# Patient Record
Sex: Male | Born: 1950 | Race: Asian | Hispanic: No | Marital: Married | Smoking: Never smoker
Health system: Southern US, Community
[De-identification: ages and names within clinical notes are randomized; demographics above are authoritative.]

---

## 2018-10-10 ENCOUNTER — Emergency Department (HOSPITAL_COMMUNITY): Payer: BC Managed Care – PPO

## 2018-10-10 ENCOUNTER — Encounter (HOSPITAL_COMMUNITY): Payer: Self-pay | Admitting: Emergency Medicine

## 2018-10-10 ENCOUNTER — Inpatient Hospital Stay (HOSPITAL_COMMUNITY)
Admission: EM | Admit: 2018-10-10 | Discharge: 2018-11-19 | DRG: 177 | Disposition: E | Payer: BC Managed Care – PPO | Attending: Internal Medicine | Admitting: Internal Medicine

## 2018-10-10 DIAGNOSIS — K5900 Constipation, unspecified: Secondary | ICD-10-CM | POA: Diagnosis not present

## 2018-10-10 DIAGNOSIS — Z23 Encounter for immunization: Secondary | ICD-10-CM

## 2018-10-10 DIAGNOSIS — F05 Delirium due to known physiological condition: Secondary | ICD-10-CM | POA: Diagnosis present

## 2018-10-10 DIAGNOSIS — U071 COVID-19: Secondary | ICD-10-CM | POA: Diagnosis not present

## 2018-10-10 DIAGNOSIS — R000 Tachycardia, unspecified: Secondary | ICD-10-CM | POA: Diagnosis not present

## 2018-10-10 DIAGNOSIS — E1165 Type 2 diabetes mellitus with hyperglycemia: Secondary | ICD-10-CM | POA: Diagnosis present

## 2018-10-10 DIAGNOSIS — J1289 Other viral pneumonia: Secondary | ICD-10-CM | POA: Diagnosis present

## 2018-10-10 DIAGNOSIS — J069 Acute upper respiratory infection, unspecified: Secondary | ICD-10-CM

## 2018-10-10 DIAGNOSIS — Z66 Do not resuscitate: Secondary | ICD-10-CM | POA: Diagnosis not present

## 2018-10-10 DIAGNOSIS — K6289 Other specified diseases of anus and rectum: Secondary | ICD-10-CM | POA: Diagnosis present

## 2018-10-10 DIAGNOSIS — F419 Anxiety disorder, unspecified: Secondary | ICD-10-CM | POA: Diagnosis present

## 2018-10-10 DIAGNOSIS — E11649 Type 2 diabetes mellitus with hypoglycemia without coma: Secondary | ICD-10-CM | POA: Diagnosis not present

## 2018-10-10 DIAGNOSIS — E43 Unspecified severe protein-calorie malnutrition: Secondary | ICD-10-CM | POA: Insufficient documentation

## 2018-10-10 DIAGNOSIS — E861 Hypovolemia: Secondary | ICD-10-CM | POA: Diagnosis present

## 2018-10-10 DIAGNOSIS — E871 Hypo-osmolality and hyponatremia: Secondary | ICD-10-CM | POA: Diagnosis present

## 2018-10-10 DIAGNOSIS — I5033 Acute on chronic diastolic (congestive) heart failure: Secondary | ICD-10-CM | POA: Diagnosis present

## 2018-10-10 DIAGNOSIS — E876 Hypokalemia: Secondary | ICD-10-CM | POA: Diagnosis present

## 2018-10-10 DIAGNOSIS — Z515 Encounter for palliative care: Secondary | ICD-10-CM | POA: Diagnosis not present

## 2018-10-10 DIAGNOSIS — Z6820 Body mass index (BMI) 20.0-20.9, adult: Secondary | ICD-10-CM

## 2018-10-10 DIAGNOSIS — E222 Syndrome of inappropriate secretion of antidiuretic hormone: Secondary | ICD-10-CM | POA: Diagnosis present

## 2018-10-10 DIAGNOSIS — R0600 Dyspnea, unspecified: Secondary | ICD-10-CM

## 2018-10-10 DIAGNOSIS — R0602 Shortness of breath: Secondary | ICD-10-CM

## 2018-10-10 DIAGNOSIS — R1084 Generalized abdominal pain: Secondary | ICD-10-CM

## 2018-10-10 DIAGNOSIS — R739 Hyperglycemia, unspecified: Secondary | ICD-10-CM | POA: Diagnosis present

## 2018-10-10 DIAGNOSIS — J80 Acute respiratory distress syndrome: Secondary | ICD-10-CM | POA: Diagnosis present

## 2018-10-10 LAB — COMPREHENSIVE METABOLIC PANEL
ALT: 19 U/L (ref 0–44)
AST: 22 U/L (ref 15–41)
Albumin: 3.8 g/dL (ref 3.5–5.0)
Alkaline Phosphatase: 62 U/L (ref 38–126)
Anion gap: 12 (ref 5–15)
BUN: 10 mg/dL (ref 8–23)
CO2: 22 mmol/L (ref 22–32)
Calcium: 8.8 mg/dL — ABNORMAL LOW (ref 8.9–10.3)
Chloride: 94 mmol/L — ABNORMAL LOW (ref 98–111)
Creatinine, Ser: 0.79 mg/dL (ref 0.61–1.24)
GFR calc Af Amer: >60 mL/min (ref >60)
GFR calc non Af Amer: >60 mL/min (ref >60)
Glucose, Bld: 311 mg/dL — ABNORMAL HIGH (ref 70–99)
Potassium: 3.5 mmol/L (ref 3.5–5.1)
Sodium: 128 mmol/L — ABNORMAL LOW (ref 135–145)
Total Bilirubin: 0.9 mg/dL (ref 0.3–1.2)
Total Protein: 7.3 g/dL (ref 6.5–8.1)

## 2018-10-10 LAB — CBC WITH DIFFERENTIAL/PLATELET
Abs Immature Granulocytes: 0.03 10*3/uL (ref 0.00–0.07)
Basophils Absolute: 0 10*3/uL (ref 0.0–0.1)
Basophils Relative: 0 %
Eosinophils Absolute: 0.1 10*3/uL (ref 0.0–0.5)
Eosinophils Relative: 1 %
HCT: 41.2 % (ref 39.0–52.0)
Hemoglobin: 13.9 g/dL (ref 13.0–17.0)
Immature Granulocytes: 0 %
Lymphocytes Relative: 10 %
Lymphs Abs: 1.2 10*3/uL (ref 0.7–4.0)
MCH: 30 pg (ref 26.0–34.0)
MCHC: 33.7 g/dL (ref 30.0–36.0)
MCV: 88.8 fL (ref 80.0–100.0)
Monocytes Absolute: 1 10*3/uL (ref 0.1–1.0)
Monocytes Relative: 8 %
Neutro Abs: 9.6 10*3/uL — ABNORMAL HIGH (ref 1.7–7.7)
Neutrophils Relative %: 81 %
Platelets: 183 10*3/uL (ref 150–400)
RBC: 4.64 MIL/uL (ref 4.22–5.81)
RDW: 13 % (ref 11.5–15.5)
WBC: 11.9 10*3/uL — ABNORMAL HIGH (ref 4.0–10.5)
nRBC: 0 % (ref 0.0–0.2)

## 2018-10-10 LAB — SARS CORONAVIRUS 2 BY RT PCR (HOSPITAL ORDER, PERFORMED IN ~~LOC~~ HOSPITAL LAB): SARS Coronavirus 2: POSITIVE — AB

## 2018-10-10 LAB — LACTIC ACID, PLASMA: Lactic Acid, Venous: 1.2 mmol/L (ref 0.5–1.9)

## 2018-10-10 MED ORDER — SODIUM CHLORIDE 0.9 % IV BOLUS
1000.0000 mL | Freq: Once | INTRAVENOUS | Status: AC
  Administered 2018-10-10: 23:00:00 1000 mL via INTRAVENOUS

## 2018-10-10 MED ORDER — ACETAMINOPHEN 500 MG PO TABS
1000.0000 mg | ORAL_TABLET | Freq: Once | ORAL | Status: AC
  Administered 2018-10-10: 21:00:00 1000 mg via ORAL
  Filled 2018-10-10: qty 2

## 2018-10-10 MED ORDER — SODIUM CHLORIDE 0.9% FLUSH: 3.0000 mL | Freq: Once | INTRAVENOUS | Status: DC

## 2018-10-10 NOTE — ED Notes (Signed)
EDP notified pt is positive for coronavirus

## 2018-10-10 NOTE — ED Provider Notes (Signed)
"Austin Page is a 68 y.o. male with no known past medical history who presents for evaluation of fever and cough x2-3 days.  Patient states that he is also had body aches, chills.  He states cough is not productive and denies any hemoptysis.  He states he has been taking some medication for fever but cannot recall the name of it.  Patient states he feels slightly better after taking medication but states fever returns and he feels more weak, tired.  He states he is not having any difficulty breathing.  He denies any history of COPD, asthma.  Patient states that he has a family member who was recently diagnosed with COVID-19.  He reports he has been around that family member.  He states that several of his family members are sick with similar symptoms.  Patient denies any chest pain, difficulty breathing, abdominal pain, nausea/vomiting, numbness/weakness of his arms or legs."  Austin Page was evaluated in Emergency Department on 10/11/2018 for the symptoms described in the history of present illness. He was evaluated in the context of the global COVID-19 pandemic, which necessitated consideration that the patient might be at risk for infection with the SARS-CoV-2 virus that causes COVID-19. Institutional protocols and algorithms that pertain to the evaluation of patients at risk for COVID-19 are in a state of rapid change based on information released by regulatory bodies including the CDC and federal and state organizations. These policies and algorithms were followed during the patient's care in the ED.  Physical Exam  BP (!) 151/90 (BP Location: Left Arm)   Pulse (!) 110   Temp 98.5 F (36.9 C) (Oral)   Resp 20   Ht 5\' 7"  (1.702 m)   Wt 72.6 kg   SpO2 95%   BMI 25.06 kg/m   Physical Exam Vitals signs and nursing note reviewed.  Constitutional:      General: He is not in acute distress.    Appearance: He is well-developed. He is not toxic-appearing or diaphoretic.  HENT:     Head:  Normocephalic and atraumatic.  Neck:     Musculoskeletal: Normal range of motion and neck supple.  Cardiovascular:     Rate and Rhythm: Tachycardia present.  Pulmonary:     Effort: Pulmonary effort is normal. Tachypnea present.  Abdominal:     Tenderness: There is no abdominal tenderness.  Skin:    General: Skin is warm and dry.     Capillary Refill: Capillary refill takes less than 2 seconds.  Neurological:     Mental Status: He is alert and oriented to person, place, and time.     Cranial Nerves: No cranial nerve deficit.  Psychiatric:        Behavior: Behavior normal.    ED Course/Procedures     Procedures  MDM   68 year old male with no known past medical history presenting for fever and cough for 2 to 3 days.  The patient was received a signout from [facility] pending fluid resuscitation and reevaluation.  Please see her note for further work-up and medical decision making.  The patient was febrile to 103.1 on arrival with a heart rate in the 130s.  Fever resolved, but the patient continued to remain tachycardic.  He was given 1 L of fluids with no improvement in his heart rate.  Recheck of rectal temp is normal, but tachycardia persists.  Given hypercoagulable state of COVID-19 with persistent tachycardia, PE study was obtained, which was negative for pulmonary embolism.  On re-evaluation, patient is tachycardic in the 120s with tachypnea although he denies shortness of breath when using a Hindi interpreter.  The patient was discussed with Dr. Clayborne DanaMesner, attending physician.  Given the patient's age and vital signs, will consult the hospitalist team for admission. Spoke with Dr. Antionette Charpyd who will admit. The patient appears reasonably stabilized for admission considering the current resources, flow, and capabilities available in the ED at this time, and I doubt any other [facility] requiring further screening and/or treatment in the ED prior to admission.     Barkley BoardsMcDonald, Mia A, PA-C 10/11/18  [id number]    Marily MemosMesner, Jason, MD 10/11/18 [phone]

## 2018-10-10 NOTE — Discharge Instructions (Signed)
Person Under Monitoring Name: Austin Page  Location: [address] [place] [address]   Infection Prevention Recommendations for Individuals Confirmed to have, or Being Evaluated for, 2019 Novel Coronavirus (COVID-19) Infection Who Receive Care at Home  Individuals who are confirmed to have, or are being evaluated for, COVID-19 should follow the prevention steps below until a healthcare provider or local or state health department says they can return to normal activities.  Stay home except to get medical care You should restrict activities outside your home, except for getting medical care. Do not go to work, school, or public areas, and do not use public transportation or taxis.  Call ahead before visiting your doctor Before your medical appointment, call the healthcare provider and tell them that you have, or are being evaluated for, COVID-19 infection. This will help the healthcare providers office take steps to keep other people from getting infected. Ask your healthcare provider to call the local or state health department.  Monitor your symptoms Seek prompt medical attention if your illness is worsening (e.g., difficulty breathing). Before going to your medical appointment, call the healthcare provider and tell them that you have, or are being evaluated for, COVID-19 infection. Ask your healthcare provider to call the local or state health department.  Wear a facemask You should wear a facemask that covers your nose and mouth when you are in the same room with other people and when you visit a healthcare provider. People who live with or visit you should also wear a facemask while they are in the same room with you.  Separate yourself from other people in your home As much as possible, you should stay in a different room from other people in your home. Also, you should use a separate bathroom, if available.  Avoid sharing household items You should not share  dishes, drinking glasses, cups, eating utensils, towels, bedding, or other items with other people in your home. After using these items, you should wash them thoroughly with soap and water.  Cover your coughs and sneezes Cover your mouth and nose with a tissue when you cough or sneeze, or you can cough or sneeze into your sleeve. Throw used tissues in a lined trash can, and immediately wash your hands with soap and water for at least 20 seconds or use an alcohol-based hand rub.  Wash your [employer] your hands often and thoroughly with soap and water for at least 20 seconds. You can use an alcohol-based hand sanitizer if soap and water are not available and if your hands are not visibly dirty. Avoid touching your eyes, nose, and mouth with unwashed hands.   Prevention Steps for Caregivers and Household Members of Individuals Confirmed to have, or Being Evaluated for, COVID-19 Infection Being Cared for in the Home  If you live with, or provide care at home for, a person confirmed to have, or being evaluated for, COVID-19 infection please follow these guidelines to prevent infection:  Follow healthcare providers instructions Make sure that you understand and can help the patient follow any healthcare provider instructions for all care.  Provide for the patients basic needs You should help the patient with basic needs in the home and provide support for getting groceries, prescriptions, and other personal needs.  Monitor the patients symptoms If they are getting sicker, call his or her medical provider and tell them that the patient has, or is being evaluated for, COVID-19 infection. This will help the healthcare providers office  take steps to keep other people from getting infected. Ask the healthcare provider to call the local or state health department.  Limit the number of people who have contact with the patient  If possible, have only one caregiver for the patient.  Other  household members should stay in another home or place of residence. If this is not possible, they should stay  in another room, or be separated from the patient as much as possible. Use a separate bathroom, if available.  Restrict visitors who do not have an essential need to be in the home.  Keep older adults, very young children, and other sick people away from the patient Keep older adults, very young children, and those who have compromised immune systems or chronic health conditions away from the patient. This includes people with chronic heart, lung, or kidney conditions, diabetes, and cancer.  Ensure good ventilation Make sure that shared spaces in the home have good air flow, such as from an air conditioner or an opened window, weather permitting.  Wash your hands often  Wash your hands often and thoroughly with soap and water for at least 20 seconds. You can use an alcohol based hand sanitizer if soap and water are not available and if your hands are not visibly dirty.  Avoid touching your eyes, nose, and mouth with unwashed hands.  Use disposable paper towels to dry your hands. If not available, use dedicated cloth towels and replace them when they become wet.  Wear a facemask and gloves  Wear a disposable facemask at all times in the room and gloves when you touch or have contact with the patients blood, body fluids, and/or secretions or excretions, such as sweat, saliva, sputum, nasal mucus, vomit, urine, or feces.  Ensure the mask fits over your nose and mouth tightly, and do not touch it during use.  Throw out disposable facemasks and gloves after using them. Do not reuse.  Wash your hands immediately after removing your facemask and gloves.  If your personal clothing becomes contaminated, carefully remove clothing and launder. Wash your hands after handling contaminated clothing.  Place all used disposable facemasks, gloves, and other waste in a lined container before  disposing them with other household waste.  Remove gloves and wash your hands immediately after handling these items.  Do not share dishes, glasses, or other household items with the patient  Avoid sharing household items. You should not share dishes, drinking glasses, cups, eating utensils, towels, bedding, or other items with a patient who is confirmed to have, or being evaluated for, COVID-19 infection.  After the person uses these items, you should wash them thoroughly with soap and water.  Wash laundry thoroughly  Immediately remove and wash clothes or bedding that have blood, body fluids, and/or secretions or excretions, such as sweat, saliva, sputum, nasal mucus, vomit, urine, or feces, on them.  Wear gloves when handling laundry from the patient.  Read and follow directions on labels of laundry or clothing items and detergent. In general, wash and dry with the warmest temperatures recommended on the label.  Clean all areas the individual has used often  Clean all touchable surfaces, such as counters, tabletops, doorknobs, bathroom fixtures, toilets, phones, keyboards, tablets, and bedside tables, every day. Also, clean any surfaces that may have blood, body fluids, and/or secretions or excretions on them.  Wear gloves when cleaning surfaces the patient has come in contact with.  Use a diluted bleach solution (e.g., dilute bleach with 1 part  bleach and 10 parts water) or a household disinfectant with a label that says EPA-registered for coronaviruses. To make a bleach solution at home, add 1 tablespoon of bleach to 1 quart (4 cups) of water. For a larger supply, add  cup of bleach to 1 gallon (16 cups) of water.  Read labels of cleaning products and follow recommendations provided on product labels. Labels contain instructions for safe and effective use of the cleaning product including precautions you should take when applying the product, such as wearing gloves or eye protection  and making sure you have good ventilation during use of the product.  Remove gloves and wash hands immediately after cleaning.  Monitor yourself for signs and symptoms of illness Caregivers and household members are considered close contacts, should monitor their health, and will be asked to limit movement outside of the home to the extent possible. Follow the monitoring steps for close contacts listed on the symptom monitoring form.   ? If you have additional questions, contact your local health department or call the epidemiologist on call at [phone] (available 24/7). ? This guidance is subject to change. For the most up-to-date guidance from [facility], please refer to their website: [email or web address]

## 2018-10-10 NOTE — ED Triage Notes (Signed)
Pt in POV with other family members, reports COVID like S/S X2 days. Cough/fever/chills. States they have been around a family member that passed away from COVID this AM. Tachycardic and febrile in triage.

## 2018-10-10 NOTE — ED Provider Notes (Signed)
[facility] EMERGENCY DEPARTMENT Provider Note   CSN: [id number] Arrival date & time: Oct 20, 2018  2009    History   Chief Complaint Chief Complaint  Patient presents with  . Cough    HPI Austin Page is a 68 y.o. male with no known past medical history who presents for evaluation of fever and cough x2-3 days.  Patient states that he is also had body aches, chills.  He states cough is not productive and denies any hemoptysis.  He states he has been taking some medication for fever but cannot recall the name of it.  Patient states he feels slightly better after taking medication but states fever returns and he feels more weak, tired.  He states he is not having any difficulty breathing.  He denies any history of COPD, asthma.  Patient states that he has a family member who was recently diagnosed with COVID-19.  He reports he has been around that family member.  He states that several of his family members are sick with similar symptoms.  Patient denies any chest pain, difficulty breathing, abdominal pain, nausea/vomiting, numbness/weakness of his arms or legs.     The history is provided by the patient. A language interpreter was used.    History reviewed. No pertinent past medical history.  Patient Active Problem List   Diagnosis Date Noted  . Acute respiratory disease due to COVID-19 virus 10/11/2018  . Hyperglycemia 10/11/2018  . Hyponatremia 10/11/2018    History reviewed. No pertinent surgical history.      Home Medications    Prior to Admission medications   Not on File    Family History History reviewed. No pertinent family history.  Social History Social History   Tobacco Use  . Smoking status: Never Smoker  . Smokeless tobacco: Never Used  Substance Use Topics  . Alcohol use: Not Currently  . Drug use: Not Currently     Allergies   Patient has no known allergies.   Review of Systems Review of Systems  Constitutional: Positive  for fatigue and fever.  Respiratory: Positive for cough. Negative for shortness of breath.   Cardiovascular: Negative for chest pain.  Gastrointestinal: Negative for abdominal pain, nausea and vomiting.  Genitourinary: Negative for dysuria and hematuria.  Musculoskeletal: Positive for myalgias.  Neurological: Negative for headaches.  All other systems reviewed and are negative.    Physical Exam Updated Vital Signs BP (!) 156/91 (BP Location: Left Arm)   Pulse (!) 131   Temp (!) 102.3 F (39.1 C) (Oral)   Resp (!) 33   Ht  (1.702 m)   Wt 72.6 kg   SpO2 91%   BMI 25.06 kg/m   Physical Exam Vitals signs and nursing note reviewed.  Constitutional:      Appearance: Normal appearance. He is well-developed.  HENT:     Head: Normocephalic and atraumatic.  Eyes:     General: Lids are normal.     Conjunctiva/sclera: Conjunctivae normal.     Pupils: Pupils are equal, round, and reactive to light.  Neck:     Musculoskeletal: Full passive range of motion without pain.  Cardiovascular:     Rate and Rhythm: Regular rhythm. Tachycardia present.     Pulses: Normal pulses.          Radial pulses are 2+ on the right side and 2+ on the left side.       Dorsalis pedis pulses are 2+ on the right side and 2+ on  the left side.     Heart sounds: Normal heart sounds. No murmur. No friction rub. No gallop.   Pulmonary:     Effort: Pulmonary effort is normal.     Breath sounds: Normal breath sounds.     Comments: Lungs clear to auscultation bilaterally.  Symmetric chest rise.  No wheezing, rales, rhonchi. Abdominal:     Palpations: Abdomen is soft. Abdomen is not rigid.     Tenderness: There is no abdominal tenderness. There is no guarding.     Comments: Abdomen is soft, non-distended, non-tender. No rigidity, No guarding. No peritoneal signs.  Musculoskeletal: Normal range of motion.     Comments: BLE are symmetric in appearance without any overlying warmth, erythema or edema.   Skin:     General: Skin is warm and dry.     Capillary Refill: Capillary refill takes less than 2 seconds.  Neurological:     Mental Status: He is alert and oriented to person, place, and time.  Psychiatric:        Speech: Speech normal.      ED Treatments / Results  Labs (all labs ordered are listed, but only abnormal results are displayed) Labs Reviewed  SARS CORONAVIRUS 2 (HOSPITAL ORDER, PERFORMED IN [facility]) - Abnormal; Notable for the following components:      Result Value   SARS Coronavirus 2 POSITIVE (*)    All other components within normal limits  COMPREHENSIVE METABOLIC PANEL - Abnormal; Notable for the following components:   Sodium 128 (*)    Chloride 94 (*)    Glucose, Bld 311 (*)    Calcium 8.8 (*)    All other components within normal limits  CBC WITH DIFFERENTIAL/PLATELET - Abnormal; Notable for the following components:   WBC 11.9 (*)    Neutro Abs 9.6 (*)    All other components within normal limits  LACTATE DEHYDROGENASE - Abnormal; Notable for the following components:   LDH 226 (*)    All other components within normal limits  C-REACTIVE PROTEIN - Abnormal; Notable for the following components:   CRP 10.8 (*)    All other components within normal limits  FIBRINOGEN - Abnormal; Notable for the following components:   Fibrinogen 547 (*)    All other components within normal limits  HEMOGLOBIN A1C - Abnormal; Notable for the following components:   Hgb A1c MFr Bld 10.7 (*)    All other components within normal limits  CBG MONITORING, ED - Abnormal; Notable for the following components:   Glucose-Capillary 216 (*)    All other components within normal limits  CULTURE, BLOOD (ROUTINE X 2)  CULTURE, BLOOD (ROUTINE X 2)  LACTIC ACID, PLASMA  PROCALCITONIN  D-DIMER, QUANTITATIVE (NOT AT [facility])  HIV ANTIBODY (ROUTINE TESTING W REFLEX)  INTERLEUKIN-6, PLASMA  SODIUM, URINE, RANDOM  OSMOLALITY, URINE  ABO/RH    EKG EKG Interpretation   Date/Time:  Saturday Oct 11 2018 05:01:00 EDT Ventricular Rate:  118 PR Interval:    QRS Duration: 83 QT Interval:  321 QTC Calculation: 450 R Axis:   -51 Text Interpretation:  Sinus tachycardia Left anterior fascicular block Abnormal R-wave progression, late transition Left ventricular hypertrophy No old tracing to compare Confirmed by Marily Memos [phone]) on 10/11/2018 5:05:49 AM Also confirmed by Marily Memos [phone]), editor Elita Quick [phone])  on 10/11/2018 1:46:01 PM   Radiology Ct Angio Chest Pe W And/or Wo Contrast  Result Date: 10/11/2018 CLINICAL DATA:  68 y/o  M; cough, fever,  chills.  COVID-19 positive. EXAM: CT ANGIOGRAPHY CHEST WITH CONTRAST TECHNIQUE: Multidetector CT imaging of the chest was performed using the standard protocol during bolus administration of intravenous contrast. Multiplanar CT image reconstructions and MIPs were obtained to evaluate the vascular anatomy. CONTRAST:  OMNIPAQUE IOHEXOL 300 MG/ML  SOLN COMPARISON:  10/08/2018 chest radiograph FINDINGS: Cardiovascular: Mild respiratory motion artifact. Satisfactory opacification of the pulmonary arteries to the segmental level. No evidence of pulmonary embolism. Normal heart size. No pericardial effusion. Mild aortic and moderate coronary artery calcific atherosclerosis. Mediastinum/Nodes: No enlarged mediastinal, hilar, or axillary lymph nodes. Thyroid gland, trachea, and esophagus demonstrate no significant findings. Lungs/Pleura: Multiple peripheral ground-glass opacities with the largest confluent opacity in the right upper lobe. The right upper lobe demonstrates intra lobular septal thickening "crazy paving" pattern. No pleural effusion or pneumothorax. Upper Abdomen: No acute abnormality. Musculoskeletal: No chest wall abnormality. No acute or significant osseous findings. Review of the MIP images confirms the above findings. IMPRESSION: 1. Mild respiratory motion artifact. No pulmonary embolus  identified. 2. There are a spectrum of findings in the lungs which can be seen with acute atypical infection (as well as other non-infectious etiologies). In particular, viral pneumonia (including COVID-19) should be considered in the appropriate clinical setting. Critical Value/emergent results were called by telephone at the time of interpretation on 10/11/2018 at 3:08 am to PA MIA [facility] , who verbally acknowledged these results. Electronically Signed   By: Mitzi Hansen M.D.   On: 10/11/2018 03:21   Dg Chest Portable 1 View  Result Date: 09/23/2018 CLINICAL DATA:  Symptoms of possible COVID-19 EXAM: PORTABLE CHEST 1 VIEW COMPARISON:  None. FINDINGS: The heart size and mediastinal contours are within normal limits. Both lungs are clear. The visualized skeletal structures are unremarkable. IMPRESSION: No active disease. Electronically Signed   By: Deatra Robinson M.D.   On: 09/19/2018 22:55    Procedures Procedures (including critical care time)  Medications Ordered in ED Medications  sodium chloride flush (NS) 0.9 % injection 3 mL (3 mLs Intravenous Not Given 10/15/2018 2145)  enoxaparin (LOVENOX) injection 40 mg (has no administration in time range)  sodium chloride flush (NS) 0.9 % injection 3 mL (has no administration in time range)  sodium chloride flush (NS) 0.9 % injection 3 mL (has no administration in time range)  sodium chloride flush (NS) 0.9 % injection 3 mL (has no administration in time range)  0.9 %  sodium chloride infusion (has no administration in time range)  acetaminophen (TYLENOL) tablet 650 mg (650 mg Oral Given 10/11/18 1412)  HYDROcodone-acetaminophen (NORCO/VICODIN) 5-325 MG per tablet 1 tablet (1 tablet Oral Given 10/11/18 0533)  senna-docusate (Senokot-S) tablet 1 tablet (has no administration in time range)  ondansetron (ZOFRAN) tablet 4 mg ( Oral See Alternative 10/11/18 0533)    Or  ondansetron (ZOFRAN) injection 4 mg (4 mg Intravenous Given 10/11/18 0533)   insulin aspart (novoLOG) injection 0-9 Units (0 Units Subcutaneous Not Given 10/11/18 1603)  insulin aspart (novoLOG) injection 0-5 Units (has no administration in time range)  acetaminophen (TYLENOL) tablet 1,000 mg (1,000 mg Oral Given 09/21/2018 2045)  sodium chloride 0.9 % bolus 1,000 mL (0 mLs Intravenous Stopped 10/11/18 0153)  iohexol (OMNIPAQUE) 300 MG/ML solution 100 mL (100 mLs Intravenous Contrast Given 10/11/18 0257)  sodium chloride 0.9 % bolus 500 mL (0 mLs Intravenous Stopped 10/11/18 0827)     Initial Impression / Assessment and Plan / ED Course  I have reviewed the triage vital signs and the nursing  notes.  Pertinent labs & imaging results that were available during my care of the patient were reviewed by me and considered in my medical decision making (see chart for details).        68 year old male no significant past medical history who presents for evaluation of fevers and cough.  Reports recent COVID exposure.  No difficulty breathing.  No chest pain.  On initial ED arrival, he is febrile, tachycardic, hypertensive.  Lungs clear to auscultation.  No evidence of respiratory distress.  Clinically, concern for COVID versus infectious etiology.  Lactic acid is negative.  CBC shows leukocytosis of 11.9.  CMP shows sodium of 128, glucose of 311. Patient with no history of diabetes.  He is COVID positive.  Chest x-ray negative for any acute abnormality.  Updated patient on results. He denies any SOB.  Temperature has improved here in ED.  Tachycardia has improved he still slightly tachycardic. He has only received about half a liter of fluids.  Patient will receive rest of fluids and reassess.   Patient signed out to Frederik PearMia McDonald, PA-C with re-assessment pending. Please see her note for further ED course.   Portions of this note were generated with Scientist, clinical (histocompatibility and immunogenetics)Dragon dictation software. Dictation errors may occur despite best attempts at proofreading.    Final Clinical Impressions(s) / ED  Diagnoses   Final diagnoses:  COVID-19    ED Discharge Orders    None       Maxwell CaulLayden, Jaeline Whobrey A, PA-C 10/11/18 1607    Shaune PollackIsaacs, Cameron, MD 10/13/18 1024

## 2018-10-11 ENCOUNTER — Other Ambulatory Visit: Payer: Self-pay

## 2018-10-11 ENCOUNTER — Emergency Department (HOSPITAL_COMMUNITY): Payer: BC Managed Care – PPO

## 2018-10-11 ENCOUNTER — Encounter (HOSPITAL_COMMUNITY): Payer: Self-pay | Admitting: Family Medicine

## 2018-10-11 DIAGNOSIS — J069 Acute upper respiratory infection, unspecified: Secondary | ICD-10-CM | POA: Diagnosis not present

## 2018-10-11 DIAGNOSIS — I5033 Acute on chronic diastolic (congestive) heart failure: Secondary | ICD-10-CM | POA: Diagnosis not present

## 2018-10-11 DIAGNOSIS — R739 Hyperglycemia, unspecified: Secondary | ICD-10-CM | POA: Diagnosis not present

## 2018-10-11 DIAGNOSIS — E871 Hypo-osmolality and hyponatremia: Secondary | ICD-10-CM | POA: Diagnosis not present

## 2018-10-11 DIAGNOSIS — J80 Acute respiratory distress syndrome: Secondary | ICD-10-CM | POA: Diagnosis present

## 2018-10-11 DIAGNOSIS — F05 Delirium due to known physiological condition: Secondary | ICD-10-CM | POA: Diagnosis present

## 2018-10-11 DIAGNOSIS — U071 COVID-19: Secondary | ICD-10-CM | POA: Diagnosis present

## 2018-10-11 DIAGNOSIS — Z66 Do not resuscitate: Secondary | ICD-10-CM | POA: Diagnosis not present

## 2018-10-11 DIAGNOSIS — R0600 Dyspnea, unspecified: Secondary | ICD-10-CM | POA: Diagnosis not present

## 2018-10-11 DIAGNOSIS — Z23 Encounter for immunization: Secondary | ICD-10-CM | POA: Diagnosis not present

## 2018-10-11 DIAGNOSIS — E222 Syndrome of inappropriate secretion of antidiuretic hormone: Secondary | ICD-10-CM | POA: Diagnosis present

## 2018-10-11 DIAGNOSIS — E861 Hypovolemia: Secondary | ICD-10-CM | POA: Diagnosis not present

## 2018-10-11 DIAGNOSIS — J988 Other specified respiratory disorders: Secondary | ICD-10-CM | POA: Diagnosis not present

## 2018-10-11 DIAGNOSIS — F419 Anxiety disorder, unspecified: Secondary | ICD-10-CM | POA: Diagnosis not present

## 2018-10-11 DIAGNOSIS — J1289 Other viral pneumonia: Secondary | ICD-10-CM | POA: Diagnosis not present

## 2018-10-11 DIAGNOSIS — K6289 Other specified diseases of anus and rectum: Secondary | ICD-10-CM | POA: Diagnosis not present

## 2018-10-11 DIAGNOSIS — Z515 Encounter for palliative care: Secondary | ICD-10-CM | POA: Diagnosis not present

## 2018-10-11 DIAGNOSIS — K5900 Constipation, unspecified: Secondary | ICD-10-CM | POA: Diagnosis not present

## 2018-10-11 DIAGNOSIS — Z6820 Body mass index (BMI) 20.0-20.9, adult: Secondary | ICD-10-CM | POA: Diagnosis not present

## 2018-10-11 DIAGNOSIS — J690 Pneumonitis due to inhalation of food and vomit: Secondary | ICD-10-CM | POA: Diagnosis not present

## 2018-10-11 DIAGNOSIS — E1165 Type 2 diabetes mellitus with hyperglycemia: Secondary | ICD-10-CM | POA: Diagnosis not present

## 2018-10-11 DIAGNOSIS — E43 Unspecified severe protein-calorie malnutrition: Secondary | ICD-10-CM | POA: Diagnosis present

## 2018-10-11 DIAGNOSIS — E11649 Type 2 diabetes mellitus with hypoglycemia without coma: Secondary | ICD-10-CM | POA: Diagnosis not present

## 2018-10-11 DIAGNOSIS — E876 Hypokalemia: Secondary | ICD-10-CM | POA: Diagnosis not present

## 2018-10-11 DIAGNOSIS — R000 Tachycardia, unspecified: Secondary | ICD-10-CM | POA: Diagnosis not present

## 2018-10-11 LAB — COMPREHENSIVE METABOLIC PANEL
ALT: 20 U/L (ref 0–44)
AST: 26 U/L (ref 15–41)
Albumin: 3.2 g/dL — ABNORMAL LOW (ref 3.5–5.0)
Alkaline Phosphatase: 57 U/L (ref 38–126)
Anion gap: 9 (ref 5–15)
BUN: 18 mg/dL (ref 8–23)
CO2: 22 mmol/L (ref 22–32)
Calcium: 8.5 mg/dL — ABNORMAL LOW (ref 8.9–10.3)
Chloride: 98 mmol/L (ref 98–111)
Creatinine, Ser: 0.83 mg/dL (ref 0.61–1.24)
GFR calc Af Amer: >60 mL/min (ref >60)
GFR calc non Af Amer: >60 mL/min (ref >60)
Glucose, Bld: 375 mg/dL — ABNORMAL HIGH (ref 70–99)
Potassium: 4 mmol/L (ref 3.5–5.1)
Sodium: 129 mmol/L — ABNORMAL LOW (ref 135–145)
Total Bilirubin: 0.6 mg/dL (ref 0.3–1.2)
Total Protein: 6.6 g/dL (ref 6.5–8.1)

## 2018-10-11 LAB — D-DIMER, QUANTITATIVE: D-Dimer, Quant: 0.46 ug/mL-FEU (ref 0.00–0.50)

## 2018-10-11 LAB — LIPID PANEL
Cholesterol: 147 mg/dL (ref 0–200)
HDL: 34 mg/dL — ABNORMAL LOW (ref >40)
LDL Cholesterol: 96 mg/dL (ref 0–99)
Total CHOL/HDL Ratio: 4.3 RATIO
Triglycerides: 86 mg/dL (ref <150)
VLDL: 17 mg/dL (ref 0–40)

## 2018-10-11 LAB — HIV ANTIBODY (ROUTINE TESTING W REFLEX): HIV Screen 4th Generation wRfx: NONREACTIVE

## 2018-10-11 LAB — CBG MONITORING, ED: Glucose-Capillary: 216 mg/dL — ABNORMAL HIGH (ref 70–99)

## 2018-10-11 LAB — ABO/RH: ABO/RH(D): B POS

## 2018-10-11 LAB — GLUCOSE, CAPILLARY
Glucose-Capillary: 236 mg/dL — ABNORMAL HIGH (ref 70–99)
Glucose-Capillary: 285 mg/dL — ABNORMAL HIGH (ref 70–99)

## 2018-10-11 LAB — FIBRINOGEN: Fibrinogen: 547 mg/dL — ABNORMAL HIGH (ref 210–475)

## 2018-10-11 LAB — PROCALCITONIN: Procalcitonin: 0.12 ng/mL

## 2018-10-11 LAB — HEMOGLOBIN A1C
Hgb A1c MFr Bld: 10.7 % — ABNORMAL HIGH (ref 4.8–5.6)
Mean Plasma Glucose: 260.39 mg/dL

## 2018-10-11 LAB — LACTATE DEHYDROGENASE: LDH: 226 U/L — ABNORMAL HIGH (ref 98–192)

## 2018-10-11 LAB — MAGNESIUM: Magnesium: 1.8 mg/dL (ref 1.7–2.4)

## 2018-10-11 LAB — C-REACTIVE PROTEIN: CRP: 10.8 mg/dL — ABNORMAL HIGH (ref <1.0)

## 2018-10-11 MED ORDER — SENNOSIDES-DOCUSATE SODIUM 8.6-50 MG PO TABS
1.0000 | ORAL_TABLET | Freq: Every evening | ORAL | Status: DC | PRN
  Administered 2018-10-31 – 2018-11-01 (×2): 1 via ORAL
  Filled 2018-10-11 (×2): qty 1

## 2018-10-11 MED ORDER — SODIUM CHLORIDE 0.9% FLUSH: 3.0000 mL | Freq: Two times a day (BID) | INTRAVENOUS | Status: DC

## 2018-10-11 MED ORDER — SODIUM CHLORIDE 0.9% FLUSH: 3.0000 mL | INTRAVENOUS | Status: DC | PRN

## 2018-10-11 MED ORDER — SODIUM CHLORIDE 0.9 % IV SOLN
INTRAVENOUS | Status: DC
  Administered 2018-10-11 – 2018-10-12 (×2): via INTRAVENOUS

## 2018-10-11 MED ORDER — INSULIN ASPART 100 UNIT/ML ~~LOC~~ SOLN
0.0000 [IU] | Freq: Every day | SUBCUTANEOUS | Status: DC
  Administered 2018-10-11: 5 [IU] via SUBCUTANEOUS

## 2018-10-11 MED ORDER — ENOXAPARIN SODIUM 40 MG/0.4ML ~~LOC~~ SOLN
40.0000 mg | SUBCUTANEOUS | Status: DC
  Administered 2018-10-11 – 2018-10-12 (×2): 40 mg via SUBCUTANEOUS
  Filled 2018-10-11 (×2): qty 0.4

## 2018-10-11 MED ORDER — PNEUMOCOCCAL VAC POLYVALENT 25 MCG/0.5ML IJ INJ
0.5000 mL | INJECTION | INTRAMUSCULAR | Status: AC
  Administered 2018-10-12: 10:00:00 0.5 mL via INTRAMUSCULAR
  Filled 2018-10-11: qty 0.5

## 2018-10-11 MED ORDER — INSULIN ASPART 100 UNIT/ML ~~LOC~~ SOLN
0.0000 [IU] | SUBCUTANEOUS | Status: DC
  Administered 2018-10-11: 22:00:00 0 [IU] via SUBCUTANEOUS
  Administered 2018-10-12 (×2): 5 [IU] via SUBCUTANEOUS
  Administered 2018-10-12: 16:00:00 11 [IU] via SUBCUTANEOUS
  Administered 2018-10-12: 8 [IU] via SUBCUTANEOUS
  Administered 2018-10-12: 09:00:00 3 [IU] via SUBCUTANEOUS
  Administered 2018-10-12: 5 [IU] via SUBCUTANEOUS
  Administered 2018-10-13 (×3): 3 [IU] via SUBCUTANEOUS
  Administered 2018-10-13: 21:00:00 5 [IU] via SUBCUTANEOUS
  Administered 2018-10-13: 23:00:00 8 [IU] via SUBCUTANEOUS
  Administered 2018-10-14 (×2): 5 [IU] via SUBCUTANEOUS
  Administered 2018-10-14: 17:00:00 8 [IU] via SUBCUTANEOUS
  Administered 2018-10-14: 3 [IU] via SUBCUTANEOUS
  Administered 2018-10-14: 20:00:00 11 [IU] via SUBCUTANEOUS
  Administered 2018-10-14: 12:00:00 8 [IU] via SUBCUTANEOUS
  Administered 2018-10-15: 16:00:00 5 [IU] via SUBCUTANEOUS
  Administered 2018-10-15 (×2): 3 [IU] via SUBCUTANEOUS
  Administered 2018-10-15: 11 [IU] via SUBCUTANEOUS
  Administered 2018-10-15: 12:00:00 8 [IU] via SUBCUTANEOUS
  Administered 2018-10-16: 3 [IU] via SUBCUTANEOUS
  Administered 2018-10-16: 12:00:00 5 [IU] via SUBCUTANEOUS
  Administered 2018-10-16 (×2): 2 [IU] via SUBCUTANEOUS
  Administered 2018-10-16: 20:00:00 15 [IU] via SUBCUTANEOUS
  Administered 2018-10-16: 3 [IU] via SUBCUTANEOUS

## 2018-10-11 MED ORDER — INSULIN ASPART 100 UNIT/ML ~~LOC~~ SOLN
0.0000 [IU] | Freq: Three times a day (TID) | SUBCUTANEOUS | Status: DC
  Administered 2018-10-11: 17:00:00 3 [IU] via SUBCUTANEOUS

## 2018-10-11 MED ORDER — SODIUM CHLORIDE 0.9% FLUSH
3.0000 mL | Freq: Two times a day (BID) | INTRAVENOUS | Status: DC
  Administered 2018-10-11 – 2018-10-20 (×19): 3 mL via INTRAVENOUS

## 2018-10-11 MED ORDER — HYDROCODONE-ACETAMINOPHEN 5-325 MG PO TABS
1.0000 | ORAL_TABLET | ORAL | Status: DC | PRN
  Administered 2018-10-11 – 2018-10-24 (×6): 1 via ORAL
  Filled 2018-10-11 (×6): qty 1

## 2018-10-11 MED ORDER — SODIUM CHLORIDE 0.9 % IV BOLUS
500.0000 mL | Freq: Once | INTRAVENOUS | Status: AC
  Administered 2018-10-11: 500 mL via INTRAVENOUS

## 2018-10-11 MED ORDER — ONDANSETRON HCL 4 MG PO TABS: 4.0000 mg | ORAL_TABLET | Freq: Four times a day (QID) | ORAL | Status: DC | PRN

## 2018-10-11 MED ORDER — IOHEXOL 300 MG/ML  SOLN
100.0000 mL | Freq: Once | INTRAMUSCULAR | Status: AC | PRN
  Administered 2018-10-11: 100 mL via INTRAVENOUS

## 2018-10-11 MED ORDER — METOPROLOL TARTRATE 5 MG/5ML IV SOLN
2.5000 mg | Freq: Four times a day (QID) | INTRAVENOUS | Status: DC
  Administered 2018-10-11 – 2018-10-12 (×3): 2.5 mg via INTRAVENOUS
  Filled 2018-10-11 (×3): qty 5

## 2018-10-11 MED ORDER — ACETAMINOPHEN 325 MG PO TABS
650.0000 mg | ORAL_TABLET | Freq: Four times a day (QID) | ORAL | Status: DC | PRN
  Administered 2018-10-11 – 2018-10-12 (×6): 650 mg via ORAL
  Filled 2018-10-11 (×7): qty 2

## 2018-10-11 MED ORDER — ENOXAPARIN SODIUM 40 MG/0.4ML ~~LOC~~ SOLN
40.0000 mg | SUBCUTANEOUS | Status: DC
  Filled 2018-10-11: qty 0.4

## 2018-10-11 MED ORDER — SODIUM CHLORIDE 0.9 % IV SOLN
250.0000 mL | INTRAVENOUS | Status: DC | PRN
  Administered 2018-10-15: 15:00:00 250 mL via INTRAVENOUS

## 2018-10-11 MED ORDER — ONDANSETRON HCL 4 MG/2ML IJ SOLN
4.0000 mg | Freq: Four times a day (QID) | INTRAMUSCULAR | Status: DC | PRN
  Administered 2018-10-11 – 2018-10-26 (×2): 4 mg via INTRAVENOUS
  Filled 2018-10-11 (×3): qty 2

## 2018-10-11 NOTE — ED Notes (Signed)
Breakfast tray ordered for pt

## 2018-10-11 NOTE — H&P (Signed)
History and Physical    Austin Page FPO:[id number] DOB: 1950-08-31 DOA: 09/23/2018  PCP: Patient, [name]   Patient coming from: Home   Chief Complaint: Fever, cough, N/V, gen weakness   HPI: Austin Page is a 68 y.o. male who denies any significant past medical history and now presents for evaluation of fevers, chills, cough, nausea with nonbloody vomiting, and generalized weakness.  Patient has had contact with a family member with confirmed COVID-19 and reports approximately 3 days of fevers, chills, nonproductive cough, general malaise, and nausea with nonbloody vomiting.  He denies any chest pain, leg swelling or tenderness, or rash.  He has taken some antipyretics at home without much relief.  ED Course: Upon arrival to the ED, patient is found to be febrile to 39.5 C, saturating mid 90s on room air, tachypneic in the mid 30s, tachycardic in the 130s, and with stable blood pressure.  Chest x-ray is negative for acute cardiopulmonary disease but CTA chest, while negative for PE, demonstrates multiple peripheral groundglass opacities concerning for viral pneumonia in this setting.  Chemistry panel features a glucose of 311 and sodium 128.  CBC is notable for a leukocytosis to 11,900.  COVID-19 testing is positive.  Lactic acid is reassuringly normal.  Patient was given a liter of normal saline and acetaminophen in the ED.  He has not been hypoxic, but has increased work of breathing and tachypnea in the 30s while at rest, remains tachycardic despite fluid resuscitation and antipyretics, and he will be admitted for ongoing evaluation and  Review of Systems:  All other systems reviewed and apart from HPI, are negative.  History reviewed. No pertinent past medical history.  History reviewed. No pertinent surgical history.   reports that he has never smoked. He has never used smokeless tobacco. He reports previous alcohol use. He reports previous drug use.  No Known Allergies   History reviewed. No pertinent family history.   Prior to Admission medications   Not on File    Physical Exam: Vitals:   10/11/18 0215 10/11/18 0230 10/11/18 0500 10/11/18 0501  BP: (!) 153/94 (!) 158/94 (!) 163/90 (!) 163/90  Pulse: (!) 108  (!) 119 (!) 120  Resp: (!) 28 (!) 27 (!) 25 (!) 31  Temp:    (!) 102.1 F (38.9 C)  TempSrc:    Oral  SpO2: 97%  95% 94%  Weight:      Height:        Constitutional: Tachypneic, appears uncomfortable, no diaphoresis   Eyes: PERTLA, lids and conjunctivae normal ENMT: Mucous membranes are moist. Posterior pharynx clear of any exudate or lesions.   Neck: normal, supple, no masses, no thyromegaly Respiratory: Coarse rales bilaterally. Tachypnea. Dyspnea with speech. Increased WOB. No pallor or cyanosis.   Cardiovascular: Rate ~125 and regular. No extremity edema.  Abdomen: No distension, no tenderness, soft. Bowel sounds active.  Musculoskeletal: no clubbing / cyanosis. No joint deformity upper and lower extremities.   Skin: no significant rashes, lesions, ulcers. Warm, dry, well-perfused. Neurologic: CN 2-12 grossly intact. Sensation intact. Moving all extremities.  Psychiatric: Alert and oriented to person, place, and situation. Cooperative.    Labs on Admission: I have personally reviewed following labs and imaging studies  CBC: Recent Labs  Lab 09/19/2018 2039  WBC 11.9*  NEUTROABS 9.6*  HGB 13.9  HCT 41.2  MCV 88.8  PLT 183   Basic Metabolic Panel: Recent Labs  Lab 10/17/2018 2039  NA 128*  K 3.5  CL 94*  CO2 22  GLUCOSE 311*  BUN 10  CREATININE 0.79  CALCIUM 8.8*   GFR: Estimated Creatinine Clearance: 83.8 mL/min (by C-G formula based on SCr of 0.79 mg/dL). Liver Function Tests: Recent Labs  Lab 11-02-18 2039  AST 22  ALT 19  ALKPHOS 62  BILITOT 0.9  PROT 7.3  ALBUMIN 3.8   No results for input(s): LIPASE, AMYLASE in the last 168 hours. No results for input(s): AMMONIA in the last 168 hours. Coagulation  Profile: No results for input(s): INR, PROTIME in the last 168 hours. Cardiac Enzymes: No results for input(s): CKTOTAL, CKMB, CKMBINDEX, TROPONINI in the last 168 hours. BNP (last 3 results) No results for input(s): PROBNP in the last 8760 hours. HbA1C: No results for input(s): HGBA1C in the last 72 hours. CBG: No results for input(s): GLUCAP in the last 168 hours. Lipid Profile: No results for input(s): CHOL, HDL, LDLCALC, TRIG, CHOLHDL, LDLDIRECT in the last 72 hours. Thyroid Function Tests: No results for input(s): TSH, T4TOTAL, FREET4, T3FREE, THYROIDAB in the last 72 hours. Anemia Panel: No results for input(s): VITAMINB12, FOLATE, FERRITIN, TIBC, IRON, RETICCTPCT in the last 72 hours. Urine analysis: No results found for: COLORURINE, APPEARANCEUR, LABSPEC, PHURINE, GLUCOSEU, HGBUR, BILIRUBINUR, KETONESUR, PROTEINUR, UROBILINOGEN, NITRITE, LEUKOCYTESUR Sepsis Labs: (procalcitonin:4,lacticidven:4) ) Recent Results (from the past 240 hour(s))  SARS Coronavirus 2 (CEPHEID- Performed in [facility] hospital lab), Hosp Order     Status: Abnormal   Collection Time: 2018-11-02  8:44 PM  Result Value Ref Range Status   SARS Coronavirus 2 POSITIVE (A) NEGATIVE Final    Comment: RESULT CALLED TO, READ BACK BY AND VERIFIED WITH: L CHILTON RN 2018-11-02 2232 JDW (NOTE) If result is NEGATIVE SARS-CoV-2 target nucleic acids are NOT DETECTED. The SARS-CoV-2 RNA is generally detectable in upper and lower  respiratory specimens during the acute phase of infection. The lowest  concentration of SARS-CoV-2 viral copies this assay can detect is 250  copies / mL. A negative result does not preclude SARS-CoV-2 infection  and should not be used as the sole basis for treatment or other  patient management decisions.  A negative result may occur with  improper specimen collection / handling, submission of specimen other  than nasopharyngeal swab, presence of viral mutation(s) within the   areas targeted by this assay, and inadequate number of viral copies  (<250 copies / mL). A negative result must be combined with clinical  observations, patient history, and epidemiological information. If result is POSITIVE SARS-CoV-2 target nucleic acids are DETECTED. The SAR S-CoV-2 RNA is generally detectable in upper and lower  respiratory specimens during the acute phase of infection.  Positive  results are indicative of active infection with SARS-CoV-2.  Clinical  correlation with patient history and other diagnostic information is  necessary to determine patient infection status.  Positive results do  not rule out bacterial infection or co-infection with other viruses. If result is PRESUMPTIVE POSTIVE SARS-CoV-2 nucleic acids MAY BE PRESENT.   A presumptive positive result was obtained on the submitted specimen  and confirmed on repeat testing.  While 2019 novel coronavirus  (SARS-CoV-2) nucleic acids may be present in the submitted sample  additional confirmatory testing may be necessary for epidemiological  and / or clinical management purposes  to differentiate between  SARS-CoV-2 and other Sarbecovirus currently known to infect humans.  If clinically indicated additional testing with an alternate test  methodology [id number]) is advis ed. The SARS-CoV-2 RNA is generally  detectable in upper  and lower respiratory specimens during the acute  phase of infection. The expected result is Negative. Fact Sheet for Patients:  BoilerBrush.com.cy Fact Sheet for Healthcare Providers: https://pope.com/ This test is not yet approved or cleared by the [place] FDA and has been authorized for detection and/or diagnosis of SARS-CoV-2 by FDA under an Emergency Use Authorization (EUA).  This EUA will remain in effect (meaning this test can be used) for the duration of the COVID-19 declaration under Section 564(b)(1) of the Act, 21 U.S.C.  section 360bbb-3(b)(1), unless the authorization is terminated or revoked sooner. Performed at [facility], [address], [place], [place] [address]      Radiological Exams on Admission: Ct Angio Chest Pe W And/or Wo Contrast  Result Date: 10/11/2018 CLINICAL DATA:  68 y/o  M; cough, fever, chills.  COVID-19 positive. EXAM: CT ANGIOGRAPHY CHEST WITH CONTRAST TECHNIQUE: Multidetector CT imaging of the chest was performed using the standard protocol during bolus administration of intravenous contrast. Multiplanar CT image reconstructions and MIPs were obtained to evaluate the vascular anatomy. CONTRAST:  OMNIPAQUE IOHEXOL 300 MG/ML  SOLN COMPARISON:  10/02/2018 chest radiograph FINDINGS: Cardiovascular: Mild respiratory motion artifact. Satisfactory opacification of the pulmonary arteries to the segmental level. No evidence of pulmonary embolism. Normal heart size. No pericardial effusion. Mild aortic and moderate coronary artery calcific atherosclerosis. Mediastinum/Nodes: No enlarged mediastinal, hilar, or axillary lymph nodes. Thyroid gland, trachea, and esophagus demonstrate no significant findings. Lungs/Pleura: Multiple peripheral ground-glass opacities with the largest confluent opacity in the right upper lobe. The right upper lobe demonstrates intra lobular septal thickening "crazy paving" pattern. No pleural effusion or pneumothorax. Upper Abdomen: No acute abnormality. Musculoskeletal: No chest wall abnormality. No acute or significant osseous findings. Review of the MIP images confirms the above findings. IMPRESSION: 1. Mild respiratory motion artifact. No pulmonary embolus identified. 2. There are a spectrum of findings in the lungs which can be seen with acute atypical infection (as well as other non-infectious etiologies). In particular, viral pneumonia (including COVID-19) should be considered in the appropriate clinical setting. Critical Value/emergent results were called by  telephone at the time of interpretation on 10/11/2018 at 3:08 am to PA MIA [facility] , who verbally acknowledged these results. Electronically Signed   By: Mitzi Hansen M.D.   On: 10/11/2018 03:21   Dg Chest Portable 1 View  Result Date: 09/21/2018 CLINICAL DATA:  Symptoms of possible COVID-19 EXAM: PORTABLE CHEST 1 VIEW COMPARISON:  None. FINDINGS: The heart size and mediastinal contours are within normal limits. Both lungs are clear. The visualized skeletal structures are unremarkable. IMPRESSION: No active disease. Electronically Signed   By: Deatra Robinson M.D.   On: 09/28/2018 22:55    EKG: Independently reviewed. Sinus tachycardia (rate 118), LAFB, poor R-wave progression.   Assessment/Plan   1. Respiratory distress; COVID-19  - Presents with 3 days of fever, chills, cough, malaise, N/V, and is found to be febrile, tachycardic, and tachypneic - ED workup confirms COVID-19 and while he is not requiring supplemental O2 in ED, he is tachypneic in mid-30's and tachycardic to 130 while at rest despite antipyretics and IV fluid-resuscitation  - CRP, d-dimer, procalcitonin, IL-6, LDH, fibrinogen are pending  - Continue supportive care, droplet & contact precautions, and follow-up pending labs    2. Hyperglycemia  - Serum glucose is 311 on admission  - Undiagnosed DM vs secondary to acute illness  - Check A1c, use SSI with Novolog    3. Hyponatremia  - Serum sodium  is 128 in setting of hyperglycemia and hypovolemia  - He has received 1.5 liters NS in ED and hyperglycemia will be addressed as above  - Repeat chem panel in am    PPE: Mask, face shield, gloves, gown. Patient wearing mask DVT prophylaxis: Lovenox  Code Status: Full  Family Communication: Discussed with patient  Consults called: None  Admission status: Inpatient     Briscoe Deutscherimothy S Shakirah Kirkey, MD [facility] Pager [phone]  If 7PM-7AM, please contact night-coverage [email or web address] Password [email or web address]  10/11/2018,  5:19 AM

## 2018-10-11 NOTE — Plan of Care (Signed)
Pt progressing

## 2018-10-11 NOTE — ED Notes (Signed)
Called care link to get an update regarding pt transport to [facility]. Spoke with [employer]

## 2018-10-11 NOTE — Progress Notes (Signed)
RN called patients daughter @ [phone] and gave a clinical update and how patient was doing. Daughter asked about possible d/c plans. RN did inform family, patient was admitted today, no set date for d/c has been discussed at this time. She voiced thankfulness for call and update. I will continue to monitor  patient as the shift progresses

## 2018-10-11 NOTE — ED Notes (Signed)
Pt ambulated around his bed 3x, SO at 97%

## 2018-10-11 NOTE — ED Notes (Signed)
Admitting provider at bedside.

## 2018-10-11 NOTE — ED Notes (Signed)
[facility] called, will be here after shift change most likely

## 2018-10-11 NOTE — ED Notes (Addendum)
Called [facility] for update on pt transport to [facility] spoke with [place] from [facility]

## 2018-10-11 NOTE — Progress Notes (Signed)
[facility] PLAN OF CARE NOTE Patient: Austin Page AOZ:[id number]   PCP: Patient, [name] DOB: [birth year]   DOA: 25-Oct-2018   DOS: 10/11/2018    Patient was admitted by my colleague Dr. Antionette Char earlier on 10/11/2018. I have reviewed the H&P as well as assessment and plan and agree with the same. Important changes in the plan are listed below.  Plan of care: Principal Problem:   Acute respiratory disease due to COVID-19 virus Active Problems:   Hyperglycemia   Hyponatremia   COVID 19 SARS Cov2 positive Tachycardic and tachypneic but not hypoxic.  Continue to observe for now  Newly diagnosed diabetes melitus, likely type 2 A1C 10.8  Pt not aware of any diabetes history  Continue sliding scale for today  Author: Lynden Oxford, MD [facility] 10/11/2018 11:28 AM   If 7PM-7AM, please contact night-coverage at [email or web address]

## 2018-10-11 NOTE — ED Notes (Signed)
Called [facility] for an update on pt transport to [facility]. Spoke with Ruby from [facility].

## 2018-10-11 NOTE — Progress Notes (Signed)
Blood sugar 401 MD notified via Amion

## 2018-10-11 NOTE — ED Notes (Signed)
ED EKG done

## 2018-10-11 NOTE — Progress Notes (Signed)
Patient heart rate ranging bet 120's -135, he denies chest pain or cardiac discomfort. Vitals are as follows BP 140/80 , HR 80 T 101.0  Dr Joseph Art  paged  Via with above finding. New orders were initiated

## 2018-10-11 NOTE — Progress Notes (Signed)
[facility] TEAM 1 - Stepdown/ICU TEAM  Austin Page  RWE:[id number] DOB: 1951-02-18 DOA: Nov 09, 2018 PCP: Patient, [name]    Brief Narrative:  67yo w/ no signif med hx who presented w/ 3 days of fevers, chills, cough, nausea, nonbloody vomiting, and generalized weakness. Patient had contact with a family member with confirmed COVID-19.   In the ED he was found to be febrile to 39.5 C, saturating mid 90s on room air, tachypneic in the mid 30s, tachycardic in the 130s, and with stable blood pressure. CXR was negative for acute cardiopulmonary disease but CTa chest demonstrated multiple peripheral groundglass opacities concerning for viral pneumonia but no PE.  Chemistry panel noted a glucose of 311 and sodium 128. COVID-19 testing was positive.   Significant Events: 5/23 admit via [place] - transferred to [facility]  COVID-19 specific Treatment: None  Subjective: Extensive chart review completed. Pt was evaluated by the admitting doctor earlier today.   Assessment & Plan:  Acute hypoxic respiratory failure - COVID-19 PNA  Recent Labs    10/11/18 0548  DDIMER 0.46  LDH 226*  CRP 10.8*    Sinus tachycardia   Newly diagnosed DM A1c 10.7  Hyponatremia   DVT prophylaxis: lovenox  Code Status: FULL CODE Family Communication: no family present at time of exam  Disposition Plan:   Consultants:  none  Antimicrobials:  none  Objective: Blood pressure (!) 156/91, pulse (!) 131, temperature (!) 102.3 F (39.1 C), temperature source Oral, resp. rate (!) 33, height 5\' 7"  (1.702 m), weight 72.6 kg, SpO2 91 %.  Intake/Output Summary (Last 24 hours) at 10/11/2018 1635 Last data filed at 10/11/2018 1550 Gross per 24 hour  Intake 800 ml  Output -  Net 800 ml   Filed Weights   11-09-2018 2035  Weight: 72.6 kg    Examination: Examined by admitting doctor earlier today   CBC: Recent Labs  Lab 11-09-2018 2039  WBC 11.9*  NEUTROABS 9.6*  HGB 13.9  HCT 41.2  MCV 88.8  PLT  183   Basic Metabolic Panel: Recent Labs  Lab 11-09-2018 2039  NA 128*  K 3.5  CL 94*  CO2 22  GLUCOSE 311*  BUN 10  CREATININE 0.79  CALCIUM 8.8*   GFR: Estimated Creatinine Clearance: 83.8 mL/min (by C-G formula based on SCr of 0.79 mg/dL).  Liver Function Tests: Recent Labs  Lab November 09, 2018 2039  AST 22  ALT 19  ALKPHOS 62  BILITOT 0.9  PROT 7.3  ALBUMIN 3.8    HbA1C: Hgb A1c MFr Bld  Date/Time Value Ref Range Status  10/11/2018 05:29 AM 10.7 (H) 4.8 - 5.6 % Final    Comment:    (NOTE) Pre diabetes:          5.7%-6.4% Diabetes:              >6.4% Glycemic control for   <7.0% adults with diabetes     CBG: Recent Labs  Lab 10/11/18 0921  GLUCAP 216*    Recent Results (from the past 240 hour(s))  SARS Coronavirus 2 (CEPHEID- Performed in [facility] hospital lab), Hosp Order     Status: Abnormal   Collection Time: 09-Nov-2018  8:44 PM  Result Value Ref Range Status   SARS Coronavirus 2 POSITIVE (A) NEGATIVE Final    Comment: RESULT CALLED TO, READ BACK BY AND VERIFIED WITH: L CHILTON RN 11-09-2018 2232 JDW (NOTE) If result is NEGATIVE SARS-CoV-2 target nucleic acids are NOT DETECTED. The SARS-CoV-2 RNA  is generally detectable in upper and lower  respiratory specimens during the acute phase of infection. The lowest  concentration of SARS-CoV-2 viral copies this assay can detect is 250  copies / mL. A negative result does not preclude SARS-CoV-2 infection  and should not be used as the sole basis for treatment or other  patient management decisions.  A negative result may occur with  improper specimen collection / handling, submission of specimen other  than nasopharyngeal swab, presence of viral mutation(s) within the  areas targeted by this assay, and inadequate number of viral copies  (<250 copies / mL). A negative result must be combined with clinical  observations, patient history, and epidemiological information. If result is POSITIVE SARS-CoV-2 target  nucleic acids are DETECTED. The SAR S-CoV-2 RNA is generally detectable in upper and lower  respiratory specimens during the acute phase of infection.  Positive  results are indicative of active infection with SARS-CoV-2.  Clinical  correlation with patient history and other diagnostic information is  necessary to determine patient infection status.  Positive results do  not rule out bacterial infection or co-infection with other viruses. If result is PRESUMPTIVE POSTIVE SARS-CoV-2 nucleic acids MAY BE PRESENT.   A presumptive positive result was obtained on the submitted specimen  and confirmed on repeat testing.  While 2019 novel coronavirus  (SARS-CoV-2) nucleic acids may be present in the submitted sample  additional confirmatory testing may be necessary for epidemiological  and / or clinical management purposes  to differentiate between  SARS-CoV-2 and other Sarbecovirus currently known to infect humans.  If clinically indicated additional testing with an alternate test  methodology [phone]([id number]) is advis ed. The SARS-CoV-2 RNA is generally  detectable in upper and lower respiratory specimens during the acute  phase of infection. The expected result is Negative. Fact Sheet for Patients:  BoilerBrush.com.cyhttps://www.fda.gov/media/136312/download Fact Sheet for Healthcare Providers: https://pope.com/https://www.fda.gov/media/136313/download This test is not yet approved or cleared by the [place] FDA and has been authorized for detection and/or diagnosis of SARS-CoV-2 by FDA under an Emergency Use Authorization (EUA).  This EUA will remain in effect (meaning this test can be used) for the duration of the COVID-19 declaration under Section 564(b)(1) of the Act, 21 U.S.C. section 360bbb-3(b)(1), unless the authorization is terminated or revoked sooner. Performed at [facility], [address] [place], [place] [address]      Scheduled Meds: . enoxaparin (LOVENOX) injection  40 mg Subcutaneous Q24H  .  insulin aspart  0-5 Units Subcutaneous QHS  . insulin aspart  0-9 Units Subcutaneous TID WC  . sodium chloride flush  3 mL Intravenous Once  . sodium chloride flush  3 mL Intravenous Q12H  . sodium chloride flush  3 mL Intravenous Q12H   Continuous Infusions: . sodium chloride       LOS: 0 days   Lonia BloodJeffrey T. McClung, MD [facility] Office  [phone] Pager - Text Page per Loretha StaplerAmion  If 7PM-7AM, please contact night-coverage per Amion 10/11/2018, 4:35 PM

## 2018-10-11 NOTE — Progress Notes (Signed)
2 sets of CBG orders noted. spoke with Dr Joseph Art about this. Instructed to d/c bedtime sliding scale orders

## 2018-10-11 NOTE — ED Notes (Signed)
ED TO INPATIENT HANDOFF REPORT  ED Nurse Name and Phone #:  [phone]  S Name/Age/Gender Austin Page 68 y.o. male Room/Bed: 029C/029C  Code Status   Code Status: Not on file  Home/SNF/Other Home Patient oriented to: self, place, time and situation Is this baseline? Yes   Triage Complete: Triage complete  Chief Complaint COVID SX  Triage Note Pt in POV with other family members, reports COVID like S/S X2 days. Cough/fever/chills. States they have been around a family member that passed away from COVID this AM. Tachycardic and febrile in triage.    Allergies No Known Allergies  Level of Care/Admitting Diagnosis ED Disposition    ED Disposition Condition Comment   Admit  Hospital Area: [facility] [[id number]]  Level of Care: Progressive [102]  Covid Evaluation: Confirmed COVID Positive  Isolation Risk Level: Low Risk/Droplet (Less than 4L Aragon supplementation)  Diagnosis: Acute respiratory disease due to COVID-19 virus [[id number]]  Admitting Physician: Briscoe Deutscher [[id number]]  Attending Physician: Briscoe Deutscher [[id number]]  Estimated length of stay: past midnight tomorrow  Certification:: I certify this patient will need inpatient services for at least 2 midnights  PT Class (Do Not Modify): Inpatient [101]  PT Acc Code (Do Not Modify): Private [1]       B Medical/Surgery History History reviewed. No pertinent past medical history. History reviewed. No pertinent surgical history.   A IV Location/Drains/Wounds Patient Lines/Drains/Airways Status   Active Line/Drains/Airways    Name:   Placement date:   Placement time:   Site:   Days:   Peripheral IV 10/28/2018 Right Antecubital   10/28/2018    2236    Antecubital   1          Intake/Output Last 24 hours No intake or output data in the 24 hours ending 10/11/18 0501  Labs/Imaging Results for orders placed or performed during the hospital encounter of 10-28-18 (from the past 48 hour(s))   Comprehensive metabolic panel     Status: Abnormal   Collection Time: October 28, 2018  8:39 PM  Result Value Ref Range   Sodium 128 (L) 135 - 145 mmol/L   Potassium 3.5 3.5 - 5.1 mmol/L   Chloride 94 (L) 98 - 111 mmol/L   CO2 22 22 - 32 mmol/L   Glucose, Bld 311 (H) 70 - 99 mg/dL   BUN 10 8 - 23 mg/dL   Creatinine, Ser 0.10 0.61 - 1.24 mg/dL   Calcium 8.8 (L) 8.9 - 10.3 mg/dL   Total Protein 7.3 6.5 - 8.1 g/dL   Albumin 3.8 3.5 - 5.0 g/dL   AST 22 15 - 41 U/L   ALT 19 0 - 44 U/L   Alkaline Phosphatase 62 38 - 126 U/L   Total Bilirubin 0.9 0.3 - 1.2 mg/dL   GFR calc non Af Amer >60 >60 mL/min   GFR calc Af Amer >60 >60 mL/min   Anion gap 12 5 - 15    Comment: Performed at [facility], [address], [place], [place] [address]  CBC with Differential     Status: Abnormal   Collection Time: 10-28-2018  8:39 PM  Result Value Ref Range   WBC 11.9 (H) 4.0 - 10.5 K/uL   RBC 4.64 4.22 - 5.81 MIL/uL   Hemoglobin 13.9 13.0 - 17.0 g/dL   HCT 66.4 40.3 - 47.4 %   MCV 88.8 80.0 - 100.0 fL   MCH 30.0 26.0 - 34.0 pg   MCHC 33.7  30.0 - 36.0 g/dL   RDW 16.113.0 09.611.5 - 04.515.5 %   Platelets 183 150 - 400 K/uL   nRBC 0.0 0.0 - 0.2 %   Neutrophils Relative % 81 %   Neutro Abs 9.6 (H) 1.7 - 7.7 K/uL   Lymphocytes Relative 10 %   Lymphs Abs 1.2 0.7 - 4.0 K/uL   Monocytes Relative 8 %   Monocytes Absolute 1.0 0.1 - 1.0 K/uL   Eosinophils Relative 1 %   Eosinophils Absolute 0.1 0.0 - 0.5 K/uL   Basophils Relative 0 %   Basophils Absolute 0.0 0.0 - 0.1 K/uL   Immature Granulocytes 0 %   Abs Immature Granulocytes 0.03 0.00 - 0.07 K/uL    Comment: Performed at [facility], [address] [place], [place] [address]  SARS Coronavirus 2 (CEPHEID- Performed in [facility] hospital lab), Hosp Order     Status: Abnormal   Collection Time: 10/16/2018  8:44 PM  Result Value Ref Range   SARS Coronavirus 2 POSITIVE (A) NEGATIVE    Comment: RESULT CALLED TO, READ BACK BY AND VERIFIED WITH: L CHILTON RN  10/19/2018 2232 JDW (NOTE) If result is NEGATIVE SARS-CoV-2 target nucleic acids are NOT DETECTED. The SARS-CoV-2 RNA is generally detectable in upper and lower  respiratory specimens during the acute phase of infection. The lowest  concentration of SARS-CoV-2 viral copies this assay can detect is 250  copies / mL. A negative result does not preclude SARS-CoV-2 infection  and should not be used as the sole basis for treatment or other  patient management decisions.  A negative result may occur with  improper specimen collection / handling, submission of specimen other  than nasopharyngeal swab, presence of viral mutation(s) within the  areas targeted by this assay, and inadequate number of viral copies  (<250 copies / mL). A negative result must be combined with clinical  observations, patient history, and epidemiological information. If result is POSITIVE SARS-CoV-2 target nucleic acids are DETECTED. The SAR S-CoV-2 RNA is generally detectable in upper and lower  respiratory specimens during the acute phase of infection.  Positive  results are indicative of active infection with SARS-CoV-2.  Clinical  correlation with patient history and other diagnostic information is  necessary to determine patient infection status.  Positive results do  not rule out bacterial infection or co-infection with other viruses. If result is PRESUMPTIVE POSTIVE SARS-CoV-2 nucleic acids MAY BE PRESENT.   A presumptive positive result was obtained on the submitted specimen  and confirmed on repeat testing.  While 2019 novel coronavirus  (SARS-CoV-2) nucleic acids may be present in the submitted sample  additional confirmatory testing may be necessary for epidemiological  and / or clinical management purposes  to differentiate between  SARS-CoV-2 and other Sarbecovirus currently known to infect humans.  If clinically indicated additional testing with an alternate test  methodology [phone]([id number]) is advis ed. The  SARS-CoV-2 RNA is generally  detectable in upper and lower respiratory specimens during the acute  phase of infection. The expected result is Negative. Fact Sheet for Patients:  BoilerBrush.com.cyhttps://www.fda.gov/media/136312/download Fact Sheet for Healthcare Providers: https://pope.com/https://www.fda.gov/media/136313/download This test is not yet approved or cleared by the [place] FDA and has been authorized for detection and/or diagnosis of SARS-CoV-2 by FDA under an Emergency Use Authorization (EUA).  This EUA will remain in effect (meaning this test can be used) for the duration of the COVID-19 declaration under Section 564(b)(1) of the Act, 21 U.S.C. section 360bbb-3(b)(1), unless the authorization  is terminated or revoked sooner. Performed at [facility], [address], [place], [place] [address]   Lactic acid, plasma     Status: None   Collection Time: Oct 24, 2018  8:57 PM  Result Value Ref Range   Lactic Acid, Venous 1.2 0.5 - 1.9 mmol/L    Comment: Performed at [facility], [address] [place], [place] [address]   Ct Angio Chest Pe W And/or Wo Contrast  Result Date: 10/11/2018 CLINICAL DATA:  68 y/o  M; cough, fever, chills.  COVID-19 positive. EXAM: CT ANGIOGRAPHY CHEST WITH CONTRAST TECHNIQUE: Multidetector CT imaging of the chest was performed using the standard protocol during bolus administration of intravenous contrast. Multiplanar CT image reconstructions and MIPs were obtained to evaluate the vascular anatomy. CONTRAST:  OMNIPAQUE IOHEXOL 300 MG/ML  SOLN COMPARISON:  2018-10-24 chest radiograph FINDINGS: Cardiovascular: Mild respiratory motion artifact. Satisfactory opacification of the pulmonary arteries to the segmental level. No evidence of pulmonary embolism. Normal heart size. No pericardial effusion. Mild aortic and moderate coronary artery calcific atherosclerosis. Mediastinum/Nodes: No enlarged mediastinal, hilar, or axillary lymph nodes. Thyroid gland, trachea, and  esophagus demonstrate no significant findings. Lungs/Pleura: Multiple peripheral ground-glass opacities with the largest confluent opacity in the right upper lobe. The right upper lobe demonstrates intra lobular septal thickening "crazy paving" pattern. No pleural effusion or pneumothorax. Upper Abdomen: No acute abnormality. Musculoskeletal: No chest wall abnormality. No acute or significant osseous findings. Review of the MIP images confirms the above findings. IMPRESSION: 1. Mild respiratory motion artifact. No pulmonary embolus identified. 2. There are a spectrum of findings in the lungs which can be seen with acute atypical infection (as well as other non-infectious etiologies). In particular, viral pneumonia (including COVID-19) should be considered in the appropriate clinical setting. Critical Value/emergent results were called by telephone at the time of interpretation on 10/11/2018 at 3:08 am to [place] MIA [facility] , who verbally acknowledged these results. Electronically Signed   By: Mitzi Hansen M.D.   On: 10/11/2018 03:21   Dg Chest Portable 1 View  Result Date: 10-24-18 CLINICAL DATA:  Symptoms of possible COVID-19 EXAM: PORTABLE CHEST 1 VIEW COMPARISON:  None. FINDINGS: The heart size and mediastinal contours are within normal limits. Both lungs are clear. The visualized skeletal structures are unremarkable. IMPRESSION: No active disease. Electronically Signed   By: Deatra Robinson M.D.   On: Oct 24, 2018 22:55    Pending Labs Unresulted Labs (From admission, onward)   None      Vitals/Pain Today's Vitals   10/11/18 0115 10/11/18 0126 10/11/18 0130 10/11/18 0145  BP: (!) 156/96  (!) 159/100 (!) 150/93  Pulse: (!) 117  (!) 124 (!) 113  Resp:   (!) 32 (!) 31  Temp:  99.7 F (37.6 C)    TempSrc:  Rectal    SpO2: 96%  96% 96%  Weight:      Height:      PainSc:        Isolation Precautions Droplet and Contact precautions  Medications Medications  sodium chloride flush  (NS) 0.9 % injection 3 mL (3 mLs Intravenous Not Given October 24, 2018 2145)  acetaminophen (TYLENOL) tablet 1,000 mg (1,000 mg Oral Given Oct 24, 2018 2045)  sodium chloride 0.9 % bolus 1,000 mL (0 mLs Intravenous Stopped 10/11/18 0153)  iohexol (OMNIPAQUE) 300 MG/ML solution 100 mL (100 mLs Intravenous Contrast Given 10/11/18 0257)    Mobility walks Low fall risk   Focused Assessments Cardiac Assessment Handoff:    No results  found for: CKTOTAL, CKMB, CKMBINDEX, TROPONINI No results found for: DDIMER Does the Patient currently have chest pain? Yes   , Pulmonary Assessment Handoff:  Lung sounds: Bilateral Breath Sounds: Clear, Diminished L Breath Sounds: Clear, Diminished R Breath Sounds: Clear, Diminished O2 Device: Room Air        R Recommendations: See Admitting Provider Note  Report given to:   Additional Notes:  Speak some english

## 2018-10-12 ENCOUNTER — Inpatient Hospital Stay (HOSPITAL_COMMUNITY): Payer: BC Managed Care – PPO

## 2018-10-12 LAB — CBC WITH DIFFERENTIAL/PLATELET
Abs Immature Granulocytes: 0.05 10*3/uL (ref 0.00–0.07)
Basophils Absolute: 0 10*3/uL (ref 0.0–0.1)
Basophils Relative: 0 %
Eosinophils Absolute: 0 10*3/uL (ref 0.0–0.5)
Eosinophils Relative: 0 %
HCT: 38 % — ABNORMAL LOW (ref 39.0–52.0)
Hemoglobin: 12.3 g/dL — ABNORMAL LOW (ref 13.0–17.0)
Immature Granulocytes: 0 %
Lymphocytes Relative: 14 %
Lymphs Abs: 1.7 10*3/uL (ref 0.7–4.0)
MCH: 29.7 pg (ref 26.0–34.0)
MCHC: 32.4 g/dL (ref 30.0–36.0)
MCV: 91.8 fL (ref 80.0–100.0)
Monocytes Absolute: 0.5 10*3/uL (ref 0.1–1.0)
Monocytes Relative: 4 %
Neutro Abs: 10 10*3/uL — ABNORMAL HIGH (ref 1.7–7.7)
Neutrophils Relative %: 82 %
Platelets: 198 10*3/uL (ref 150–400)
RBC: 4.14 MIL/uL — ABNORMAL LOW (ref 4.22–5.81)
RDW: 13.2 % (ref 11.5–15.5)
WBC: 12.3 10*3/uL — ABNORMAL HIGH (ref 4.0–10.5)
nRBC: 0 % (ref 0.0–0.2)

## 2018-10-12 LAB — CK: Total CK: 330 U/L (ref 49–397)

## 2018-10-12 LAB — GLUCOSE, CAPILLARY
Glucose-Capillary: 214 mg/dL — ABNORMAL HIGH (ref 70–99)
Glucose-Capillary: 308 mg/dL — ABNORMAL HIGH (ref 70–99)
Glucose-Capillary: 213 mg/dL — ABNORMAL HIGH (ref 70–99)

## 2018-10-12 LAB — D-DIMER, QUANTITATIVE: D-Dimer, Quant: 0.49 ug/mL-FEU (ref 0.00–0.50)

## 2018-10-12 LAB — COMPREHENSIVE METABOLIC PANEL
ALT: 18 U/L (ref 0–44)
AST: 29 U/L (ref 15–41)
Albumin: 3 g/dL — ABNORMAL LOW (ref 3.5–5.0)
Alkaline Phosphatase: 49 U/L (ref 38–126)
Anion gap: 9 (ref 5–15)
BUN: 19 mg/dL (ref 8–23)
CO2: 22 mmol/L (ref 22–32)
Calcium: 8 mg/dL — ABNORMAL LOW (ref 8.9–10.3)
Chloride: 101 mmol/L (ref 98–111)
Creatinine, Ser: 0.75 mg/dL (ref 0.61–1.24)
GFR calc Af Amer: >60 mL/min (ref >60)
GFR calc non Af Amer: >60 mL/min (ref >60)
Glucose, Bld: 209 mg/dL — ABNORMAL HIGH (ref 70–99)
Potassium: 3.5 mmol/L (ref 3.5–5.1)
Sodium: 132 mmol/L — ABNORMAL LOW (ref 135–145)
Total Bilirubin: 0.7 mg/dL (ref 0.3–1.2)
Total Protein: 6.4 g/dL — ABNORMAL LOW (ref 6.5–8.1)

## 2018-10-12 LAB — FERRITIN: Ferritin: 160 ng/mL (ref 24–336)

## 2018-10-12 LAB — C-REACTIVE PROTEIN: CRP: 19.5 mg/dL — ABNORMAL HIGH (ref <1.0)

## 2018-10-12 MED ORDER — SODIUM CHLORIDE 0.9 % IV SOLN
200.0000 mg | Freq: Once | INTRAVENOUS | Status: AC
  Administered 2018-10-12: 14:00:00 200 mg via INTRAVENOUS
  Filled 2018-10-12: qty 40

## 2018-10-12 MED ORDER — TOCILIZUMAB 400 MG/20ML IV SOLN
580.0000 mg | Freq: Once | INTRAVENOUS | Status: AC
  Administered 2018-10-12: 580 mg via INTRAVENOUS
  Filled 2018-10-12: qty 20

## 2018-10-12 MED ORDER — INSULIN GLARGINE 100 UNIT/ML ~~LOC~~ SOLN
10.0000 [IU] | Freq: Every day | SUBCUTANEOUS | Status: DC
  Administered 2018-10-12 – 2018-10-13 (×2): 10 [IU] via SUBCUTANEOUS
  Filled 2018-10-12 (×4): qty 0.1

## 2018-10-12 MED ORDER — SODIUM CHLORIDE 0.9 % IV SOLN
100.0000 mg | INTRAVENOUS | Status: AC
  Administered 2018-10-13 – 2018-10-16 (×4): 100 mg via INTRAVENOUS
  Filled 2018-10-12 (×4): qty 20

## 2018-10-12 NOTE — Care Management (Signed)
Case manager will continue to monitor for appropriate disposition as he medically improves. May he be blessed to do so.    Vance Peper, RN BSN Case Manager [phone]

## 2018-10-12 NOTE — Progress Notes (Signed)
Patient attempting to get out of bed/or sit up to use his urinal. He appears to be weakened  to lower extremities and poses a risk for falls. I have placed him back in bed and initiated bed alarm . I have also attempted use of condom cath and explained used/purpose via  interpreter line . Patient agreed to use of condom cath but later pulled off. I will continue to monitor patients safety as shift progresses

## 2018-10-12 NOTE — Progress Notes (Signed)
CardioVascular Research Department and AHF Team  ReDS Research Project   Patient #: [id number]  ReDS Measurement  Right: 51 %  Left: 44 %

## 2018-10-12 NOTE — Progress Notes (Signed)
Temp down  to 98.4

## 2018-10-12 NOTE — Progress Notes (Signed)
[place], MD paged regarding the pt's continued fever of 100.9 , BP of 177/80, SpO2 of 88% on 2L, increased to 3L, and the pt reported difficulty breathing. Pt was moaning and shivering. I will continue to monitor.

## 2018-10-12 NOTE — Progress Notes (Signed)
Patient's temp now@ 100.1

## 2018-10-12 NOTE — Progress Notes (Signed)
Patient currently has a temp of 101.3, Rn will medicate with ylenol

## 2018-10-12 NOTE — Progress Notes (Signed)
PROGRESS NOTE    Austin Page  ZOX:[id number] DOB: 1951/03/30 DOA: 25-Oct-2018 PCP: Patient, [name]      Brief Narrative:  Mr. Austin Page is a 68 y.o. M with no significant PMHx who presented with 3 days fever, chills, cough, NBNB emesis and weakness as well as known COVID contact.    In ER, febrile, tachycardic, and tachypneic.  CTA chest showed peripheral ground glass opacities, but no hypoxia.  COVID+.  Started on fluids and transferred to [facility].       Assessment & Plan:  Coronavirus pneumonitis with acute hypoxic respiratory failure In setting of ongoing 2020 COVID-19 pandemic.  CXR clear but CT chest shows atypical pneumonia, now requiring supplemental O2.  Still febrile.  CRP > 10, dimer normal.  LFT normal.  O2 requirements increasing and radiographic evidence of bilateral pneumonia, in a patient with diabetes, elevated CRP and age >36.  The patient has no history of liver disease or hepatitis.  No history of TB, no history of colon surgery or diverticulitis, and no active infection.  Discussed risks and benefits of Actemra and remdesivir, the patient understands and agrees to proceed with remdesivir under FDA EUA.  -Consult to pharmacy for remdesivir -VTE PPx with Lovenox, standard dose -Continue Zinc and Vitamin C -Daily d-dimer, ferritin and CRP -Stop fluids -Proning when able -IS and flutter    New onset diabetes Glucoses very high -Continue SSI corrections -Start Lantus  Hyponatremia Asymptomatic -Fluid restriction       MDM and disposition: The below labs and imaging reports were reviewed and summarized above.  Medication management as above.  Remdesivir EUA was reviewed, and treatment initiated after consultation with Pharmacy  The patient was admitted with COVID, now worsening O2 requirements.    This is a severe acute illness with high risk of death.  An extensive amount of data was considered.      DVT prophylaxis: Lovenox Code  Status: FULL Family Communication: Daughter    Consultants:   None  Procedures:   CTA chest -- no PE, but bilateral GGO  Antimicrobials:   Remdesivir 5/24 >>    Subjective: Tachypneic, tired.  No dyspnea.  No confusion.  Fever noted.  No vomiting, sputum, hemoptysis.  All history collected through telephonic Hindi interpreter.  Objective: Vitals:   10/11/18 1529 10/11/18 1946 10/12/18 0355 10/12/18 0532  BP:  (!) 148/68 (!) 149/83   Pulse:  (!) 127 (!) 105   Resp:  (!) 35 (!) 21   Temp: (!) 102.3 F (39.1 C) (!) 101.3 F (38.5 C) (!) 102.4 F (39.1 C) 100.1 F (37.8 C)  TempSrc: Oral Oral Oral Oral  SpO2:  92% 94%   Weight:      Height:        Intake/Output Summary (Last 24 hours) at 10/12/2018 8119 Last data filed at 10/12/2018 0209 Gross per 24 hour  Intake 1296.14 ml  Output -  Net 1296.14 ml   Filed Weights   October 25, 2018 2035  Weight: 72.6 kg    Examination: General appearance:  adult male, alert and in no acute distress.  Appears tired. HEENT: Anicteric, conjunctiva pink, lids and lashes normal. No nasal deformity, discharge, epistaxis.  Lips moist, dentition normal.  Oropharynx moist, no oral lesions.  Hearing normal. Skin: Warm and dry.  No jaundice.  No suspicious rashes or lesions. Cardiac: Tachycardic, regular, nl S1-S2, no murmurs appreciated.  Capillary refill is brisk.  JVP normal  No LE edema.  Radial pulses  2+ and symmetric. Respiratory: Tachypneic.  Crackles at bilateral bases.  No wheezing. Abdomen: Abdomen soft.  No TTP or guarding. No ascites, distension, hepatosplenomegaly.   MSK: No deformities or effusions. Neuro: Awake and alert.  EOMI, moves all extremities. Speech fluent.    Psych: Sensorium intact and responding to questions, attention normal. Affect normal.  Judgment and insight appear normal.    Data Reviewed: I have personally reviewed following labs and imaging studies:  CBC: Recent Labs  Lab 10/02/2018 2039  WBC 11.9*   NEUTROABS 9.6*  HGB 13.9  HCT 41.2  MCV 88.8  PLT 183   Basic Metabolic Panel: Recent Labs  Lab 10/16/2018 2039 10/11/18 2002  NA 128* 129*  K 3.5 4.0  CL 94* 98  CO2 22 22  GLUCOSE 311* 375*  BUN 10 18  CREATININE 0.79 0.83  CALCIUM 8.8* 8.5*  MG  --  1.8   GFR: Estimated Creatinine Clearance: 80.7 mL/min (by C-G formula based on SCr of 0.83 mg/dL). Liver Function Tests: Recent Labs  Lab 09/29/2018 2039 10/11/18 2002  AST 22 26  ALT 19 20  ALKPHOS 62 57  BILITOT 0.9 0.6  PROT 7.3 6.6  ALBUMIN 3.8 3.2*   No results for input(s): LIPASE, AMYLASE in the last 168 hours. No results for input(s): AMMONIA in the last 168 hours. Coagulation Profile: No results for input(s): INR, PROTIME in the last 168 hours. Cardiac Enzymes: No results for input(s): CKTOTAL, CKMB, CKMBINDEX, TROPONINI in the last 168 hours. BNP (last 3 results) No results for input(s): PROBNP in the last 8760 hours. HbA1C: Recent Labs    10/11/18 0529  HGBA1C 10.7*   CBG: Recent Labs  Lab 10/11/18 0921 10/11/18 1648 10/11/18 2340 10/12/18 0351  GLUCAP 216* 236* 285* 214*   Lipid Profile: Recent Labs    10/11/18 2002  CHOL 147  HDL 34*  LDLCALC 96  TRIG 86  CHOLHDL 4.3   Thyroid Function Tests: No results for input(s): TSH, T4TOTAL, FREET4, T3FREE, THYROIDAB in the last 72 hours. Anemia Panel: No results for input(s): VITAMINB12, FOLATE, FERRITIN, TIBC, IRON, RETICCTPCT in the last 72 hours. Urine analysis: No results found for: COLORURINE, APPEARANCEUR, LABSPEC, PHURINE, GLUCOSEU, HGBUR, BILIRUBINUR, KETONESUR, PROTEINUR, UROBILINOGEN, NITRITE, LEUKOCYTESUR Sepsis Labs: @LABRCNTIP (procalcitonin:4,lacticacidven:4)  ) Recent Results (from the past 240 hour(s))  SARS Coronavirus 2 (CEPHEID- Performed in [facility] lab), Hosp Order     Status: Abnormal   Collection Time: 10/11/2018  8:44 PM  Result Value Ref Range Status   SARS Coronavirus 2 POSITIVE (A) NEGATIVE Final     Comment: RESULT CALLED TO, READ BACK BY AND VERIFIED WITH: L CHILTON RN 10/01/2018 2232 JDW (NOTE) If result is NEGATIVE SARS-CoV-2 target nucleic acids are NOT DETECTED. The SARS-CoV-2 RNA is generally detectable in upper and lower  respiratory specimens during the acute phase of infection. The lowest  concentration of SARS-CoV-2 viral copies this assay can detect is 250  copies / mL. A negative result does not preclude SARS-CoV-2 infection  and should not be used as the sole basis for treatment or other  patient management decisions.  A negative result may occur with  improper specimen collection / handling, submission of specimen other  than nasopharyngeal swab, presence of viral mutation(s) within the  areas targeted by this assay, and inadequate number of viral copies  (<250 copies / mL). A negative result must be combined with clinical  observations, patient history, and epidemiological information. If result is POSITIVE SARS-CoV-2 target nucleic acids  are DETECTED. The SAR S-CoV-2 RNA is generally detectable in upper and lower  respiratory specimens during the acute phase of infection.  Positive  results are indicative of active infection with SARS-CoV-2.  Clinical  correlation with patient history and other diagnostic information is  necessary to determine patient infection status.  Positive results do  not rule out bacterial infection or co-infection with other viruses. If result is PRESUMPTIVE POSTIVE SARS-CoV-2 nucleic acids MAY BE PRESENT.   A presumptive positive result was obtained on the submitted specimen  and confirmed on repeat testing.  While 2019 novel coronavirus  (SARS-CoV-2) nucleic acids may be present in the submitted sample  additional confirmatory testing may be necessary for epidemiological  and / or clinical management purposes  to differentiate between  SARS-CoV-2 and other Sarbecovirus currently known to infect humans.  If clinically indicated additional  testing with an alternate test  methodology [phone]) is advis ed. The SARS-CoV-2 RNA is generally  detectable in upper and lower respiratory specimens during the acute  phase of infection. The expected result is Negative. Fact Sheet for Patients:  BoilerBrush.com.cy Fact Sheet for Healthcare Providers: https://pope.com/ This test is not yet approved or cleared by the [place] FDA and has been authorized for detection and/or diagnosis of SARS-CoV-2 by FDA under an Emergency Use Authorization (EUA).  This EUA will remain in effect (meaning this test can be used) for the duration of the COVID-19 declaration under Section 564(b)(1) of the Act, 21 U.S.C. section 360bbb-3(b)(1), unless the authorization is terminated or revoked sooner. Performed at [facility], [address] [place], [place] [address]          Radiology Studies: Ct Angio Chest Pe W And/or Wo Contrast  Result Date: 10/11/2018 CLINICAL DATA:  68 y/o  M; cough, fever, chills.  COVID-19 positive. EXAM: CT ANGIOGRAPHY CHEST WITH CONTRAST TECHNIQUE: Multidetector CT imaging of the chest was performed using the standard protocol during bolus administration of intravenous contrast. Multiplanar CT image reconstructions and MIPs were obtained to evaluate the vascular anatomy. CONTRAST:  OMNIPAQUE IOHEXOL 300 MG/ML  SOLN COMPARISON:  2018-11-04 chest radiograph FINDINGS: Cardiovascular: Mild respiratory motion artifact. Satisfactory opacification of the pulmonary arteries to the segmental level. No evidence of pulmonary embolism. Normal heart size. No pericardial effusion. Mild aortic and moderate coronary artery calcific atherosclerosis. Mediastinum/Nodes: No enlarged mediastinal, hilar, or axillary lymph nodes. Thyroid gland, trachea, and esophagus demonstrate no significant findings. Lungs/Pleura: Multiple peripheral ground-glass opacities with the largest confluent  opacity in the right upper lobe. The right upper lobe demonstrates intra lobular septal thickening "crazy paving" pattern. No pleural effusion or pneumothorax. Upper Abdomen: No acute abnormality. Musculoskeletal: No chest wall abnormality. No acute or significant osseous findings. Review of the MIP images confirms the above findings. IMPRESSION: 1. Mild respiratory motion artifact. No pulmonary embolus identified. 2. There are a spectrum of findings in the lungs which can be seen with acute atypical infection (as well as other non-infectious etiologies). In particular, viral pneumonia (including COVID-19) should be considered in the appropriate clinical setting. Critical Value/emergent results were called by telephone at the time of interpretation on 10/11/2018 at 3:08 am to PA MIA [facility] , who verbally acknowledged these results. Electronically Signed   By: Mitzi Hansen M.D.   On: 10/11/2018 03:21   Dg Chest Port 1 View  Result Date: 10/12/2018 CLINICAL DATA:  Weakness and fever, history of COVID-19 EXAM: PORTABLE CHEST 1 VIEW COMPARISON:  10/11/2018 FINDINGS: Cardiac shadow is stable. Patchy  infiltrates are seen bilaterally particularly in the right upper lobe consistent with that seen on recent CT examination and consistent with the patient's given clinical history. No sizable effusion is seen. No bony abnormality is noted. Elevation the right hemidiaphragm is again noted. IMPRESSION: Patchy infiltrates predominately within the right upper lobe consistent with the given clinical history of COVID-19 positivity. Electronically Signed   By: Alcide Clever M.D.   On: 10/12/2018 03:59   Dg Chest Portable 1 View  Result Date: 27-Oct-2018 CLINICAL DATA:  Symptoms of possible COVID-19 EXAM: PORTABLE CHEST 1 VIEW COMPARISON:  None. FINDINGS: The heart size and mediastinal contours are within normal limits. Both lungs are clear. The visualized skeletal structures are unremarkable. IMPRESSION: No active  disease. Electronically Signed   By: Deatra Robinson M.D.   On: 10/27/18 22:55        Scheduled Meds: . enoxaparin (LOVENOX) injection  40 mg Subcutaneous Q24H  . insulin aspart  0-15 Units Subcutaneous Q4H  . metoprolol tartrate  2.5 mg Intravenous Q6H  . pneumococcal 23 valent vaccine  0.5 mL Intramuscular Tomorrow-1000  . sodium chloride flush  3 mL Intravenous Q12H   Continuous Infusions: . sodium chloride    . sodium chloride 100 mL/hr at 10/12/18 0719     LOS: 1 day    Time spent: 35 minutes  During this encounter: Patient Isolation: Airborne, contact, droplet HCP PPE: Face shield, head covering, N95, gown, gloves, and shoe covers    Alberteen Sam, MD [facility] 10/12/2018, 8:03 AM     Please page through AMION:  [email or web address] Password [email or web address] If 7PM-7AM, please contact night-coverage

## 2018-10-12 NOTE — Progress Notes (Signed)
Rapid Response called for pt. Pt tachycardic 110 SATS 93. RR 35-40 Bp 160/93. 6l [place]. Pt being proned. MD [place] at the bedside.

## 2018-10-12 NOTE — Progress Notes (Signed)
Patient has a temp of 102.4 .Po tylenol given. I will continue to monitor patient as shift progresses

## 2018-10-12 NOTE — Progress Notes (Signed)
Regarding off-label use of Actemra: This patient has confirmed COVID-19 in the setting of the ongoing 2020 coronavirus pandemic.  He has hypoxia and is high-risk for intubation (due to age, CRP > 14 mg/dL), but expected to survive >48 hours and has good baseline functional status.  He is not known to be on immunomodulators, anti-rejection medications, or cancer chemotherapy, has no history of TB or latent TB, and no history of diverticulitis or intestinal perforation.  Platelets are >50K, ANC is >500, and ALT/AST are below 5x ULN with no known hepatitis B infection. The investigational nature of this medication was discussed with the patient and daughter and the latter understands and chooses to proceed as the potential benefits are felt to outweigh risks at this time.  -Tocilizumab 8 mg/kg now -Monitor for infusion reaction -Daily LFTs

## 2018-10-13 DIAGNOSIS — J80 Acute respiratory distress syndrome: Secondary | ICD-10-CM

## 2018-10-13 DIAGNOSIS — J069 Acute upper respiratory infection, unspecified: Secondary | ICD-10-CM

## 2018-10-13 DIAGNOSIS — U071 COVID-19: Principal | ICD-10-CM

## 2018-10-13 LAB — CBC WITH DIFFERENTIAL/PLATELET
Abs Immature Granulocytes: 0.03 10*3/uL (ref 0.00–0.07)
Basophils Absolute: 0 10*3/uL (ref 0.0–0.1)
Basophils Relative: 0 %
Eosinophils Absolute: 0 10*3/uL (ref 0.0–0.5)
Eosinophils Relative: 0 %
HCT: 41.4 % (ref 39.0–52.0)
Hemoglobin: 13.7 g/dL (ref 13.0–17.0)
Immature Granulocytes: 0 %
Lymphocytes Relative: 25 %
Lymphs Abs: 2.9 10*3/uL (ref 0.7–4.0)
MCH: 29.8 pg (ref 26.0–34.0)
MCHC: 33.1 g/dL (ref 30.0–36.0)
MCV: 90.2 fL (ref 80.0–100.0)
Monocytes Absolute: 0.4 10*3/uL (ref 0.1–1.0)
Monocytes Relative: 4 %
Neutro Abs: 8.2 10*3/uL — ABNORMAL HIGH (ref 1.7–7.7)
Neutrophils Relative %: 71 %
Platelets: 222 10*3/uL (ref 150–400)
RBC: 4.59 MIL/uL (ref 4.22–5.81)
RDW: 13.2 % (ref 11.5–15.5)
WBC: 11.6 10*3/uL — ABNORMAL HIGH (ref 4.0–10.5)
nRBC: 0 % (ref 0.0–0.2)

## 2018-10-13 LAB — COMPREHENSIVE METABOLIC PANEL
ALT: 20 U/L (ref 0–44)
AST: 35 U/L (ref 15–41)
Albumin: 3 g/dL — ABNORMAL LOW (ref 3.5–5.0)
Alkaline Phosphatase: 60 U/L (ref 38–126)
Anion gap: 10 (ref 5–15)
BUN: 20 mg/dL (ref 8–23)
CO2: 23 mmol/L (ref 22–32)
Calcium: 8.7 mg/dL — ABNORMAL LOW (ref 8.9–10.3)
Chloride: 103 mmol/L (ref 98–111)
Creatinine, Ser: 0.65 mg/dL (ref 0.61–1.24)
GFR calc Af Amer: >60 mL/min (ref >60)
GFR calc non Af Amer: >60 mL/min (ref >60)
Glucose, Bld: 180 mg/dL — ABNORMAL HIGH (ref 70–99)
Potassium: 3.2 mmol/L — ABNORMAL LOW (ref 3.5–5.1)
Sodium: 136 mmol/L (ref 135–145)
Total Bilirubin: 0.8 mg/dL (ref 0.3–1.2)
Total Protein: 6.9 g/dL (ref 6.5–8.1)

## 2018-10-13 LAB — GLUCOSE, CAPILLARY
Glucose-Capillary: 163 mg/dL — ABNORMAL HIGH (ref 70–99)
Glucose-Capillary: 188 mg/dL — ABNORMAL HIGH (ref 70–99)
Glucose-Capillary: 184 mg/dL — ABNORMAL HIGH (ref 70–99)
Glucose-Capillary: 294 mg/dL — ABNORMAL HIGH (ref 70–99)

## 2018-10-13 LAB — FERRITIN: Ferritin: 271 ng/mL (ref 24–336)

## 2018-10-13 LAB — D-DIMER, QUANTITATIVE: D-Dimer, Quant: 0.9 ug/mL-FEU — ABNORMAL HIGH (ref 0.00–0.50)

## 2018-10-13 LAB — C-REACTIVE PROTEIN: CRP: 24.2 mg/dL — ABNORMAL HIGH (ref <1.0)

## 2018-10-13 LAB — INTERLEUKIN-6, PLASMA: Interleukin-6, Plasma: 124 pg/mL — ABNORMAL HIGH (ref 0.0–12.2)

## 2018-10-13 LAB — CK: Total CK: 256 U/L (ref 49–397)

## 2018-10-13 LAB — ABO/RH: ABO/RH(D): B POS

## 2018-10-13 MED ORDER — POTASSIUM CHLORIDE CRYS ER 20 MEQ PO TBCR
40.0000 meq | EXTENDED_RELEASE_TABLET | Freq: Once | ORAL | Status: AC
  Administered 2018-10-13: 40 meq via ORAL
  Filled 2018-10-13: qty 2

## 2018-10-13 MED ORDER — FUROSEMIDE 10 MG/ML IJ SOLN
60.0000 mg | Freq: Once | INTRAMUSCULAR | Status: AC
  Administered 2018-10-13: 60 mg via INTRAVENOUS
  Filled 2018-10-13: qty 6

## 2018-10-13 MED ORDER — METHYLPREDNISOLONE SODIUM SUCC 40 MG IJ SOLR
30.0000 mg | Freq: Every day | INTRAMUSCULAR | Status: DC
  Administered 2018-10-13 – 2018-10-24 (×12): 30 mg via INTRAVENOUS
  Filled 2018-10-13 (×11): qty 1

## 2018-10-13 MED ORDER — POTASSIUM CHLORIDE CRYS ER 20 MEQ PO TBCR
40.0000 meq | EXTENDED_RELEASE_TABLET | Freq: Once | ORAL | Status: AC
  Administered 2018-10-13: 18:00:00 40 meq via ORAL
  Filled 2018-10-13: qty 2

## 2018-10-13 MED ORDER — SODIUM CHLORIDE 0.9% IV SOLUTION: Freq: Once | INTRAVENOUS | Status: DC

## 2018-10-13 MED ORDER — ENOXAPARIN SODIUM 40 MG/0.4ML ~~LOC~~ SOLN
40.0000 mg | Freq: Two times a day (BID) | SUBCUTANEOUS | Status: DC
  Administered 2018-10-13 – 2018-11-07 (×51): 40 mg via SUBCUTANEOUS
  Filled 2018-10-13 (×53): qty 0.4

## 2018-10-13 MED ORDER — POTASSIUM CHLORIDE CRYS ER 20 MEQ PO TBCR
40.0000 meq | EXTENDED_RELEASE_TABLET | ORAL | Status: DC
  Administered 2018-10-13: 15:00:00 40 meq via ORAL

## 2018-10-13 MED ORDER — LIVING WELL WITH DIABETES BOOK
Freq: Once | Status: AC
  Administered 2018-10-14: 08:00:00
  Filled 2018-10-13: qty 1

## 2018-10-13 MED ORDER — TOCILIZUMAB 400 MG/20ML IV SOLN
8.0000 mg/kg | Freq: Once | INTRAVENOUS | Status: AC
  Administered 2018-10-13: 580 mg via INTRAVENOUS
  Filled 2018-10-13: qty 10

## 2018-10-13 NOTE — Progress Notes (Addendum)
I, Jeoffrey Massed, MD, consented Subject Austin Page (male, Date of Birth 01-Nov-1950, 68 y.o.) and with diagnosis of COVID-19, in the [facility] Expanded Access Program (EAP) Research Protocol for [employer] against COVID-19.  The consent took place under following circumstances.   Subject Capacity assessed by this investigator as:  Presence of adequate emotional and mental capacity to consent with normal ability to read and write.  Consent took place in the following setting(s):  In-room, face to face   The following were present for the consent process:  Investigator   A copy of the cover letter and signed consent document was provided to subject/LAR.  The original signed consent document has been placed in the subject's physical chart and will be scanned into the electronic medical record upon discharge.  Statement of acknowledgement that the following was discussed with the subject/LAR:    1) Discussed the purpose of the research and procedures  2) Discussed risks and benefits and uncertainties of study participation 3) Discussed subject's responsibilities  4) Discussed the measures in place to maintain subject's confidentiality while a participant on the trial  5) Discussed alternatives to study participation.   6) Discussed study participation is voluntary and that the subject's care would not be jeopardized if they declined participation in the study.   7) Discussed freedom to withdraw at any time.   8) All subject/LAR questions were answered to their satisfaction.   9) In case of emergency consent, investigator agreed to discuss with subject/LAR at earliest available opportunity when the subject stabilizes and/or LAR can be located.     Final Investigator [address] [place], [place] [phone]   Date: 10/13/2018 and 3:52 PM

## 2018-10-13 NOTE — Progress Notes (Signed)
Explained prone positioning through interpreter. Pt inconsistent with proning, often observed on side. Even when prone, sats remained 86-88% on 6L [place]. Pt also moved nasal cannula to chin while sleeping. Placed pt on NRB, sats now 92-94% at rest and still dropping to 80% with activity (changing bed with incont episodes). Continue to monitor. Zyriah Mask RN  

## 2018-10-13 NOTE — Progress Notes (Addendum)
PROGRESS NOTE    Austin Page  JXB:[id number] DOB: [birth year] DOA: 10-31-18 PCP: Patient, [name]      Brief Narrative:   Mr. Austin Page is a 68 y.o. M with no significant PMHx who presented with 3 days fever, chills, cough, NBNB emesis and weakness as well as known COVID contact.    In ER, febrile, tachycardic, and tachypneic.  CTA chest showed peripheral ground glass opacities, but no hypoxia.  COVID+.  Had rapid deterioration of respiratory status, requiring 15 L HF nasal cannula,  so he has been transferred to ICU 5/25 am.    Subjective: Patient had increased oxygen requirement overnight, where he had to be transferred to ICU .  Assessment & Plan:  Acute hypoxic respiratory failure due to COVID-19 infection coronavirus pneumonitis with acute hypoxic respiratory failure In setting of ongoing 2020 COVID-19 pandemic. -She was noted requirement at home, rapid progression of respiratory failure over the last 24 hours, went from 2 L nasal cannula to 15 L high flow nasal cannula, transferred to ICU this morning. -Is high risk for intubation, will try to avoid if possible, currently on 15 L nasal cannula. -He was encouraged to prone. -Received Actemra 5/24, will give another dose today -Continue with IV Solu-Medrol  -Continue to follow inflammatory markers markers, CRP and ferritin trending up despite receiving Actemra yesterday -1 dose of IV Lasix today -Remdesivir  started 10/12/2018 -Continue with zinc and vitamin C -IS and flutter -Procalcitonin within normal limit, no indication for antibiotics  COVID-19 Labs  Recent Labs    10/11/18 0548 10/12/18 0918 10/13/18 0500  DDIMER 0.46 0.49 0.90*  FERRITIN  --  160 271  LDH 226*  --   --   CRP 10.8* 19.5* 24.2*    Lab Results  Component Value Date   SARSCOV2NAA POSITIVE (A) Oct 31, 2018     New onset diabetes -Hemoglobin A1c at 10.7 -CBG significantly uncontrolled initially, improved with insulin sliding scale  and Lantus, now patient on steroids, will monitor closely  Hyponatremia -Resolved  Hypokalemia -Pleated today, recheck in a.m.    DVT prophylaxis: Lovenox Code Status: FULL Family Communication: Tried to call daughter at [phone], no response unable to leave a voicemail    Consultants:   PCCM  Procedures:   CTA chest -- no PE, but bilateral GGO  Antimicrobials:   Remdesivir 5/24 >>    Objective: Vitals:   10/13/18 0800 10/13/18 0900 10/13/18 1000 10/13/18 1100  BP: (!) 150/100 (!) 147/98 (!) 165/105 (!) 148/96  Pulse: 95 100 (!) 120 90  Resp: (!) 28 (!) 41 (!) 23 (!) 34  Temp:      TempSrc:      SpO2: 92% 93% (!) 82% 92%  Weight:      Height:        Intake/Output Summary (Last 24 hours) at 10/13/2018 1223 Last data filed at 10/13/2018 1200 Gross per 24 hour  Intake 989.54 ml  Output 1101 ml  Net -111.46 ml   Filed Weights   10-31-2018 2035  Weight: 72.6 kg    Examination:  Awake Alert, Oriented X 3, laying in prone position, tachypneic, use of accessory muscles Symmetrical Chest wall movement, Good air movement bilaterally, CTAB RRR,No Gallops,Rubs or new Murmurs, No Parasternal Heave +ve B.Sounds, Abd Soft, No tenderness, No rebound - guarding or rigidity. No Cyanosis, Clubbing or edema, No new Rash or bruise     Data Reviewed: I have personally reviewed following labs and imaging studies:  CBC:  Recent Labs  Lab Jun 16, 2018 2039 10/12/18 0918 10/13/18 0500  WBC 11.9* 12.3* 11.6*  NEUTROABS 9.6* 10.0* 8.2*  HGB 13.9 12.3* 13.7  HCT 41.2 38.0* 41.4  MCV 88.8 91.8 90.2  PLT 183 198 222   Basic Metabolic Panel: Recent Labs  Lab Jun 16, 2018 2039 10/11/18 2002 10/12/18 0918 10/13/18 0500  NA 128* 129* 132* 136  K 3.5 4.0 3.5 3.2*  CL 94* 98 101 103  CO2 22 22 22 23   GLUCOSE 311* 375* 209* 180*  BUN 10 18 19 20   CREATININE 0.79 0.83 0.75 0.65  CALCIUM 8.8* 8.5* 8.0* 8.7*  MG  --  1.8  --   --    GFR: Estimated Creatinine Clearance:  83.8 mL/min (by C-G formula based on SCr of 0.65 mg/dL). Liver Function Tests: Recent Labs  Lab Jun 16, 2018 2039 10/11/18 2002 10/12/18 0918 10/13/18 0500  AST 22 26 29  35  ALT 19 20 18 20   ALKPHOS 62 57 49 60  BILITOT 0.9 0.6 0.7 0.8  PROT 7.3 6.6 6.4* 6.9  ALBUMIN 3.8 3.2* 3.0* 3.0*   No results for input(s): LIPASE, AMYLASE in the last 168 hours. No results for input(s): AMMONIA in the last 168 hours. Coagulation Profile: No results for input(s): INR, PROTIME in the last 168 hours. Cardiac Enzymes: Recent Labs  Lab 10/12/18 0918 10/13/18 0500  CKTOTAL 330 256   BNP (last 3 results) No results for input(s): PROBNP in the last 8760 hours. HbA1C: Recent Labs    10/11/18 0529  HGBA1C 10.7*   CBG: Recent Labs  Lab 10/12/18 0351 10/12/18 1528 10/12/18 2103 10/13/18 0748 10/13/18 1208  GLUCAP 214* 308* 213* 163* 188*   Lipid Profile: Recent Labs    10/11/18 2002  CHOL 147  HDL 34*  LDLCALC 96  TRIG 86  CHOLHDL 4.3   Thyroid Function Tests: No results for input(s): TSH, T4TOTAL, FREET4, T3FREE, THYROIDAB in the last 72 hours. Anemia Panel: Recent Labs    10/12/18 0918 10/13/18 0500  FERRITIN 160 271   Urine analysis: No results found for: COLORURINE, APPEARANCEUR, LABSPEC, PHURINE, GLUCOSEU, HGBUR, BILIRUBINUR, KETONESUR, PROTEINUR, UROBILINOGEN, NITRITE, LEUKOCYTESUR Sepsis Labs: @LABRCNTIP (procalcitonin:4,lacticacidven:4)  ) Recent Results (from the past 240 hour(s))  SARS Coronavirus 2 (CEPHEID- Performed in [facility] lab), Hosp Order     Status: Abnormal   Collection Time: Jun 16, 2018  8:44 PM  Result Value Ref Range Status   SARS Coronavirus 2 POSITIVE (A) NEGATIVE Final    Comment: RESULT CALLED TO, READ BACK BY AND VERIFIED WITH: L CHILTON RN March 25, 2019 2232 JDW (NOTE) If result is NEGATIVE SARS-CoV-2 target nucleic acids are NOT DETECTED. The SARS-CoV-2 RNA is generally detectable in upper and lower  respiratory specimens during the  acute phase of infection. The lowest  concentration of SARS-CoV-2 viral copies this assay can detect is 250  copies / mL. A negative result does not preclude SARS-CoV-2 infection  and should not be used as the sole basis for treatment or other  patient management decisions.  A negative result may occur with  improper specimen collection / handling, submission of specimen other  than nasopharyngeal swab, presence of viral mutation(s) within the  areas targeted by this assay, and inadequate number of viral copies  (<250 copies / mL). A negative result must be combined with clinical  observations, patient history, and epidemiological information. If result is POSITIVE SARS-CoV-2 target nucleic acids are DETECTED. The SAR S-CoV-2 RNA is generally detectable in upper and lower  respiratory specimens during  the acute phase of infection.  Positive  results are indicative of active infection with SARS-CoV-2.  Clinical  correlation with patient history and other diagnostic information is  necessary to determine patient infection status.  Positive results do  not rule out bacterial infection or co-infection with other viruses. If result is PRESUMPTIVE POSTIVE SARS-CoV-2 nucleic acids MAY BE PRESENT.   A presumptive positive result was obtained on the submitted specimen  and confirmed on repeat testing.  While 2019 novel coronavirus  (SARS-CoV-2) nucleic acids may be present in the submitted sample  additional confirmatory testing may be necessary for epidemiological  and / or clinical management purposes  to differentiate between  SARS-CoV-2 and other Sarbecovirus currently known to infect humans.  If clinically indicated additional testing with an alternate test  methodology [phone]) is advis ed. The SARS-CoV-2 RNA is generally  detectable in upper and lower respiratory specimens during the acute  phase of infection. The expected result is Negative. Fact Sheet for Patients:   BoilerBrush.com.cy Fact Sheet for Healthcare Providers: https://pope.com/ This test is not yet approved or cleared by the [place] FDA and has been authorized for detection and/or diagnosis of SARS-CoV-2 by FDA under an Emergency Use Authorization (EUA).  This EUA will remain in effect (meaning this test can be used) for the duration of the COVID-19 declaration under Section 564(b)(1) of the Act, 21 U.S.C. section 360bbb-3(b)(1), unless the authorization is terminated or revoked sooner. Performed at [facility], [address], [place], [place] [address]   Culture, blood (Routine X 2) w Reflex to ID Panel     Status: None (Preliminary result)   Collection Time: 10/11/18  5:30 AM  Result Value Ref Range Status   Specimen Description BLOOD LEFT HAND  Final   Special Requests   Final    BOTTLES DRAWN AEROBIC AND ANAEROBIC Blood Culture adequate volume   Culture   Final    NO GROWTH 2 DAYS Performed at [facility], [address] [place], [place] [address]    Report Status PENDING  Incomplete  Culture, blood (Routine X 2) w Reflex to ID Panel     Status: None (Preliminary result)   Collection Time: 10/11/18  5:48 AM  Result Value Ref Range Status   Specimen Description BLOOD RIGHT HAND  Final   Special Requests   Final    BOTTLES DRAWN AEROBIC ONLY Blood Culture results may not be optimal due to an inadequate volume of blood received in culture bottles   Culture   Final    NO GROWTH 2 DAYS Performed at [facility], [address] [place], [place] [address]    Report Status PENDING  Incomplete         Radiology Studies: Dg Chest Port 1 View  Result Date: 10/12/2018 CLINICAL DATA:  Weakness and fever, history of COVID-19 EXAM: PORTABLE CHEST 1 VIEW COMPARISON:  10/11/2018 FINDINGS: Cardiac shadow is stable. Patchy infiltrates are seen bilaterally particularly in the right upper lobe consistent with that  seen on recent CT examination and consistent with the patient's given clinical history. No sizable effusion is seen. No bony abnormality is noted. Elevation the right hemidiaphragm is again noted. IMPRESSION: Patchy infiltrates predominately within the right upper lobe consistent with the given clinical history of COVID-19 positivity. Electronically Signed   By: Alcide Clever M.D.   On: 10/12/2018 03:59        Scheduled Meds: . enoxaparin (LOVENOX) injection  40 mg Subcutaneous  Q12H  . insulin aspart  0-15 Units Subcutaneous Q4H  . insulin glargine  10 Units Subcutaneous Daily  . methylPREDNISolone (SOLU-MEDROL) injection  30 mg Intravenous Daily  . sodium chloride flush  3 mL Intravenous Q12H   Continuous Infusions: . sodium chloride    . remdesivir 100 mg in NS 250 mL       LOS: 2 days    Huey Bienenstock, MD [facility] 10/13/2018, 12:23 PM     Please page through AMION:  [email or web address] Password [email or web address] If 7PM-7AM, please contact night-coverage

## 2018-10-13 NOTE — Progress Notes (Signed)
Called for rapid response due to patient satting in the 80s with increased work of breathing. On arrival patient began satting up to 95 with breathing 35 to 35 times per minute. BP stable. Patient with increased work of breathing and oxygen demands over last 24 hours. MD contacted and requested transfer to ICU, this RN, Carollee Herter RN, and Pieter Partridge RT transported to ICU.

## 2018-10-13 NOTE — Progress Notes (Signed)
Vent trigeminy rhythm noted on telemetry. Pt alert, BP WNL. AC notified RR RN to assess pt given changes overnight. RR RN stated to inform MD of arrhythmia, paged Dr. Jarvis Newcomer. Morning labs being drawn now. Continue to monitor. Mick Sell RN

## 2018-10-13 NOTE — Consult Note (Signed)
NAME:  Austin Page, MRN:  [id number], DOB:  [birth year], LOS: 2 ADMISSION DATE:  2018-10-28, CONSULTATION DATE:  10/12/2017 REFERRING MD:  Drs. Danford/Elgergawy, CHIEF COMPLAINT:  dyspnea  Brief History   68 year old male admitted on May 22 for hypoxemic respiratory failure secondary to COVID-19 pneumonia.  Transferred to the intensive care unit early a.m. May 25 for worsening hypoxemia.  History of present illness   This is a 68 year old male with no known prior past medical history though lab work suggests undiagnosed diabetes mellitus who was admitted to [facility] on 10-28-2018 in the setting of worsening shortness of breath.  He was diagnosed with COVID pneumonia.  He was treated with IV steroids, Actemra, and remdesivir but his hypoxemia worsened so he was moved to the ICU.  He has been lying in the prone position periodically since admission, he has some cough.  When asked if he feels bad he says no, when asked if he feels good he says yes.  Otherwise history taking is limited by language.  Past Medical History  Possible DM2  Significant Hospital Events   May 22 admission May 25 moved to ICU  Consults:  Pulmonary  Procedures:    Significant Diagnostic Tests:  Oct 13, 2018 CT angiogram chest showed no pulmonary embolism, consolidation right upper lobe  Micro Data:  5/22 SARS-COV2 > positive May 23 blood culture  Antimicrobials/COVID treatment  5/24 Actemra >  5/24 remdesivir >   Interim history/subjective:  Moved to intensive care unit this morning, as above  Objective   Blood pressure (!) 148/96, pulse 90, temperature 98.3 F (36.8 C), temperature source Axillary, resp. rate (!) 34, height 5\' 7"  (1.702 m), weight 72.6 kg, SpO2 92 %.    FiO2 (%):  [100 %] 100 %   Intake/Output Summary (Last 24 hours) at 10/13/2018 1150 Last data filed at 10/13/2018 1000 Gross per 24 hour  Intake 989.54 ml  Output 1001 ml  Net -11.46 ml   Filed Weights   28-Oct-2018 2035  Weight: 72.6 kg    Examination:  General:  Lying in prone position, tachypneic but not using accessory muscles, speaking in full sentences HENT: NCAT OP clear PULM: few crackles, mostly clear B, normal effort CV: RRR, no mgr GI: BS+, soft, nontender MSK: normal bulk and tone Neuro: awake, alert, speech clear, MAEW   Resolved Hospital Problem list     Assessment & Plan:  COVID-19 pneumonia causing acute respiratory failure with hypoxemia: Admit to ICU Continue Remdesivir Repeat Actemra 8mg /kg Administer oxygen to maintain O2 saturation greater than 85% Tolerate intermittent hypoxemia with movement, talking Add solumedrol, 0.5mg /kg IV (lower dose with hyperglycemia) Monitor for signs of respiratory failure: Nasal flaring, accessory muscle use, paradoxical breathing Would use signs of respiratory failure or change in mental status is an indication for intubation rather than hypoxemia Awake prone positioning as long as tolerated during the daytime, ideally 12 to 16 hours  Diabetes mellitus type 2: Hemoglobin A1c 10.7 on admission Sliding scale insulin  Best practice:  Diet: as tolerated Pain/Anxiety/Delirium protocol (if indicated): n/a VAP protocol (if indicated): n/a DVT prophylaxis: lovenox, increase dose to ICU protocol GI prophylaxis: n/a Glucose control: SSI Mobility: monitor Code Status: Full Family Communication: per [facility] Disposition: remain in ICU  Labs   CBC: Recent Labs  Lab 10-28-18 2039 10/12/18 0918 10/13/18 0500  WBC 11.9* 12.3* 11.6*  NEUTROABS 9.6* 10.0* 8.2*  HGB 13.9 12.3* 13.7  HCT 41.2 38.0* 41.4  MCV 88.8 91.8  90.2  PLT 183 198 222    Basic Metabolic Panel: Recent Labs  Lab 10/08/2018 2039 10/11/18 2002 10/12/18 0918 10/13/18 0500  NA 128* 129* 132* 136  K 3.5 4.0 3.5 3.2*  CL 94* 98 101 103  CO2 22 22 22 23   GLUCOSE 311* 375* 209* 180*  BUN 10 18 19 20   CREATININE 0.79 0.83 0.75 0.65  CALCIUM 8.8* 8.5* 8.0* 8.7*   MG  --  1.8  --   --    GFR: Estimated Creatinine Clearance: 83.8 mL/min (by C-G formula based on SCr of 0.65 mg/dL). Recent Labs  Lab 10/09/2018 2039 10/11/2018 2057 10/11/18 0548 10/12/18 0918 10/13/18 0500  PROCALCITON  --   --  0.12  --   --   WBC 11.9*  --   --  12.3* 11.6*  LATICACIDVEN  --  1.2  --   --   --     Liver Function Tests: Recent Labs  Lab 09/30/2018 2039 10/11/18 2002 10/12/18 0918 10/13/18 0500  AST 22 26 29  35  ALT 19 20 18 20   ALKPHOS 62 57 49 60  BILITOT 0.9 0.6 0.7 0.8  PROT 7.3 6.6 6.4* 6.9  ALBUMIN 3.8 3.2* 3.0* 3.0*   No results for input(s): LIPASE, AMYLASE in the last 168 hours. No results for input(s): AMMONIA in the last 168 hours.  ABG No results found for: PHART, PCO2ART, PO2ART, HCO3, TCO2, ACIDBASEDEF, O2SAT   Coagulation Profile: No results for input(s): INR, PROTIME in the last 168 hours.  Cardiac Enzymes: Recent Labs  Lab 10/12/18 0918 10/13/18 0500  CKTOTAL 330 256    HbA1C: Hgb A1c MFr Bld  Date/Time Value Ref Range Status  10/11/2018 05:29 AM 10.7 (H) 4.8 - 5.6 % Final    Comment:    (NOTE) Pre diabetes:          5.7%-6.4% Diabetes:              >6.4% Glycemic control for   <7.0% adults with diabetes     CBG: Recent Labs  Lab 10/11/18 2340 10/12/18 0351 10/12/18 1528 10/12/18 2103 10/13/18 0748  GLUCAP 285* 214* 308* 213* 163*    Review of Systems:   Cannot obtain due to severe hypoxemia  Past Medical History  He,  has no past medical history on file.   Surgical History   History reviewed. No pertinent surgical history.   Social History   reports that he has never smoked. He has never used smokeless tobacco. He reports previous alcohol use. He reports previous drug use.   Family History   His family history is not on file.   Allergies No Known Allergies   Home Medications  Prior to Admission medications   Not on File     Critical care time: 32 minutes    Heber CarolinaBrent McQuaid, MD [facility]  Pager: [phone] Cell: [phone] If no response, call [phone]

## 2018-10-13 NOTE — Progress Notes (Addendum)
Asked by Dr. Randol Kern to consent patient for convalescent plasma.  Chart reviewed, patient does meet inclusion criteria.  This MD speaks Hindi-spoke with patient, subsequently spoke with daughter and spouse over the phone.  Rationale-risks/benefits were all discussed.  MD went over consent form at bedside-RN present.  I left the paperwork for patient to read-subsequently was called by RN saying patient consented.  Have asked RN to place consent form in paper chart.  After consent was obtained, orders for convalescent plasma subsequently placed.  Total time spent equals 60 minutes  Time in: 2:45 PM Time out: 3:45 PM

## 2018-10-13 NOTE — Progress Notes (Signed)
Pt has continued to require more O2 and remained tachypneic throughout shift. Around 0515 pt desatted and remained in low 80s for >27mins on NRB proned. RR 40-50 shallow. Called rapid response and on call MD. Received orders to transfer pt to ICU. Communicated this with pt via interpreter and will call pts daughter with update on pts status. Mick Sell RN

## 2018-10-14 LAB — COMPREHENSIVE METABOLIC PANEL
ALT: 25 U/L (ref 0–44)
AST: 34 U/L (ref 15–41)
Albumin: 3 g/dL — ABNORMAL LOW (ref 3.5–5.0)
Alkaline Phosphatase: 61 U/L (ref 38–126)
Anion gap: 10 (ref 5–15)
BUN: 28 mg/dL — ABNORMAL HIGH (ref 8–23)
CO2: 24 mmol/L (ref 22–32)
Calcium: 8.6 mg/dL — ABNORMAL LOW (ref 8.9–10.3)
Chloride: 103 mmol/L (ref 98–111)
Creatinine, Ser: 0.76 mg/dL (ref 0.61–1.24)
GFR calc Af Amer: >60 mL/min (ref >60)
GFR calc non Af Amer: >60 mL/min (ref >60)
Glucose, Bld: 240 mg/dL — ABNORMAL HIGH (ref 70–99)
Potassium: 3.7 mmol/L (ref 3.5–5.1)
Sodium: 137 mmol/L (ref 135–145)
Total Bilirubin: 0.3 mg/dL (ref 0.3–1.2)
Total Protein: 7 g/dL (ref 6.5–8.1)

## 2018-10-14 LAB — GLUCOSE, CAPILLARY
Glucose-Capillary: 180 mg/dL — ABNORMAL HIGH (ref 70–99)
Glucose-Capillary: 188 mg/dL — ABNORMAL HIGH (ref 70–99)
Glucose-Capillary: 216 mg/dL — ABNORMAL HIGH (ref 70–99)
Glucose-Capillary: 225 mg/dL — ABNORMAL HIGH (ref 70–99)
Glucose-Capillary: 230 mg/dL — ABNORMAL HIGH (ref 70–99)
Glucose-Capillary: 262 mg/dL — ABNORMAL HIGH (ref 70–99)
Glucose-Capillary: 283 mg/dL — ABNORMAL HIGH (ref 70–99)
Glucose-Capillary: 284 mg/dL — ABNORMAL HIGH (ref 70–99)
Glucose-Capillary: 312 mg/dL — ABNORMAL HIGH (ref 70–99)
Glucose-Capillary: 401 mg/dL — ABNORMAL HIGH (ref 70–99)

## 2018-10-14 LAB — CBC WITH DIFFERENTIAL/PLATELET
Abs Immature Granulocytes: 0.03 10*3/uL (ref 0.00–0.07)
Basophils Absolute: 0 10*3/uL (ref 0.0–0.1)
Basophils Relative: 0 %
Eosinophils Absolute: 0 10*3/uL (ref 0.0–0.5)
Eosinophils Relative: 0 %
HCT: 41 % (ref 39.0–52.0)
Hemoglobin: 13.6 g/dL (ref 13.0–17.0)
Immature Granulocytes: 0 %
Lymphocytes Relative: 10 %
Lymphs Abs: 0.9 10*3/uL (ref 0.7–4.0)
MCH: 29.4 pg (ref 26.0–34.0)
MCHC: 33.2 g/dL (ref 30.0–36.0)
MCV: 88.7 fL (ref 80.0–100.0)
Monocytes Absolute: 0.3 10*3/uL (ref 0.1–1.0)
Monocytes Relative: 3 %
Neutro Abs: 8 10*3/uL — ABNORMAL HIGH (ref 1.7–7.7)
Neutrophils Relative %: 87 %
Platelets: 247 10*3/uL (ref 150–400)
RBC: 4.62 MIL/uL (ref 4.22–5.81)
RDW: 13.2 % (ref 11.5–15.5)
WBC: 9.2 10*3/uL (ref 4.0–10.5)
nRBC: 0 % (ref 0.0–0.2)

## 2018-10-14 LAB — BPAM FFP
Blood Product Expiration Date: 202005261942
ISSUE DATE / TIME: 202005251945
Unit Type and Rh: 7300

## 2018-10-14 LAB — MAGNESIUM: Magnesium: 2.1 mg/dL (ref 1.7–2.4)

## 2018-10-14 LAB — FERRITIN: Ferritin: 339 ng/mL — ABNORMAL HIGH (ref 24–336)

## 2018-10-14 LAB — PREPARE FRESH FROZEN PLASMA

## 2018-10-14 LAB — D-DIMER, QUANTITATIVE: D-Dimer, Quant: 0.74 ug/mL-FEU — ABNORMAL HIGH (ref 0.00–0.50)

## 2018-10-14 LAB — CK: Total CK: 117 U/L (ref 49–397)

## 2018-10-14 LAB — C-REACTIVE PROTEIN: CRP: 14.9 mg/dL — ABNORMAL HIGH (ref <1.0)

## 2018-10-14 MED ORDER — POTASSIUM CHLORIDE CRYS ER 20 MEQ PO TBCR
40.0000 meq | EXTENDED_RELEASE_TABLET | Freq: Once | ORAL | Status: AC
  Administered 2018-10-14: 40 meq via ORAL
  Filled 2018-10-14: qty 2

## 2018-10-14 MED ORDER — FUROSEMIDE 10 MG/ML IJ SOLN
60.0000 mg | Freq: Once | INTRAMUSCULAR | Status: AC
  Administered 2018-10-14: 60 mg via INTRAVENOUS
  Filled 2018-10-14: qty 6

## 2018-10-14 MED ORDER — INSULIN GLARGINE 100 UNIT/ML ~~LOC~~ SOLN
5.0000 [IU] | Freq: Every day | SUBCUTANEOUS | Status: DC
  Administered 2018-10-14 – 2018-10-23 (×10): 5 [IU] via SUBCUTANEOUS
  Filled 2018-10-14 (×12): qty 0.05

## 2018-10-14 MED ORDER — INSULIN GLARGINE 100 UNIT/ML ~~LOC~~ SOLN
15.0000 [IU] | Freq: Every day | SUBCUTANEOUS | Status: DC
  Administered 2018-10-14 – 2018-10-15 (×2): 15 [IU] via SUBCUTANEOUS
  Filled 2018-10-14 (×2): qty 0.15

## 2018-10-14 MED ORDER — INSULIN ASPART 100 UNIT/ML ~~LOC~~ SOLN
5.0000 [IU] | Freq: Three times a day (TID) | SUBCUTANEOUS | Status: DC
  Administered 2018-10-14 – 2018-10-15 (×3): 5 [IU] via SUBCUTANEOUS

## 2018-10-14 MED ORDER — INSULIN ASPART 100 UNIT/ML ~~LOC~~ SOLN
3.0000 [IU] | Freq: Three times a day (TID) | SUBCUTANEOUS | Status: DC
  Administered 2018-10-14: 08:00:00 3 [IU] via SUBCUTANEOUS

## 2018-10-14 NOTE — Progress Notes (Signed)
Pt's daughter updated. Pt then had an opportunity to speak with her.

## 2018-10-14 NOTE — Progress Notes (Signed)
PROGRESS NOTE    Austin Page  ERD:[id number] DOB: 06/14/1950 DOA: 11-01-18 PCP: Patient, [name]      Brief Narrative:   Austin Page is a 68 y.o. M with no significant PMHx who presented with 3 days fever, chills, cough, NBNB emesis and weakness as well as known COVID contact.    In ER, febrile, tachycardic, and tachypneic.  CTA chest showed peripheral ground glass opacities, but no hypoxia.  COVID+.  Had rapid deterioration of respiratory status, requiring 15 L HF nasal cannula,  so he has been transferred to ICU 5/25 am.    Subjective: No significant events overnight as discussed with staff, afebrile, denies any chest pain, reports his dyspnea is tolerable.  Assessment & Plan:  Acute hypoxic respiratory failure due to COVID-19 infection coronavirus pneumonitis with acute hypoxic respiratory failure In setting of ongoing 2020 COVID-19 pandemic. -No oxygen requirement at home, rapid progression of respiratory failure, initially on 2 L nasal cannula, has progressed to 15 L high flow nasal cannula, transferred to ICU 5/25, he remains on high-dose oxygen requirement, currently he is on combined NRB and 15 L high flow nasal cannula . -Continue to monitor in ICU -Is high risk for intubation, will try to avoid if possible -Continue with diuresis, has been diuresing well, 1.6 L over last 24 hours, will give another dose of 2 mg IV Lasix today. -Convulsant plasma 5/25 -He was encouraged to prone. -Received Actemra X2 5/24, 5/25 -Continue with IV Solu-Medrol  -Continue to follow inflammatory markers markers closely, ferritin trending up, CRP trending down,. -Remdesivir  started 10/12/2018 -Continue with zinc and vitamin C -IS and flutter -Procalcitonin within normal limit, no indication for antibiotics  COVID-19 Labs  Recent Labs    10/12/18 0918 10/13/18 0500 10/14/18 0215  DDIMER 0.49 0.90* 0.74*  FERRITIN 160 271 339*  CRP 19.5* 24.2* 14.9*    Lab Results   Component Value Date   SARSCOV2NAA POSITIVE (A) November 01, 2018     New onset diabetes -Hemoglobin A1c at 10.7 -BG remains uncontrolled despite starting Lantus and being aggressive sliding scale, will increase his Lantus today and start on scheduled 5 units NovoLog before meals .  And will add low-dose Lantus nightly as well.  Hyponatremia -Resolved  Hypokalemia - repleted, monitor closely as on IV diuresis    DVT prophylaxis: Lovenox Code Status: FULL Family Communication: Discussed with family yesterday, will call again today daughter at [phone],   Consultants:   PCCM  Procedures:   CTA chest -- no PE, but bilateral GGO  Antimicrobials:   Remdesivir 5/24 >>    Objective: Vitals:   10/14/18 0910 10/14/18 1000 10/14/18 1100 10/14/18 1155  BP: (!) 158/96 (!) 157/97 (!) 132/99 (!) 132/99  Pulse: (!) 111 100 90 99  Resp: (!) 34 (!) 37 (!) 28 (!) 32  Temp:      TempSrc:      SpO2: 92% 93% 95% 94%  Weight:      Height:        Intake/Output Summary (Last 24 hours) at 10/14/2018 1348 Last data filed at 10/14/2018 1100 Gross per 24 hour  Intake 958.51 ml  Output 3850 ml  Net -2891.49 ml   Filed Weights   11/01/2018 2035  Weight: 72.6 kg    Examination:  Awake Alert, Oriented X 3, No new F.N deficits, Normal affect, pleasant, resting in bed comfortably Symmetrical Chest wall movement, Good air movement bilaterally, CTAB RRR,No Gallops,Rubs or new Murmurs, No Parasternal Heave +  ve B.Sounds, Abd Soft, No tenderness, No rebound - guarding or rigidity. No Cyanosis, Clubbing or edema, No new Rash or bruise      Data Reviewed: I have personally reviewed following labs and imaging studies:  CBC: Recent Labs  Lab 11-02-18 2039 10/12/18 0918 10/13/18 0500 10/14/18 0215  WBC 11.9* 12.3* 11.6* 9.2  NEUTROABS 9.6* 10.0* 8.2* 8.0*  HGB 13.9 12.3* 13.7 13.6  HCT 41.2 38.0* 41.4 41.0  MCV 88.8 91.8 90.2 88.7  PLT 183 198 222 247   Basic Metabolic Panel:  Recent Labs  Lab 11-02-18 2039 10/11/18 2002 10/12/18 0918 10/13/18 0500 10/14/18 0215  NA 128* 129* 132* 136 137  K 3.5 4.0 3.5 3.2* 3.7  CL 94* 98 101 103 103  CO2 GLUCOSE 311* 375* 209* 180* 240*  BUN 28*  CREATININE 0.79 0.83 0.75 0.65 0.76  CALCIUM 8.8* 8.5* 8.0* 8.7* 8.6*  MG  --  1.8  --   --  2.1   GFR: Estimated Creatinine Clearance: 83.8 mL/min (by C-G formula based on SCr of 0.76 mg/dL). Liver Function Tests: Recent Labs  Lab 11/02/2018 2039 10/11/18 2002 10/12/18 0918 10/13/18 0500 10/14/18 0215  AST 35 34  ALT ALKPHOS 62 57 49 60 61  BILITOT 0.9 0.6 0.7 0.8 0.3  PROT 7.3 6.6 6.4* 6.9 7.0  ALBUMIN 3.8 3.2* 3.0* 3.0* 3.0*   No results for input(s): LIPASE, AMYLASE in the last 168 hours. No results for input(s): AMMONIA in the last 168 hours. Coagulation Profile: No results for input(s): INR, PROTIME in the last 168 hours. Cardiac Enzymes: Recent Labs  Lab 10/12/18 0918 10/13/18 0500 10/14/18 0215  CKTOTAL 330 256 117   BNP (last 3 results) No results for input(s): PROBNP in the last 8760 hours. HbA1C: No results for input(s): HGBA1C in the last 72 hours. CBG: Recent Labs  Lab 10/13/18 2102 10/13/18 2328 10/14/18 0432 10/14/18 0752 10/14/18 1125  GLUCAP 283* 294* 230* 216* 262*   Lipid Profile: Recent Labs    10/11/18 2002  CHOL 147  HDL 34*  LDLCALC 96  TRIG 86  CHOLHDL 4.3   Thyroid Function Tests: No results for input(s): TSH, T4TOTAL, FREET4, T3FREE, THYROIDAB in the last 72 hours. Anemia Panel: Recent Labs    10/13/18 0500 10/14/18 0215  FERRITIN 271 339*   Urine analysis: No results found for: COLORURINE, APPEARANCEUR, LABSPEC, PHURINE, GLUCOSEU, HGBUR, BILIRUBINUR, KETONESUR, PROTEINUR, UROBILINOGEN, NITRITE, LEUKOCYTESUR Sepsis Labs: (procalcitonin:4,lacticacidven:4)  ) Recent Results (from the past 240 hour(s))  SARS Coronavirus 2 (CEPHEID- Performed  in [facility] hospital lab), Hosp Order     Status: Abnormal   Collection Time: 2018-11-02  8:44 PM  Result Value Ref Range Status   SARS Coronavirus 2 POSITIVE (A) NEGATIVE Final    Comment: RESULT CALLED TO, READ BACK BY AND VERIFIED WITH: L CHILTON RN 11/02/18 2232 JDW (NOTE) If result is NEGATIVE SARS-CoV-2 target nucleic acids are NOT DETECTED. The SARS-CoV-2 RNA is generally detectable in upper and lower  respiratory specimens during the acute phase of infection. The lowest  concentration of SARS-CoV-2 viral copies this assay can detect is 250  copies / mL. A negative result does not preclude SARS-CoV-2 infection  and should not be used as the sole basis for treatment or other  patient management decisions.  A negative result may occur with  improper specimen collection / handling, submission of specimen  other  than nasopharyngeal swab, presence of viral mutation(s) within the  areas targeted by this assay, and inadequate number of viral copies  (<250 copies / mL). A negative result must be combined with clinical  observations, patient history, and epidemiological information. If result is POSITIVE SARS-CoV-2 target nucleic acids are DETECTED. The SAR S-CoV-2 RNA is generally detectable in upper and lower  respiratory specimens during the acute phase of infection.  Positive  results are indicative of active infection with SARS-CoV-2.  Clinical  correlation with patient history and other diagnostic information is  necessary to determine patient infection status.  Positive results do  not rule out bacterial infection or co-infection with other viruses. If result is PRESUMPTIVE POSTIVE SARS-CoV-2 nucleic acids MAY BE PRESENT.   A presumptive positive result was obtained on the submitted specimen  and confirmed on repeat testing.  While 2019 novel coronavirus  (SARS-CoV-2) nucleic acids may be present in the submitted sample  additional confirmatory testing may be necessary for  epidemiological  and / or clinical management purposes  to differentiate between  SARS-CoV-2 and other Sarbecovirus currently known to infect humans.  If clinically indicated additional testing with an alternate test  methodology [phone]([id number]) is advis ed. The SARS-CoV-2 RNA is generally  detectable in upper and lower respiratory specimens during the acute  phase of infection. The expected result is Negative. Fact Sheet for Patients:  BoilerBrush.com.cyhttps://www.fda.gov/media/136312/download Fact Sheet for Healthcare Providers: https://pope.com/https://www.fda.gov/media/136313/download This test is not yet approved or cleared by the [place] FDA and has been authorized for detection and/or diagnosis of SARS-CoV-2 by FDA under an Emergency Use Authorization (EUA).  This EUA will remain in effect (meaning this test can be used) for the duration of the COVID-19 declaration under Section 564(b)(1) of the Act, 21 U.S.C. section 360bbb-3(b)(1), unless the authorization is terminated or revoked sooner. Performed at [facility], [address] [place], [place] [address]   Culture, blood (Routine X 2) w Reflex to ID Panel     Status: None (Preliminary result)   Collection Time: 10/11/18  5:30 AM  Result Value Ref Range Status   Specimen Description BLOOD LEFT HAND  Final   Special Requests   Final    BOTTLES DRAWN AEROBIC AND ANAEROBIC Blood Culture adequate volume   Culture   Final    NO GROWTH 3 DAYS Performed at [facility], [address] [place], [place] [address]    Report Status PENDING  Incomplete  Culture, blood (Routine X 2) w Reflex to ID Panel     Status: None (Preliminary result)   Collection Time: 10/11/18  5:48 AM  Result Value Ref Range Status   Specimen Description BLOOD RIGHT HAND  Final   Special Requests   Final    BOTTLES DRAWN AEROBIC ONLY Blood Culture results may not be optimal due to an inadequate volume of blood received in culture bottles   Culture   Final    NO GROWTH 3  DAYS Performed at [facility], [address] [place], [place] [address]    Report Status PENDING  Incomplete         Radiology Studies: No results found.      Scheduled Meds: . sodium chloride   Intravenous Once  . enoxaparin (LOVENOX) injection  40 mg Subcutaneous Q12H  . insulin aspart  0-15 Units Subcutaneous Q4H  . insulin aspart  3 Units Subcutaneous TID WC  . insulin glargine  15 Units Subcutaneous Daily  .  methylPREDNISolone (SOLU-MEDROL) injection  30 mg Intravenous Daily  . sodium chloride flush  3 mL Intravenous Q12H   Continuous Infusions: . sodium chloride    . remdesivir 100 mg in NS 250 mL       LOS: 3 days    Huey Bienenstock, MD [facility] 10/14/2018, 1:48 PM     Please page through AMION:  [email or web address] Password [email or web address] If 7PM-7AM, please contact night-coverage

## 2018-10-14 NOTE — Progress Notes (Signed)
NAME:  Austin Page, MRN:  [id number], DOB:  1951-04-17, LOS: 3 ADMISSION DATE:  Apr 04, 2019, CONSULTATION DATE:  10/12/2017 REFERRING MD:  Maryfrances Bunnellanford, CHIEF COMPLAINT:  Dyspnea   Brief History   68 year old male admitted on May 22 for hypoxemic respiratory failure secondary to COVID-19 pneumonia.  Transferred to the intensive care unit early a.m. May 25 for worsening hypoxemia.   Past Medical History  Possible DM2  Significant Hospital Events   May 22 admission May 25 moved to ICU  Consults:  Pulmonary  Procedures:    Significant Diagnostic Tests:  Oct 13, 2018 CT angiogram chest showed no pulmonary embolism, consolidation right upper lobe  Micro Data:  5/22 SARS-COV2 > positive May 23 blood culture  Antimicrobials/COVID treatment  5/24 Actemra >  5/24 remdesivir >    Interim history/subjective:  Work of breathing has been stable overnight Oxygenation remains high Comfortable in bed  Objective   Blood pressure (!) 150/89, pulse 97, temperature 97.9 F (36.6 C), temperature source Oral, resp. rate (!) 33, height 5\' 7"  (1.702 m), weight 72.6 kg, SpO2 96 %.    FiO2 (%):  [100 %] 100 %   Intake/Output Summary (Last 24 hours) at 10/14/2018 0700 Last data filed at 10/14/2018 0430 Gross per 24 hour  Intake 1108.51 ml  Output 2800 ml  Net -1691.49 ml   Filed Weights   03/20/2019 2035  Weight: 72.6 kg    Examination:  General:  Tachypneic, but resting comfortably in bed, smiling HENT: NCAT OP clear PULM: few crackles B, normal effort CV: RRR, no mgr GI: BS+, soft, nontender MSK: normal bulk and tone Neuro: awake, alert, no distress, MAEW   Resolved Hospital Problem list     Assessment & Plan:  COVID-19 pneumonia causing acute respiratory failure with hypoxemia: Monitor in ICU Change to heated high flow oxygen today Target O2 saturation in general above 85% Intubation decision should be made around signs of respiratory failure such as accessory  muscle use, paradoxical breathing or change in mental status Awake prone positioning as tolerated Continue low dose methylprednisolone  Diabetes mellitus type 2: Hemoglobin A1c 10.7 on admission SSI  Best practice:  Diet: as tolerated Pain/Anxiety/Delirium protocol (if indicated): n/a VAP protocol (if indicated): n/a DVT prophylaxis: lovenox bid GI prophylaxis: n/a Glucose control: SSI Mobility: monitor Code Status: Full Family Communication: per [facility] Disposition: remain in ICU  Labs   CBC: Recent Labs  Lab 03/20/2019 2039 10/12/18 0918 10/13/18 0500 10/14/18 0215  WBC 11.9* 12.3* 11.6* 9.2  NEUTROABS 9.6* 10.0* 8.2* 8.0*  HGB 13.9 12.3* 13.7 13.6  HCT 41.2 38.0* 41.4 41.0  MCV 88.8 91.8 90.2 88.7  PLT 183 198 222 247    Basic Metabolic Panel: Recent Labs  Lab 03/20/2019 2039 10/11/18 2002 10/12/18 0918 10/13/18 0500 10/14/18 0215  NA 128* 129* 132* 136 137  K 3.5 4.0 3.5 3.2* 3.7  CL 94* 98 101 103 103  CO2 22 22 22 23 24   GLUCOSE 311* 375* 209* 180* 240*  BUN 10 18 19 20  28*  CREATININE 0.79 0.83 0.75 0.65 0.76  CALCIUM 8.8* 8.5* 8.0* 8.7* 8.6*  MG  --  1.8  --   --  2.1   GFR: Estimated Creatinine Clearance: 83.8 mL/min (by C-G formula based on SCr of 0.76 mg/dL). Recent Labs  Lab 03/20/2019 2039 03/20/2019 2057 10/11/18 0548 10/12/18 0918 10/13/18 0500 10/14/18 0215  PROCALCITON  --   --  0.12  --   --   --  WBC 11.9*  --   --  12.3* 11.6* 9.2  LATICACIDVEN  --  1.2  --   --   --   --     Liver Function Tests: Recent Labs  Lab 09/26/2018 2039 10/11/18 2002 10/12/18 0918 10/13/18 0500 10/14/18 0215  AST 22 26 29  35 34  ALT 19 20 18 20 25   ALKPHOS 62 57 49 60 61  BILITOT 0.9 0.6 0.7 0.8 0.3  PROT 7.3 6.6 6.4* 6.9 7.0  ALBUMIN 3.8 3.2* 3.0* 3.0* 3.0*   No results for input(s): LIPASE, AMYLASE in the last 168 hours. No results for input(s): AMMONIA in the last 168 hours.  ABG No results found for: PHART, PCO2ART, PO2ART, HCO3, TCO2,  ACIDBASEDEF, O2SAT   Coagulation Profile: No results for input(s): INR, PROTIME in the last 168 hours.  Cardiac Enzymes: Recent Labs  Lab 10/12/18 0918 10/13/18 0500 10/14/18 0215  CKTOTAL 330 256 117    HbA1C: Hgb A1c MFr Bld  Date/Time Value Ref Range Status  10/11/2018 05:29 AM 10.7 (H) 4.8 - 5.6 % Final    Comment:    (NOTE) Pre diabetes:          5.7%-6.4% Diabetes:              >6.4% Glycemic control for   <7.0% adults with diabetes     CBG: Recent Labs  Lab 10/13/18 0748 10/13/18 1208 10/13/18 1623 10/13/18 2328 10/14/18 0432  GLUCAP 163* 188* 184* 294* 230*     Critical care time: 31 minutes    Heber [place], MD [place] [facility] Pager: [phone] Cell: [phone] If no response, call [phone]

## 2018-10-15 DIAGNOSIS — R739 Hyperglycemia, unspecified: Secondary | ICD-10-CM

## 2018-10-15 DIAGNOSIS — E871 Hypo-osmolality and hyponatremia: Secondary | ICD-10-CM

## 2018-10-15 LAB — CBC WITH DIFFERENTIAL/PLATELET
Abs Immature Granulocytes: 0.06 10*3/uL (ref 0.00–0.07)
Basophils Absolute: 0 10*3/uL (ref 0.0–0.1)
Basophils Relative: 0 %
Eosinophils Absolute: 0 10*3/uL (ref 0.0–0.5)
Eosinophils Relative: 0 %
HCT: 41.7 % (ref 39.0–52.0)
Hemoglobin: 13.9 g/dL (ref 13.0–17.0)
Immature Granulocytes: 1 %
Lymphocytes Relative: 9 %
Lymphs Abs: 0.9 10*3/uL (ref 0.7–4.0)
MCH: 29.4 pg (ref 26.0–34.0)
MCHC: 33.3 g/dL (ref 30.0–36.0)
MCV: 88.3 fL (ref 80.0–100.0)
Monocytes Absolute: 0.8 10*3/uL (ref 0.1–1.0)
Monocytes Relative: 7 %
Neutro Abs: 9 10*3/uL — ABNORMAL HIGH (ref 1.7–7.7)
Neutrophils Relative %: 83 %
Platelets: 294 10*3/uL (ref 150–400)
RBC: 4.72 MIL/uL (ref 4.22–5.81)
RDW: 13.3 % (ref 11.5–15.5)
WBC: 10.8 10*3/uL — ABNORMAL HIGH (ref 4.0–10.5)
nRBC: 0 % (ref 0.0–0.2)

## 2018-10-15 LAB — GLUCOSE, CAPILLARY
Glucose-Capillary: 165 mg/dL — ABNORMAL HIGH (ref 70–99)
Glucose-Capillary: 165 mg/dL — ABNORMAL HIGH (ref 70–99)
Glucose-Capillary: 262 mg/dL — ABNORMAL HIGH (ref 70–99)
Glucose-Capillary: 250 mg/dL — ABNORMAL HIGH (ref 70–99)
Glucose-Capillary: 305 mg/dL — ABNORMAL HIGH (ref 70–99)

## 2018-10-15 LAB — COMPREHENSIVE METABOLIC PANEL
ALT: 27 U/L (ref 0–44)
AST: 36 U/L (ref 15–41)
Albumin: 3.1 g/dL — ABNORMAL LOW (ref 3.5–5.0)
Alkaline Phosphatase: 65 U/L (ref 38–126)
Anion gap: 9 (ref 5–15)
BUN: 33 mg/dL — ABNORMAL HIGH (ref 8–23)
CO2: 27 mmol/L (ref 22–32)
Calcium: 8.8 mg/dL — ABNORMAL LOW (ref 8.9–10.3)
Chloride: 101 mmol/L (ref 98–111)
Creatinine, Ser: 0.66 mg/dL (ref 0.61–1.24)
GFR calc Af Amer: >60 mL/min (ref >60)
GFR calc non Af Amer: >60 mL/min (ref >60)
Glucose, Bld: 190 mg/dL — ABNORMAL HIGH (ref 70–99)
Potassium: 3.9 mmol/L (ref 3.5–5.1)
Sodium: 137 mmol/L (ref 135–145)
Total Bilirubin: 0.3 mg/dL (ref 0.3–1.2)
Total Protein: 6.9 g/dL (ref 6.5–8.1)

## 2018-10-15 LAB — FERRITIN: Ferritin: 264 ng/mL (ref 24–336)

## 2018-10-15 LAB — D-DIMER, QUANTITATIVE: D-Dimer, Quant: 0.55 ug/mL-FEU — ABNORMAL HIGH (ref 0.00–0.50)

## 2018-10-15 LAB — C-REACTIVE PROTEIN: CRP: 8.4 mg/dL — ABNORMAL HIGH (ref <1.0)

## 2018-10-15 MED ORDER — FUROSEMIDE 10 MG/ML IJ SOLN
60.0000 mg | Freq: Once | INTRAMUSCULAR | Status: AC
  Administered 2018-10-15: 60 mg via INTRAVENOUS
  Filled 2018-10-15: qty 6

## 2018-10-15 MED ORDER — INSULIN ASPART 100 UNIT/ML ~~LOC~~ SOLN
8.0000 [IU] | Freq: Three times a day (TID) | SUBCUTANEOUS | Status: DC
  Administered 2018-10-15 – 2018-10-17 (×5): 8 [IU] via SUBCUTANEOUS

## 2018-10-15 MED ORDER — INSULIN GLARGINE 100 UNIT/ML ~~LOC~~ SOLN
20.0000 [IU] | Freq: Every day | SUBCUTANEOUS | Status: DC
  Administered 2018-10-16 – 2018-10-22 (×7): 20 [IU] via SUBCUTANEOUS
  Filled 2018-10-15 (×8): qty 0.2

## 2018-10-15 NOTE — Progress Notes (Signed)
Herry Dash called for an update on patients status.  Updated family member and also allowed patient to facetime via iphone with his wife. All questions answered.

## 2018-10-15 NOTE — Progress Notes (Signed)
Pt found on 15 L NRB and 5L HFNC. Pt denies SOB, no increased WOB, VS within normal limits. Pt taken off NRB and HFNC increased to 12 L. Pt states this is much better for him so he can suction his mouth and drink water. RN aware and will put NRB back on pt if needed

## 2018-10-15 NOTE — Progress Notes (Signed)
NAME:  Austin Page, MRN:  [id number], DOB:  05-22-1950, LOS: 4 ADMISSION DATE:  09/20/2018, CONSULTATION DATE:  10/12/2017 REFERRING MD:  Maryfrances Bunnell, CHIEF COMPLAINT:  Dyspnea   Brief History   68 year old male admitted on May 22 for hypoxemic respiratory failure secondary to COVID-19 pneumonia.  Transferred to the intensive care unit early a.m. May 25 for worsening hypoxemia.  Past Medical History  Possible DM2  Significant Hospital Events   May 22 admission May 25 moved to ICU  Consults:  Pulmonary  Procedures:    Significant Diagnostic Tests:  Oct 13, 2018 CT angiogram chest showed no pulmonary embolism, consolidation right upper lobe  Micro Data:  5/22 SARS-COV2 > positive May 23 blood culture  Antimicrobials/COVID treatment  5/24 Actemra >  5/24 remdesivir >    Interim history/subjective:   Work of breathing, oxygenation has improved  Objective   Blood pressure (!) 151/89, pulse (!) 102, temperature 98.1 F (36.7 C), temperature source Oral, resp. rate (!) 27, height 5\' 7"  (1.702 m), weight 72.6 kg, SpO2 (!) 84 %.    FiO2 (%):  [100 %] 100 %   Intake/Output Summary (Last 24 hours) at 10/15/2018 0843 Last data filed at 10/15/2018 0600 Gross per 24 hour  Intake 970 ml  Output 2660 ml  Net -1690 ml   Filed Weights   10/03/2018 2035  Weight: 72.6 kg    Examination:  General:  Resting comfortably in bed HENT: NCAT OP clear PULM: Crackles in bases B, normal effort CV: RRR, no mgr GI: BS+, soft, nontender MSK: normal bulk and tone Neuro: awake, alert, no distress, MAEW   Resolved Hospital Problem list     Assessment & Plan:  COVID-19 pneumonia causing acute respiratory failure with hypoxemia: Continue to monitor in ICU Continue heated high flow oxygen today Monitor in ICU Target O2 saturation > 85% Out of bed today, tolerate desaturation with moving PT consult Awake prone positioning Low dose methylprednisolone  Diabetes mellitus type  2: Hemoglobin A1c 10.7 on admission SSI  Best practice:  Diet: as tolerated Pain/Anxiety/Delirium protocol (if indicated): n/a VAP protocol (if indicated): n/a DVT prophylaxis: lovenox bid GI prophylaxis: n/a Glucose control: SSI Mobility: monitor Code Status: Full Family Communication: per [facility] Disposition: remain in ICU  Labs   CBC: Recent Labs  Lab 10/12/2018 2039 10/12/18 0918 10/13/18 0500 10/14/18 0215 10/15/18 0200  WBC 11.9* 12.3* 11.6* 9.2 10.8*  NEUTROABS 9.6* 10.0* 8.2* 8.0* 9.0*  HGB 13.9 12.3* 13.7 13.6 13.9  HCT 41.2 38.0* 41.4 41.0 41.7  MCV 88.8 91.8 90.2 88.7 88.3  PLT 183 198 222 247 294    Basic Metabolic Panel: Recent Labs  Lab 10/11/18 2002 10/12/18 0918 10/13/18 0500 10/14/18 0215 10/15/18 0200  NA 129* 132* 136 137 137  K 4.0 3.5 3.2* 3.7 3.9  CL 98 101 103 103 101  CO2 22 22 23 24 27   GLUCOSE 375* 209* 180* 240* 190*  BUN 18 19 20  28* 33*  CREATININE 0.83 0.75 0.65 0.76 0.66  CALCIUM 8.5* 8.0* 8.7* 8.6* 8.8*  MG 1.8  --   --  2.1  --    GFR: Estimated Creatinine Clearance: 83.8 mL/min (by C-G formula based on SCr of 0.66 mg/dL). Recent Labs  Lab 10/02/2018 2057 10/11/18 0548 10/12/18 0918 10/13/18 0500 10/14/18 0215 10/15/18 0200  PROCALCITON  --  0.12  --   --   --   --   WBC  --   --  12.3* 11.6* 9.2 10.8*  LATICACIDVEN 1.2  --   --   --   --   --     Liver Function Tests: Recent Labs  Lab 10/11/18 2002 10/12/18 0918 10/13/18 0500 10/14/18 0215 10/15/18 0200  AST 26 29 35 34 36  ALT 20 18 20 25 27   ALKPHOS 57 49 60 61 65  BILITOT 0.6 0.7 0.8 0.3 0.3  PROT 6.6 6.4* 6.9 7.0 6.9  ALBUMIN 3.2* 3.0* 3.0* 3.0* 3.1*   No results for input(s): LIPASE, AMYLASE in the last 168 hours. No results for input(s): AMMONIA in the last 168 hours.  ABG No results found for: PHART, PCO2ART, PO2ART, HCO3, TCO2, ACIDBASEDEF, O2SAT   Coagulation Profile: No results for input(s): INR, PROTIME in the last 168 hours.  Cardiac  Enzymes: Recent Labs  Lab 10/12/18 0918 10/13/18 0500 10/14/18 0215  CKTOTAL 330 256 117    HbA1C: Hgb A1c MFr Bld  Date/Time Value Ref Range Status  10/11/2018 05:29 AM 10.7 (H) 4.8 - 5.6 % Final    Comment:    (NOTE) Pre diabetes:          5.7%-6.4% Diabetes:              >6.4% Glycemic control for   <7.0% adults with diabetes     CBG: Recent Labs  Lab 10/14/18 1624 10/14/18 1939 10/14/18 2347 10/15/18 0351 10/15/18 0747  GLUCAP 284* 312* 188* 165* 165*     Critical care time: 31 minutes    Heber CarolinaBrent McQuaid, MD [place] [facility] Pager: [phone] Cell: [phone] If no response, call [phone]

## 2018-10-15 NOTE — Progress Notes (Signed)
Patient proned at 1300. O2 sats instantly improved from upper 80s to upper 90s after positioning. Patient tolerated for about an hour and then turned onto his left side by himself. Patient requesting to keep NRB mask on at this time. Work of breathing improved.

## 2018-10-15 NOTE — Progress Notes (Signed)
PROGRESS NOTE    MILLEDGE GERDING  ZOX:[id number] DOB: 24-Sep-1950 DOA: 10/03/2018 PCP: Patient, [name]      Brief Narrative:   Mr. Vossler is a 68 y.o. M with no significant PMHx who presented with 3 days fever, chills, cough, NBNB emesis and weakness as well as known COVID contact.    In ER, febrile, tachycardic, and tachypneic.  CTA chest showed peripheral ground glass opacities, but no hypoxia.  COVID+.  Had rapid deterioration of respiratory status, requiring 15 L HF nasal cannula,  so he has been transferred to ICU 5/25 am.    Subjective: No significant events overnight, he denies any complaints, no chest pain, he is afebrile .  assessment & Plan:  Acute hypoxic respiratory failure due to COVID-19 infection coronavirus pneumonitis with acute hypoxic respiratory failure In setting of ongoing 2020 COVID-19 pandemic. -No oxygen requirement at home, rapid progression of respiratory failure, initially on 2 L nasal cannula, has progressed to 15 L high flow nasal cannula, transferred to ICU 5/25, he remains on high-dose oxygen requirement,he is on combined NRB and 15 L high flow nasal cannula 5/26, at 12 L high flow nasal cannula today. -Continue to monitor in ICU -Is high risk for intubation, will try to avoid if possible -Continue with diuresis, is -1.6 L over last 24 hours, has been diuresing well, -1.6 L over last 24 hours, another dose of 60 mg IV Lasix today. -Convulsant plasma 5/25 -He was encouraged to prone. -Received Actemra X2 5/24, 5/25 -Continue with IV Solu-Medrol  -Continue to follow inflammatory markers markers closely, ferritin trending up, CRP trending down,. -Remdesivir  started 10/12/2018 -Continue with zinc and vitamin C -IS and flutter -Procalcitonin within normal limit, no indication for antibiotics  COVID-19 Labs  Recent Labs    10/13/18 0500 10/14/18 0215 10/15/18 0200  DDIMER 0.90* 0.74* 0.55*  FERRITIN 271 339* 264  CRP 24.2* 14.9* 8.4*     Lab Results  Component Value Date   SARSCOV2NAA POSITIVE (A) 10/05/2018     New onset diabetes -Hemoglobin A1c at 10.7 -CBG remains uncontrolled, increase his Lantus from 8 to 15 units, and pre-meal NovoLog from 5-8, continue with insulin sliding scale .  Hyponatremia -Resolved  Hypokalemia - repleted, monitor closely as on IV diuresis    DVT prophylaxis: Lovenox Code Status: FULL Family Communication: Discussed with family yesterday, will call again today daughter at [phone],   Consultants:   PCCM  Procedures:   CTA chest -- no PE, but bilateral GGO  Antimicrobials:   Remdesivir 5/24 >>    Objective: Vitals:   10/15/18 1300 10/15/18 1400 10/15/18 1500 10/15/18 1523  BP: (!) 161/87 133/77 133/78 133/78  Pulse: (!) 105 91 (!) 104 99  Resp: (!) 31   (!) 28  Temp:   97.7 F (36.5 C)   TempSrc:   Oral   SpO2: (!) 82% 100% 93% 92%  Weight:      Height:        Intake/Output Summary (Last 24 hours) at 10/15/2018 1604 Last data filed at 10/15/2018 1500 Gross per 24 hour  Intake 740.15 ml  Output 1690 ml  Net -949.85 ml   Filed Weights   10/19/2018 2035  Weight: 72.6 kg    Examination:  Awake Alert, Oriented X 3, No new F.N deficits, Normal affect, pleasant, resting in bed comfortably Symmetrical Chest wall movement, Good air movement bilaterally, CTAB RRR,No Gallops,Rubs or new Murmurs, No Parasternal Heave +ve B.Sounds, Abd Soft, No  tenderness, No rebound - guarding or rigidity. No Cyanosis, Clubbing or edema, No new Rash or bruise      Data Reviewed: I have personally reviewed following labs and imaging studies:  CBC: Recent Labs  Lab October 31, 2018 2039 10/12/18 0918 10/13/18 0500 10/14/18 0215 10/15/18 0200  WBC 11.9* 12.3* 11.6* 9.2 10.8*  NEUTROABS 9.6* 10.0* 8.2* 8.0* 9.0*  HGB 13.9 12.3* 13.7 13.6 13.9  HCT 41.2 38.0* 41.4 41.0 41.7  MCV 88.8 91.8 90.2 88.7 88.3  PLT 183 198 222 247 294   Basic Metabolic Panel: Recent Labs  Lab  10/11/18 2002 10/12/18 0918 10/13/18 0500 10/14/18 0215 10/15/18 0200  NA 129* 132* 136 137 137  K 4.0 3.5 3.2* 3.7 3.9  CL 98 101 103 103 101  CO2 22 22 23 24 27   GLUCOSE 375* 209* 180* 240* 190*  BUN 18 19 20  28* 33*  CREATININE 0.83 0.75 0.65 0.76 0.66  CALCIUM 8.5* 8.0* 8.7* 8.6* 8.8*  MG 1.8  --   --  2.1  --    GFR: Estimated Creatinine Clearance: 83.8 mL/min (by C-G formula based on SCr of 0.66 mg/dL). Liver Function Tests: Recent Labs  Lab 10/11/18 2002 10/12/18 0918 10/13/18 0500 10/14/18 0215 10/15/18 0200  AST 26 29 35 34 36  ALT 20 18 20 25 27   ALKPHOS 57 49 60 61 65  BILITOT 0.6 0.7 0.8 0.3 0.3  PROT 6.6 6.4* 6.9 7.0 6.9  ALBUMIN 3.2* 3.0* 3.0* 3.0* 3.1*   No results for input(s): LIPASE, AMYLASE in the last 168 hours. No results for input(s): AMMONIA in the last 168 hours. Coagulation Profile: No results for input(s): INR, PROTIME in the last 168 hours. Cardiac Enzymes: Recent Labs  Lab 10/12/18 0918 10/13/18 0500 10/14/18 0215  CKTOTAL 330 256 117   BNP (last 3 results) No results for input(s): PROBNP in the last 8760 hours. HbA1C: No results for input(s): HGBA1C in the last 72 hours. CBG: Recent Labs  Lab 10/14/18 2347 10/15/18 0351 10/15/18 0747 10/15/18 1148 10/15/18 1543  GLUCAP 188* 165* 165* 262* 250*   Lipid Profile: No results for input(s): CHOL, HDL, LDLCALC, TRIG, CHOLHDL, LDLDIRECT in the last 72 hours. Thyroid Function Tests: No results for input(s): TSH, T4TOTAL, FREET4, T3FREE, THYROIDAB in the last 72 hours. Anemia Panel: Recent Labs    10/14/18 0215 10/15/18 0200  FERRITIN 339* 264   Urine analysis: No results found for: COLORURINE, APPEARANCEUR, LABSPEC, PHURINE, GLUCOSEU, HGBUR, BILIRUBINUR, KETONESUR, PROTEINUR, UROBILINOGEN, NITRITE, LEUKOCYTESUR Sepsis Labs: @LABRCNTIP (procalcitonin:4,lacticacidven:4)  ) Recent Results (from the past 240 hour(s))  SARS Coronavirus 2 (CEPHEID- Performed in [facility]  hospital lab), Hosp Order     Status: Abnormal   Collection Time: October 31, 2018  8:44 PM  Result Value Ref Range Status   SARS Coronavirus 2 POSITIVE (A) NEGATIVE Final    Comment: RESULT CALLED TO, READ BACK BY AND VERIFIED WITH: L CHILTON RN 10-31-18 2232 JDW (NOTE) If result is NEGATIVE SARS-CoV-2 target nucleic acids are NOT DETECTED. The SARS-CoV-2 RNA is generally detectable in upper and lower  respiratory specimens during the acute phase of infection. The lowest  concentration of SARS-CoV-2 viral copies this assay can detect is 250  copies / mL. A negative result does not preclude SARS-CoV-2 infection  and should not be used as the sole basis for treatment or other  patient management decisions.  A negative result may occur with  improper specimen collection / handling, submission of specimen other  than nasopharyngeal swab, presence  of viral mutation(s) within the  areas targeted by this assay, and inadequate number of viral copies  (<250 copies / mL). A negative result must be combined with clinical  observations, patient history, and epidemiological information. If result is POSITIVE SARS-CoV-2 target nucleic acids are DETECTED. The SAR S-CoV-2 RNA is generally detectable in upper and lower  respiratory specimens during the acute phase of infection.  Positive  results are indicative of active infection with SARS-CoV-2.  Clinical  correlation with patient history and other diagnostic information is  necessary to determine patient infection status.  Positive results do  not rule out bacterial infection or co-infection with other viruses. If result is PRESUMPTIVE POSTIVE SARS-CoV-2 nucleic acids MAY BE PRESENT.   A presumptive positive result was obtained on the submitted specimen  and confirmed on repeat testing.  While 2019 novel coronavirus  (SARS-CoV-2) nucleic acids may be present in the submitted sample  additional confirmatory testing may be necessary for epidemiological   and / or clinical management purposes  to differentiate between  SARS-CoV-2 and other Sarbecovirus currently known to infect humans.  If clinically indicated additional testing with an alternate test  methodology [phone]) is advis ed. The SARS-CoV-2 RNA is generally  detectable in upper and lower respiratory specimens during the acute  phase of infection. The expected result is Negative. Fact Sheet for Patients:  BoilerBrush.com.cy Fact Sheet for Healthcare Providers: https://pope.com/ This test is not yet approved or cleared by the [place] FDA and has been authorized for detection and/or diagnosis of SARS-CoV-2 by FDA under an Emergency Use Authorization (EUA).  This EUA will remain in effect (meaning this test can be used) for the duration of the COVID-19 declaration under Section 564(b)(1) of the Act, 21 U.S.C. section 360bbb-3(b)(1), unless the authorization is terminated or revoked sooner. Performed at [facility], [address], [place], [place] [address]   Culture, blood (Routine X 2) w Reflex to ID Panel     Status: None (Preliminary result)   Collection Time: 10/11/18  5:30 AM  Result Value Ref Range Status   Specimen Description BLOOD LEFT HAND  Final   Special Requests   Final    BOTTLES DRAWN AEROBIC AND ANAEROBIC Blood Culture adequate volume   Culture   Final    NO GROWTH 4 DAYS Performed at [facility], [address], [place], [place] [address]    Report Status PENDING  Incomplete  Culture, blood (Routine X 2) w Reflex to ID Panel     Status: None (Preliminary result)   Collection Time: 10/11/18  5:48 AM  Result Value Ref Range Status   Specimen Description BLOOD RIGHT HAND  Final   Special Requests   Final    BOTTLES DRAWN AEROBIC ONLY Blood Culture results may not be optimal due to an inadequate volume of blood received in culture bottles   Culture   Final    NO GROWTH 4 DAYS Performed at  [facility], [address] [place], [place] [address]    Report Status PENDING  Incomplete         Radiology Studies: No results found.      Scheduled Meds: . sodium chloride   Intravenous Once  . enoxaparin (LOVENOX) injection  40 mg Subcutaneous Q12H  . insulin aspart  0-15 Units Subcutaneous Q4H  . insulin aspart  5 Units Subcutaneous TID WC  . insulin glargine  15 Units Subcutaneous Daily  . insulin glargine  5 Units  Subcutaneous QHS  . methylPREDNISolone (SOLU-MEDROL) injection  30 mg Intravenous Daily  . sodium chloride flush  3 mL Intravenous Q12H   Continuous Infusions: . sodium chloride Stopped (10/15/18 1446)  . remdesivir 100 mg in NS 250 mL       LOS: 4 days    Huey Bienenstockawood Tayla Panozzo, MD [facility] 10/15/2018, 4:04 PM     Please page through AMION:  [email or web address] Password [email or web address] If 7PM-7AM, please contact night-coverage

## 2018-10-15 NOTE — Progress Notes (Signed)
Yuda Cupo (lives with patient) called and updated on patient's status per patient request. All questions answered.

## 2018-10-16 ENCOUNTER — Inpatient Hospital Stay (HOSPITAL_COMMUNITY): Payer: BC Managed Care – PPO

## 2018-10-16 DIAGNOSIS — J988 Other specified respiratory disorders: Secondary | ICD-10-CM

## 2018-10-16 DIAGNOSIS — R0600 Dyspnea, unspecified: Secondary | ICD-10-CM

## 2018-10-16 DIAGNOSIS — U071 COVID-19: Secondary | ICD-10-CM

## 2018-10-16 LAB — CBC WITH DIFFERENTIAL/PLATELET
Abs Immature Granulocytes: 0.08 10*3/uL — ABNORMAL HIGH (ref 0.00–0.07)
Basophils Absolute: 0 10*3/uL (ref 0.0–0.1)
Basophils Relative: 0 %
Eosinophils Absolute: 0 10*3/uL (ref 0.0–0.5)
Eosinophils Relative: 0 %
HCT: 45.9 % (ref 39.0–52.0)
Hemoglobin: 14.8 g/dL (ref 13.0–17.0)
Immature Granulocytes: 1 %
Lymphocytes Relative: 11 %
Lymphs Abs: 1.1 10*3/uL (ref 0.7–4.0)
MCH: 29.2 pg (ref 26.0–34.0)
MCHC: 32.2 g/dL (ref 30.0–36.0)
MCV: 90.7 fL (ref 80.0–100.0)
Monocytes Absolute: 0.8 10*3/uL (ref 0.1–1.0)
Monocytes Relative: 8 %
Neutro Abs: 8 10*3/uL — ABNORMAL HIGH (ref 1.7–7.7)
Neutrophils Relative %: 80 %
Platelets: 332 10*3/uL (ref 150–400)
RBC: 5.06 MIL/uL (ref 4.22–5.81)
RDW: 13.5 % (ref 11.5–15.5)
WBC: 10 10*3/uL (ref 4.0–10.5)
nRBC: 0 % (ref 0.0–0.2)

## 2018-10-16 LAB — COMPREHENSIVE METABOLIC PANEL
ALT: 37 U/L (ref 0–44)
AST: 44 U/L — ABNORMAL HIGH (ref 15–41)
Albumin: 3.5 g/dL (ref 3.5–5.0)
Alkaline Phosphatase: 70 U/L (ref 38–126)
Anion gap: 10 (ref 5–15)
BUN: 35 mg/dL — ABNORMAL HIGH (ref 8–23)
CO2: 29 mmol/L (ref 22–32)
Calcium: 8.9 mg/dL (ref 8.9–10.3)
Chloride: 98 mmol/L (ref 98–111)
Creatinine, Ser: 0.74 mg/dL (ref 0.61–1.24)
GFR calc Af Amer: >60 mL/min (ref >60)
GFR calc non Af Amer: >60 mL/min (ref >60)
Glucose, Bld: 141 mg/dL — ABNORMAL HIGH (ref 70–99)
Potassium: 4 mmol/L (ref 3.5–5.1)
Sodium: 137 mmol/L (ref 135–145)
Total Bilirubin: 0.8 mg/dL (ref 0.3–1.2)
Total Protein: 7.2 g/dL (ref 6.5–8.1)

## 2018-10-16 LAB — CULTURE, BLOOD (ROUTINE X 2)
Culture: NO GROWTH
Culture: NO GROWTH
Special Requests: ADEQUATE

## 2018-10-16 LAB — GLUCOSE, CAPILLARY
Glucose-Capillary: 125 mg/dL — ABNORMAL HIGH (ref 70–99)
Glucose-Capillary: 138 mg/dL — ABNORMAL HIGH (ref 70–99)
Glucose-Capillary: 180 mg/dL — ABNORMAL HIGH (ref 70–99)
Glucose-Capillary: 183 mg/dL — ABNORMAL HIGH (ref 70–99)
Glucose-Capillary: 207 mg/dL — ABNORMAL HIGH (ref 70–99)
Glucose-Capillary: 375 mg/dL — ABNORMAL HIGH (ref 70–99)

## 2018-10-16 LAB — FERRITIN: Ferritin: 258 ng/mL (ref 24–336)

## 2018-10-16 LAB — C-REACTIVE PROTEIN: CRP: 4.5 mg/dL — ABNORMAL HIGH (ref <1.0)

## 2018-10-16 LAB — D-DIMER, QUANTITATIVE: D-Dimer, Quant: 0.45 ug/mL-FEU (ref 0.00–0.50)

## 2018-10-16 MED ORDER — ORAL CARE MOUTH RINSE
15.0000 mL | Freq: Two times a day (BID) | OROMUCOSAL | Status: DC
  Administered 2018-10-16 – 2018-11-07 (×36): 15 mL via OROMUCOSAL

## 2018-10-16 MED ORDER — METHYLPREDNISOLONE SODIUM SUCC 125 MG IJ SOLR
INTRAMUSCULAR | Status: AC
  Filled 2018-10-16: qty 2

## 2018-10-16 MED ORDER — INSULIN ASPART 100 UNIT/ML ~~LOC~~ SOLN
0.0000 [IU] | Freq: Every day | SUBCUTANEOUS | Status: DC
  Administered 2018-10-17: 21:00:00 3 [IU] via SUBCUTANEOUS
  Administered 2018-10-18: 2 [IU] via SUBCUTANEOUS
  Administered 2018-10-25: 4 [IU] via SUBCUTANEOUS
  Administered 2018-11-04: 2 [IU] via SUBCUTANEOUS

## 2018-10-16 MED ORDER — CHLORHEXIDINE GLUCONATE 0.12 % MT SOLN
15.0000 mL | Freq: Two times a day (BID) | OROMUCOSAL | Status: DC
  Administered 2018-10-16 – 2018-11-07 (×39): 15 mL via OROMUCOSAL
  Filled 2018-10-16 (×39): qty 15

## 2018-10-16 MED ORDER — INSULIN ASPART 100 UNIT/ML ~~LOC~~ SOLN
0.0000 [IU] | Freq: Three times a day (TID) | SUBCUTANEOUS | Status: DC
  Administered 2018-10-17: 5 [IU] via SUBCUTANEOUS
  Administered 2018-10-17: 11 [IU] via SUBCUTANEOUS
  Administered 2018-10-18: 13:00:00 5 [IU] via SUBCUTANEOUS
  Administered 2018-10-18: 08:00:00 2 [IU] via SUBCUTANEOUS
  Administered 2018-10-18: 8 [IU] via SUBCUTANEOUS
  Administered 2018-10-19: 08:00:00 2 [IU] via SUBCUTANEOUS
  Administered 2018-10-19: 11 [IU] via SUBCUTANEOUS
  Administered 2018-10-19: 13:00:00 15 [IU] via SUBCUTANEOUS
  Administered 2018-10-20: 2 [IU] via SUBCUTANEOUS
  Administered 2018-10-20: 8 [IU] via SUBCUTANEOUS
  Administered 2018-10-20: 2 [IU] via SUBCUTANEOUS
  Administered 2018-10-21 – 2018-10-22 (×3): 3 [IU] via SUBCUTANEOUS
  Administered 2018-10-23: 11 [IU] via SUBCUTANEOUS
  Administered 2018-10-24: 3 [IU] via SUBCUTANEOUS
  Administered 2018-10-24: 2 [IU] via SUBCUTANEOUS
  Administered 2018-10-25 (×2): 5 [IU] via SUBCUTANEOUS
  Administered 2018-10-25: 2 [IU] via SUBCUTANEOUS
  Administered 2018-10-26: 11 [IU] via SUBCUTANEOUS
  Administered 2018-10-26: 3 [IU] via SUBCUTANEOUS
  Administered 2018-10-27: 2 [IU] via SUBCUTANEOUS
  Administered 2018-10-27 – 2018-10-28 (×3): 3 [IU] via SUBCUTANEOUS
  Administered 2018-10-28: 2 [IU] via SUBCUTANEOUS
  Administered 2018-10-28: 5 [IU] via SUBCUTANEOUS
  Administered 2018-10-29: 3 [IU] via SUBCUTANEOUS
  Administered 2018-10-29: 15 [IU] via SUBCUTANEOUS
  Administered 2018-10-30 (×2): 5 [IU] via SUBCUTANEOUS
  Administered 2018-10-30: 3 [IU] via SUBCUTANEOUS
  Administered 2018-10-31: 8 [IU] via SUBCUTANEOUS
  Administered 2018-10-31: 15 [IU] via SUBCUTANEOUS
  Administered 2018-11-01 – 2018-11-02 (×2): 3 [IU] via SUBCUTANEOUS
  Administered 2018-11-02: 5 [IU] via SUBCUTANEOUS
  Administered 2018-11-02 – 2018-11-03 (×2): 2 [IU] via SUBCUTANEOUS
  Administered 2018-11-03: 8 [IU] via SUBCUTANEOUS
  Administered 2018-11-04: 2 [IU] via SUBCUTANEOUS
  Administered 2018-11-04: 8 [IU] via SUBCUTANEOUS
  Administered 2018-11-04: 2 [IU] via SUBCUTANEOUS
  Administered 2018-11-05: 8 [IU] via SUBCUTANEOUS
  Administered 2018-11-05: 2 [IU] via SUBCUTANEOUS
  Administered 2018-11-05: 5 [IU] via SUBCUTANEOUS

## 2018-10-16 NOTE — Progress Notes (Signed)
PROGRESS NOTE  Austin Page ZOX:[id number] DOB: Sep 14, 1950 DOA: 09/23/2018 PCP: Patient, [name]   LOS: 5 days   Brief Narrative / Interim history: 68 year old male with no significant past medical history came into the hospital and was admitted on 09/24/2018 with 3 days of fever, chills, cough, vomiting and weakness in the setting of having a normal Covid contact.  In the ER he was febrile, tachycardic, tachypneic, underwent a CT angiogram which showed peripheral groundglass opacities.  He was Covid positive himself.  He had rapid deterioration of respiratory status requiring 15 L high flow nasal cannula and was transferred to the ICU on 5/25  Subjective: Charlie he feels well, denies any significant shortness of breath, no chest pain, no nausea or vomiting.  Assessment & Plan: Principal Problem:   Acute respiratory disease due to COVID-19 virus Active Problems:   Hyperglycemia   Hyponatremia   Principal Problem Acute Hypoxic Respiratory Failure due to Covid-19 Viral Illness -Patient had a rapid progression of respiratory failure, initially on 2 L nasal cannula and has progressed to 15 L on therapy on 5/25 requiring transfer to the ICU.  He continues to remain on high oxygen requirements, combined nonrebreather and 15 L high flow.  Continue to monitor in ICU -Continue to remain high risk for intubation -Has received Lasix daily 5/25, 26, 27.  He appears euvolemic this morning, chest x-ray without significant fluid overload, urine output 1.8 L over the last 24 hours, hold further Lasix and monitor renal function and total fluid balance -Continue to encourage pruning  Treatment so far -Received Actemra x2 on 5/24 and 5/25 -Received convalescent plasma on 5/25  -continue Solu-Medrol for now -Remdesivir started on 5/24  The treatment plan and use of medications and known side effects were discussed with patient/family, they were clearly explained that there is no proven definitive  treatment for COVID-19 infection, any medications used here are based on published clinical articles/anecdotal data which are not peer-reviewed or randomized control trials.  Complete risks and long-term side effects are unknown, however in the best clinical judgment they seem to be of some clinical benefit rather than medical risks.  Patient agree with the treatment plan and want to receive the given medications.  COVID-19 Labs -Improving as below  Recent Labs    10/14/18 0215 10/15/18 0200 10/16/18 0545  DDIMER 0.74* 0.55* 0.45  FERRITIN 339* 264 258  CRP 14.9* 8.4* 4.5*    Lab Results  Component Value Date   SARSCOV2NAA POSITIVE (A) 09/23/2018    Active Problems New onset diabetes -Hemoglobin A1c 10.7, currently on Lantus 20 units in the morning and 5 in the evening, sliding scale plus NovoLog 8 units scheduled, CBGs are fairly well-controlled 120s to 140s and will continue to keep on current regimen  Hyponatremia -Resolved  Hypokalemia -Repleted, monitor   Scheduled Meds: . sodium chloride   Intravenous Once  . enoxaparin (LOVENOX) injection  40 mg Subcutaneous Q12H  . insulin aspart  0-15 Units Subcutaneous Q4H  . insulin aspart  8 Units Subcutaneous TID WC  . insulin glargine  20 Units Subcutaneous Daily  . insulin glargine  5 Units Subcutaneous QHS  . methylPREDNISolone (SOLU-MEDROL) injection  30 mg Intravenous Daily  . sodium chloride flush  3 mL Intravenous Q12H   Continuous Infusions: . sodium chloride Stopped (10/15/18 1746)  . remdesivir 100 mg in NS 250 mL     PRN Meds:.sodium chloride, acetaminophen, HYDROcodone-acetaminophen, ondansetron **OR** ondansetron (ZOFRAN) IV, senna-docusate, sodium chloride flush  DVT prophylaxis: Lovenox Code Status: Full code Family Communication: Discussed with daughter Faizan Geraci [phone] Disposition Plan: Remains in the ICU  Consultants:   Critical care  Procedures:   None   Antimicrobials:  Remdesivir  5/24 >>   Objective: Vitals:   10/16/18 0555 10/16/18 0600 10/16/18 0700 10/16/18 0800  BP:  130/65 101/63 130/75  Pulse: 96 95 91 (!) 108  Resp: (!) 8 (!) 23 10 (!) 42  Temp:    98.1 F (36.7 C)  TempSrc:    Oral  SpO2: (!) 78% 90% 93% (!) 87%  Weight:      Height:        Intake/Output Summary (Last 24 hours) at 10/16/2018 1023 Last data filed at 10/16/2018 0800 Gross per 24 hour  Intake 503.33 ml  Output 1700 ml  Net -1196.67 ml   Filed Weights   10/12/2018 2035  Weight: 72.6 kg    Examination:  Constitutional: NAD Eyes: lids and conjunctivae normal ENMT: Mucous membranes are moist.  Respiratory: clear to auscultation bilaterally, no wheezing, no crackles. Normal respiratory effort.  Cardiovascular: Regular rate and rhythm, no murmurs / rubs / gallops. No LE edema.  Abdomen: no tenderness. Bowel sounds positive.  Musculoskeletal: no clubbing / cyanosis Skin: no rashes Neurologic: CN 2-12 grossly intact. Strength 5/5 in all 4.  Psychiatric: Normal judgment and insight. Alert and oriented x 3. Normal mood.    Data Reviewed: I have independently reviewed following labs and imaging studies   CBC: Recent Labs  Lab 10/12/18 0918 10/13/18 0500 10/14/18 0215 10/15/18 0200 10/16/18 0545  WBC 12.3* 11.6* 9.2 10.8* 10.0  NEUTROABS 10.0* 8.2* 8.0* 9.0* 8.0*  HGB 12.3* 13.7 13.6 13.9 14.8  HCT 38.0* 41.4 41.0 41.7 45.9  MCV 91.8 90.2 88.7 88.3 90.7  PLT 198 222 247 294 332   Basic Metabolic Panel: Recent Labs  Lab 10/11/18 2002 10/12/18 0918 10/13/18 0500 10/14/18 0215 10/15/18 0200 10/16/18 0545  NA 129* 132* 136 137 137 137  K 4.0 3.5 3.2* 3.7 3.9 4.0  CL 98 101 103 103 101 98  CO2 GLUCOSE 375* 209* 180* 240* 190* 141*  BUN 28* 33* 35*  CREATININE 0.83 0.75 0.65 0.76 0.66 0.74  CALCIUM 8.5* 8.0* 8.7* 8.6* 8.8* 8.9  MG 1.8  --   --  2.1  --   --    GFR: Estimated Creatinine Clearance: 83.8 mL/min (by C-G formula based on SCr  of 0.74 mg/dL). Liver Function Tests: Recent Labs  Lab 10/12/18 0918 10/13/18 0500 10/14/18 0215 10/15/18 0200 10/16/18 0545  AST 29 35 34 36 44*  ALT 37  ALKPHOS 49 60 61 65 70  BILITOT 0.7 0.8 0.3 0.3 0.8  PROT 6.4* 6.9 7.0 6.9 7.2  ALBUMIN 3.0* 3.0* 3.0* 3.1* 3.5   No results for input(s): LIPASE, AMYLASE in the last 168 hours. No results for input(s): AMMONIA in the last 168 hours. Coagulation Profile: No results for input(s): INR, PROTIME in the last 168 hours. Cardiac Enzymes: Recent Labs  Lab 10/12/18 0918 10/13/18 0500 10/14/18 0215  CKTOTAL 330 256 117   BNP (last 3 results) No results for input(s): PROBNP in the last 8760 hours. HbA1C: No results for input(s): HGBA1C in the last 72 hours. CBG: Recent Labs  Lab 10/15/18 1543 10/15/18 2032 10/16/18 0023 10/16/18 0444 10/16/18 0816  GLUCAP 250* 305* 180* 138* 125*   Lipid Profile: No results for  input(s): CHOL, HDL, LDLCALC, TRIG, CHOLHDL, LDLDIRECT in the last 72 hours. Thyroid Function Tests: No results for input(s): TSH, T4TOTAL, FREET4, T3FREE, THYROIDAB in the last 72 hours. Anemia Panel: Recent Labs    10/15/18 0200 10/16/18 0545  FERRITIN 264 258   Urine analysis: No results found for: COLORURINE, APPEARANCEUR, LABSPEC, PHURINE, GLUCOSEU, HGBUR, BILIRUBINUR, KETONESUR, PROTEINUR, UROBILINOGEN, NITRITE, LEUKOCYTESUR Sepsis Labs: Invalid input(s): PROCALCITONIN, LACTICIDVEN  Recent Results (from the past 240 hour(s))  SARS Coronavirus 2 (CEPHEID- Performed in [facility] hospital lab), Hosp Order     Status: Abnormal   Collection Time: 10/15/2018  8:44 PM  Result Value Ref Range Status   SARS Coronavirus 2 POSITIVE (A) NEGATIVE Final    Comment: RESULT CALLED TO, READ BACK BY AND VERIFIED WITH: L CHILTON RN 09/27/2018 2232 JDW (NOTE) If result is NEGATIVE SARS-CoV-2 target nucleic acids are NOT DETECTED. The SARS-CoV-2 RNA is generally detectable in upper and lower  respiratory  specimens during the acute phase of infection. The lowest  concentration of SARS-CoV-2 viral copies this assay can detect is 250  copies / mL. A negative result does not preclude SARS-CoV-2 infection  and should not be used as the sole basis for treatment or other  patient management decisions.  A negative result may occur with  improper specimen collection / handling, submission of specimen other  than nasopharyngeal swab, presence of viral mutation(s) within the  areas targeted by this assay, and inadequate number of viral copies  (<250 copies / mL). A negative result must be combined with clinical  observations, patient history, and epidemiological information. If result is POSITIVE SARS-CoV-2 target nucleic acids are DETECTED. The SAR S-CoV-2 RNA is generally detectable in upper and lower  respiratory specimens during the acute phase of infection.  Positive  results are indicative of active infection with SARS-CoV-2.  Clinical  correlation with patient history and other diagnostic information is  necessary to determine patient infection status.  Positive results do  not rule out bacterial infection or co-infection with other viruses. If result is PRESUMPTIVE POSTIVE SARS-CoV-2 nucleic acids MAY BE PRESENT.   A presumptive positive result was obtained on the submitted specimen  and confirmed on repeat testing.  While 2019 novel coronavirus  (SARS-CoV-2) nucleic acids may be present in the submitted sample  additional confirmatory testing may be necessary for epidemiological  and / or clinical management purposes  to differentiate between  SARS-CoV-2 and other Sarbecovirus currently known to infect humans.  If clinically indicated additional testing with an alternate test  methodology [phone]) is advis ed. The SARS-CoV-2 RNA is generally  detectable in upper and lower respiratory specimens during the acute  phase of infection. The expected result is Negative. Fact Sheet for  Patients:  BoilerBrush.com.cy Fact Sheet for Healthcare Providers: https://pope.com/ This test is not yet approved or cleared by the [place] FDA and has been authorized for detection and/or diagnosis of SARS-CoV-2 by FDA under an Emergency Use Authorization (EUA).  This EUA will remain in effect (meaning this test can be used) for the duration of the COVID-19 declaration under Section 564(b)(1) of the Act, 21 U.S.C. section 360bbb-3(b)(1), unless the authorization is terminated or revoked sooner. Performed at [facility], [address], [place], [place] [address]   Culture, blood (Routine X 2) w Reflex to ID Panel     Status: None (Preliminary result)   Collection Time: 10/11/18  5:30 AM  Result Value Ref Range Status   Specimen Description BLOOD LEFT  HAND  Final   Special Requests   Final    BOTTLES DRAWN AEROBIC AND ANAEROBIC Blood Culture adequate volume   Culture   Final    NO GROWTH 4 DAYS Performed at [facility], [address] [place], [place] [address]    Report Status PENDING  Incomplete  Culture, blood (Routine X 2) w Reflex to ID Panel     Status: None (Preliminary result)   Collection Time: 10/11/18  5:48 AM  Result Value Ref Range Status   Specimen Description BLOOD RIGHT HAND  Final   Special Requests   Final    BOTTLES DRAWN AEROBIC ONLY Blood Culture results may not be optimal due to an inadequate volume of blood received in culture bottles   Culture   Final    NO GROWTH 4 DAYS Performed at [facility], [address] [place], [place] [address]    Report Status PENDING  Incomplete      Radiology Studies: Dg Chest Port 1 View  Result Date: 10/16/2018 CLINICAL DATA:  Dyspnea.COVID19 EXAM: PORTABLE CHEST 1 VIEW COMPARISON:  10/12/2018 FINDINGS: Heart size appears normal. No pleural effusion identified. Diffuse hazy opacities are noted in the left lung. Airspace consolidation within the  right upper lobe and left base is improved from previous exam. IMPRESSION: 1. Persistent left lung hazy opacities compatible with inflammation/infection. 2. Improving consolidation within the right upper lobe and left base. Electronically Signed   By: Signa Kellaylor  [place]oud M.D.   On: 10/16/2018 09:15    Pamella Pertostin , MD, PhD [facility]  Contact via  [email or web address]  TR[facility] Office Info P: [phone]  F: [phone]

## 2018-10-16 NOTE — Progress Notes (Signed)
NAME:  Austin Page, MRN:  [id number], DOB:  17-Jul-1950, LOS: 5 ADMISSION DATE:  10/06/2018, CONSULTATION DATE:  10/12/2017 REFERRING MD:  Maryfrances Bunnellanford, CHIEF COMPLAINT:  Dyspnea   Brief History   68 year old male admitted on May 22 for hypoxemic respiratory failure secondary to COVID-19 pneumonia.  Transferred to the intensive care unit early a.m. May 25 for worsening hypoxemia.  Past Medical History  Possible DM2  Significant Hospital Events   May 22 admission May 25 moved to ICU  Consults:  Pulmonary  Procedures:    Significant Diagnostic Tests:  Oct 13, 2018 CT angiogram chest showed no pulmonary embolism, consolidation right upper lobe  Micro Data:  5/22 SARS-COV2 > positive May 23 blood culture  Antimicrobials/COVID treatment  5/24 Actemra >  5/24 remdesivir >    Interim history/subjective:   WOB improving, saturation improving  Objective   Blood pressure 130/75, pulse (!) 108, temperature 98.1 F (36.7 C), temperature source Oral, resp. rate (!) 42, height 5\' 7"  (1.702 m), weight 72.6 kg, SpO2 (!) 88 %.    FiO2 (%):  [100 %] 100 %   Intake/Output Summary (Last 24 hours) at 10/16/2018 1335 Last data filed at 10/16/2018 0800 Gross per 24 hour  Intake 503.33 ml  Output 1600 ml  Net -1096.67 ml   Filed Weights   10/08/2018 2035  Weight: 72.6 kg   Examination:  General:  Resting comfortably in exam beds HENT: [place]/AT, PERRL, EOM-I and MMM PULM: Diffuse crackles CV: RRR, Nl S1/S2 and -M/R/G GI: Soft, NT, ND and +BS MSK: normal bulk and tone Neuro: awake, alert, no distress, MAEW  I reviewed CXR myself, infiltrate noted  Resolved Hospital Problem list     Assessment & Plan:  COVID-19 pneumonia causing acute respiratory failure with hypoxemia: Hold in the ICU for close monitoring HFNC at 100% Target O2 saturation > 85% PT consult Awake prone positioning Low dose methylprednisolone  Diabetes mellitus type 2: Hemoglobin A1c 10.7 on admission  SSI  Best practice:  Diet: as tolerated Pain/Anxiety/Delirium protocol (if indicated): n/a VAP protocol (if indicated): n/a DVT prophylaxis: lovenox bid GI prophylaxis: n/a Glucose control: SSI Mobility: monitor Code Status: Full [facility]: remain in [facility]  Labs   CBC: Recent Labs  Lab 10/12/18 0918 10/13/18 0500 10/14/18 0215 10/15/18 0200 10/16/18 0545  WBC 12.3* 11.6* 9.2 10.8* 10.0  NEUTROABS 10.0* 8.2* 8.0* 9.0* 8.0*  HGB 12.3* 13.7 13.6 13.9 14.8  HCT 38.0* 41.4 41.0 41.7 45.9  MCV 91.8 90.2 88.7 88.3 90.7  PLT 198 222 247 294 332   Basic Metabolic Panel: Recent Labs  Lab 10/11/18 2002 10/12/18 0918 10/13/18 0500 10/14/18 0215 10/15/18 0200 10/16/18 0545  NA 129* 132* 136 137 137 137  K 4.0 3.5 3.2* 3.7 3.9 4.0  CL 98 101 103 103 101 98  CO2 22 22 23 24 27 29   GLUCOSE 375* 209* 180* 240* 190* 141*  BUN 18 19 20  28* 33* 35*  CREATININE 0.83 0.75 0.65 0.76 0.66 0.74  CALCIUM 8.5* 8.0* 8.7* 8.6* 8.8* 8.9  MG 1.8  --   --  2.1  --   --    GFR: Estimated Creatinine Clearance: 83.8 mL/min (by C-G formula based on SCr of 0.74 mg/dL). Recent Labs  Lab 10/14/2018 2057 10/11/18 0548  10/13/18 0500 10/14/18 0215 10/15/18 0200 10/16/18 0545  PROCALCITON  --  0.12  --   --   --   --   --   WBC  --   --    < >  11.6* 9.2 10.8* 10.0  LATICACIDVEN 1.2  --   --   --   --   --   --    < > = values in this interval not displayed.    Liver Function Tests: Recent Labs  Lab 10/12/18 0918 10/13/18 0500 10/14/18 0215 10/15/18 0200 10/16/18 0545  AST 29 35 34 36 44*  ALT 18 20 25 27  37  ALKPHOS 49 60 61 65 70  BILITOT 0.7 0.8 0.3 0.3 0.8  PROT 6.4* 6.9 7.0 6.9 7.2  ALBUMIN 3.0* 3.0* 3.0* 3.1* 3.5   No results for input(s): LIPASE, AMYLASE in the last 168 hours. No results for input(s): AMMONIA in the last 168 hours.  ABG No results found for: PHART, PCO2ART, PO2ART, HCO3, TCO2, ACIDBASEDEF, O2SAT   Coagulation Profile: No  results for input(s): INR, PROTIME in the last 168 hours.  Cardiac Enzymes: Recent Labs  Lab 10/12/18 0918 10/13/18 0500 10/14/18 0215  CKTOTAL 330 256 117    HbA1C: Hgb A1c MFr Bld  Date/Time Value Ref Range Status  10/11/2018 05:29 AM 10.7 (H) 4.8 - 5.6 % Final    Comment:    (NOTE) Pre diabetes:          5.7%-6.4% Diabetes:              >6.4% Glycemic control for   <7.0% adults with diabetes     CBG: Recent Labs  Lab 10/15/18 2032 10/16/18 0023 10/16/18 0444 10/16/18 0816 10/16/18 1140  GLUCAP 305* 180* 138* 125* 207*   Discussed with [facility]-MD  Alyson Reedy, M.D. [facility]. Pager: [phone]. After hours pager: [phone].

## 2018-10-16 NOTE — Progress Notes (Signed)
Called pt daughter with patient to discuss plan of care for the evening and discuss and questions or concerns. Pt daughter states that we do not need the uinterpreter service, and that her father is more comfortable using her as the itnterpreter. Pt english is good enough to communicate basic needs and questions. No further questions or concerns at this time. Got pt up oob to recliner without mobility difficulty. Steady on his feet. SPO2 dropped to68-70% on monitor. Pt required additional O2 supplementation of nonrebreather for recovery. Tried to remove after SPO2 recovered, but pt dropped back down to 85% on monitor. Will continue to wean off nonrebreather as patient recovers. Pt looks comfortable and is watching jeopardy on his iPad.

## 2018-10-16 NOTE — Progress Notes (Signed)
Update called to daughter. Dr. Wyonia Hough also states he will call with update.

## 2018-10-16 NOTE — Progress Notes (Signed)
Proned patient back in bed. Pt has apnea and snores.

## 2018-10-16 NOTE — Progress Notes (Signed)
Pt still requiring nonrebreather with HFNC. Helped pt back to bed into semiprone position on left side. SPO2 recovering very well. Pt tolerating.

## 2018-10-17 DIAGNOSIS — U071 COVID-19: Secondary | ICD-10-CM

## 2018-10-17 LAB — C-REACTIVE PROTEIN: CRP: 2.7 mg/dL — ABNORMAL HIGH (ref <1.0)

## 2018-10-17 LAB — CBC
HCT: 45.9 % (ref 39.0–52.0)
Hemoglobin: 15.2 g/dL (ref 13.0–17.0)
MCH: 30.3 pg (ref 26.0–34.0)
MCHC: 33.1 g/dL (ref 30.0–36.0)
MCV: 91.4 fL (ref 80.0–100.0)
Platelets: 309 10*3/uL (ref 150–400)
RBC: 5.02 MIL/uL (ref 4.22–5.81)
RDW: 13.5 % (ref 11.5–15.5)
WBC: 11.3 10*3/uL — ABNORMAL HIGH (ref 4.0–10.5)
nRBC: 0 % (ref 0.0–0.2)

## 2018-10-17 LAB — FERRITIN: Ferritin: 235 ng/mL (ref 24–336)

## 2018-10-17 LAB — GLUCOSE, CAPILLARY
Glucose-Capillary: 103 mg/dL — ABNORMAL HIGH (ref 70–99)
Glucose-Capillary: 373 mg/dL — ABNORMAL HIGH (ref 70–99)
Glucose-Capillary: 341 mg/dL — ABNORMAL HIGH (ref 70–99)
Glucose-Capillary: 225 mg/dL — ABNORMAL HIGH (ref 70–99)
Glucose-Capillary: 273 mg/dL — ABNORMAL HIGH (ref 70–99)

## 2018-10-17 LAB — COMPREHENSIVE METABOLIC PANEL
ALT: 37 U/L (ref 0–44)
AST: 37 U/L (ref 15–41)
Albumin: 3.3 g/dL — ABNORMAL LOW (ref 3.5–5.0)
Alkaline Phosphatase: 77 U/L (ref 38–126)
Anion gap: 11 (ref 5–15)
BUN: 29 mg/dL — ABNORMAL HIGH (ref 8–23)
CO2: 27 mmol/L (ref 22–32)
Calcium: 9 mg/dL (ref 8.9–10.3)
Chloride: 97 mmol/L — ABNORMAL LOW (ref 98–111)
Creatinine, Ser: 0.71 mg/dL (ref 0.61–1.24)
GFR calc Af Amer: >60 mL/min (ref >60)
GFR calc non Af Amer: >60 mL/min (ref >60)
Glucose, Bld: 139 mg/dL — ABNORMAL HIGH (ref 70–99)
Potassium: 3.9 mmol/L (ref 3.5–5.1)
Sodium: 135 mmol/L (ref 135–145)
Total Bilirubin: 0.5 mg/dL (ref 0.3–1.2)
Total Protein: 6.8 g/dL (ref 6.5–8.1)

## 2018-10-17 LAB — MAGNESIUM: Magnesium: 2.2 mg/dL (ref 1.7–2.4)

## 2018-10-17 LAB — D-DIMER, QUANTITATIVE: D-Dimer, Quant: 1.39 ug/mL-FEU — ABNORMAL HIGH (ref 0.00–0.50)

## 2018-10-17 LAB — PHOSPHORUS: Phosphorus: 3.9 mg/dL (ref 2.5–4.6)

## 2018-10-17 MED ORDER — FUROSEMIDE 10 MG/ML IJ SOLN
40.0000 mg | Freq: Two times a day (BID) | INTRAMUSCULAR | Status: AC
  Administered 2018-10-17 (×2): 40 mg via INTRAVENOUS
  Filled 2018-10-17 (×2): qty 4

## 2018-10-17 MED ORDER — INSULIN ASPART 100 UNIT/ML ~~LOC~~ SOLN
10.0000 [IU] | Freq: Three times a day (TID) | SUBCUTANEOUS | Status: DC
  Administered 2018-10-17 – 2018-10-23 (×18): 10 [IU] via SUBCUTANEOUS

## 2018-10-17 NOTE — Plan of Care (Signed)
  Problem: Education: Goal: Knowledge of General Education information will improve Description Including pain rating scale, medication(s)/side effects and non-pharmacologic comfort measures Outcome: Progressing   Problem: Health Behavior/Discharge Planning: Goal: Ability to manage health-related needs will improve Outcome: Progressing   Problem: Clinical Measurements: Goal: Ability to maintain clinical measurements within normal limits will improve Outcome: Progressing Goal: Respiratory complications will improve Outcome: Progressing   Problem: Coping: Goal: Level of anxiety will decrease Outcome: Adequate for Discharge   Problem: Elimination: Goal: Will not experience complications related to bowel motility Outcome: Adequate for Discharge Goal: Will not experience complications related to urinary retention Outcome: Adequate for Discharge   Problem: Pain Managment: Goal: General experience of comfort will improve Outcome: Completed/Met   Problem: Safety: Goal: Ability to remain free from injury will improve Outcome: Progressing   Problem: Skin Integrity: Goal: Risk for impaired skin integrity will decrease Outcome: Adequate for Discharge   Problem: Education: Goal: Knowledge of risk factors and measures for prevention of condition will improve Outcome: Progressing   Problem: Coping: Goal: Psychosocial and spiritual needs will be supported Outcome: Adequate for Discharge   Problem: Respiratory: Goal: Will maintain a patent airway Outcome: Progressing Goal: Complications related to the disease process, condition or treatment will be avoided or minimized Outcome: Progressing   Pt tolerated prone positioning well this AM for 2 hours. Currently up in chair after breakfast, utilizing iPAD. VSS, 40LPM HFNC 100% FiO2, SpO2 97 in chair.

## 2018-10-17 NOTE — Progress Notes (Signed)
PROGRESS NOTE  Austin Page ZOX:[id number] DOB: 1951-03-30 DOA: 30-Dec-2018 PCP: Patient, [name]   LOS: 6 days   Brief Narrative / Interim history: 68 year old male with no significant past medical history came into the hospital and was admitted on 30-Dec-2018 with 3 days of fever, chills, cough, vomiting and weakness in the setting of having a normal Covid contact.  In the ER he was febrile, tachycardic, tachypneic, underwent a CT angiogram which showed peripheral groundglass opacities.  He was Covid positive himself.  He had rapid deterioration of respiratory status requiring 15 L high flow nasal cannula and was transferred to the ICU on 5/25  Subjective: Proning this morning, states that his breathing feels improved.  Appears to tolerate.  Denies any chest pain, denies any nausea or vomiting.  Assessment & Plan: Principal Problem:   Acute respiratory disease due to COVID-19 virus Active Problems:   Hyperglycemia   Hyponatremia   COVID-19   Dyspnea   Principal Problem Acute Hypoxic Respiratory Failure due to Covid-19 Viral Illness -Patient had a rapid progression of respiratory failure, initially on 2 L nasal cannula and has progressed to 15 L on therapy on 5/25 requiring transfer to the ICU.  Remains on high flow nasal cannula, 100% FiO2 -Continues to be at risk for intubation -Has received Lasix daily 5/25, 26, 27, will repeat Lasix 9/28 -Monitor renal function, potassium, ins and outs -Continue to encourage proning  Treatment so far -Received Actemra x2 on 5/24 and 5/25 -Received convalescent plasma on 5/25  -continue Solu-Medrol for now -Remdesivir started on 5/24  The treatment plan and use of medications and known side effects were discussed with patient/family, they were clearly explained that there is no proven definitive treatment for COVID-19 infection, any medications used here are based on published clinical articles/anecdotal data which are not peer-reviewed or  randomized control trials.  Complete risks and long-term side effects are unknown, however in the best clinical judgment they seem to be of some clinical benefit rather than medical risks.  Patient agree with the treatment plan and want to receive the given medications.  COVID-19 Labs -Overall improving, d-dimer slightly elevated today, continue to monitor and continue Lovenox ICU dosing for DVT prophylaxis  Recent Labs    10/15/18 0200 10/16/18 0545 10/17/18 0145  DDIMER 0.55* 0.45 1.39*  FERRITIN 264 258 235  CRP 8.4* 4.5* 2.7*    Lab Results  Component Value Date   SARSCOV2NAA POSITIVE (A) 011-Aug-2020    Active Problems New onset diabetes -Hemoglobin A1c 10.7, currently on Lantus 20 units in the morning and 5 in the evening, sliding scale plus NovoLog 8 units scheduled, CBGs crept up yesterday into the 300s last night, will increase daytime meal coverage and keep long-acting as is  Hyponatremia -Resolved  Hypokalemia -Repleted, monitor   Scheduled Meds: . sodium chloride   Intravenous Once  . chlorhexidine  15 mL Mouth Rinse BID  . enoxaparin (LOVENOX) injection  40 mg Subcutaneous Q12H  . furosemide  40 mg Intravenous Q12H  . insulin aspart  0-15 Units Subcutaneous TID WC  . insulin aspart  0-5 Units Subcutaneous QHS  . insulin aspart  8 Units Subcutaneous TID WC  . insulin glargine  20 Units Subcutaneous Daily  . insulin glargine  5 Units Subcutaneous QHS  . mouth rinse  15 mL Mouth Rinse q12n4p  . methylPREDNISolone (SOLU-MEDROL) injection  30 mg Intravenous Daily  . sodium chloride flush  3 mL Intravenous Q12H   Continuous Infusions: .  sodium chloride Stopped (10/15/18 1746)   PRN Meds:.sodium chloride, acetaminophen, HYDROcodone-acetaminophen, ondansetron **OR** ondansetron (ZOFRAN) IV, senna-docusate, sodium chloride flush   DVT prophylaxis: Lovenox Code Status: Full code Family Communication: Discussed with daughter Austin Page [phone] Disposition  Plan: Remains in the ICU  Consultants:   Critical care  Procedures:   None   Antimicrobials:  Remdesivir 5/24 >>   Objective: Vitals:   10/17/18 0700 10/17/18 0743 10/17/18 0754 10/17/18 0800  BP: 117/69 117/69  129/72  Pulse: 93 96    Resp: 10 (!) 28    Temp: 97.8 F (36.6 C)     TempSrc: Oral     SpO2: 96% 94% 96%   Weight:      Height:        Intake/Output Summary (Last 24 hours) at 10/17/2018 0926 Last data filed at 10/17/2018 0100 Gross per 24 hour  Intake 1073 ml  Output 850 ml  Net 223 ml   Filed Weights   10/19/18 2035  Weight: 72.6 kg    Examination:  Constitutional: no distress, lying prone this morning Eyes: No scleral icterus ENMT: Moist mucous membranes Respiratory: Decreased at the bases, no wheezing, no crackles.  Normal respiratory effort Cardiovascular: Regular rate and rhythm, no murmurs appreciated.  No peripheral edema Abdomen: Soft, nontender, nondistended, bowel sounds positive Musculoskeletal: no clubbing / cyanosis Skin: No rashes seen Neurologic: No focal deficits, equal strength Psychiatric: Normal judgment and insight. Alert and oriented x 3. Normal mood.    Data Reviewed: I have independently reviewed following labs and imaging studies   CBC: Recent Labs  Lab 10/12/18 0918 10/13/18 0500 10/14/18 0215 10/15/18 0200 10/16/18 0545 10/17/18 0145  WBC 12.3* 11.6* 9.2 10.8* 10.0 11.3*  NEUTROABS 10.0* 8.2* 8.0* 9.0* 8.0*  --   HGB 12.3* 13.7 13.6 13.9 14.8 15.2  HCT 38.0* 41.4 41.0 41.7 45.9 45.9  MCV 91.8 90.2 88.7 88.3 90.7 91.4  PLT 198 222 247 294 332 309   Basic Metabolic Panel: Recent Labs  Lab 10/11/18 2002  10/13/18 0500 10/14/18 0215 10/15/18 0200 10/16/18 0545 10/17/18 0145  NA 129*   < > 136 137 137 137 135  K 4.0   < > 3.2* 3.7 3.9 4.0 3.9  CL 98   < > 103 103 101 98 97*  CO2 22   < > GLUCOSE 375*   < > 180* 240* 190* 141* 139*  BUN 18   < > 20 28* 33* 35* 29*  CREATININE 0.83   < >  0.65 0.76 0.66 0.74 0.71  CALCIUM 8.5*   < > 8.7* 8.6* 8.8* 8.9 9.0  MG 1.8  --   --  2.1  --   --  2.2  PHOS  --   --   --   --   --   --  3.9   < > = values in this interval not displayed.   GFR: Estimated Creatinine Clearance: 83.8 mL/min (by C-G formula based on SCr of 0.71 mg/dL). Liver Function Tests: Recent Labs  Lab 10/13/18 0500 10/14/18 0215 10/15/18 0200 10/16/18 0545 10/17/18 0145  AST 35 34 36 44* 37  ALT 37 37  ALKPHOS 60 61 65 70 77  BILITOT 0.8 0.3 0.3 0.8 0.5  PROT 6.9 7.0 6.9 7.2 6.8  ALBUMIN 3.0* 3.0* 3.1* 3.5 3.3*   No results for input(s): LIPASE, AMYLASE in the last 168 hours. No results for input(s): AMMONIA in the  last 168 hours. Coagulation Profile: No results for input(s): INR, PROTIME in the last 168 hours. Cardiac Enzymes: Recent Labs  Lab 10/12/18 0918 10/13/18 0500 10/14/18 0215  CKTOTAL 330 256 117   BNP (last 3 results) No results for input(s): PROBNP in the last 8760 hours. HbA1C: No results for input(s): HGBA1C in the last 72 hours. CBG: Recent Labs  Lab 10/16/18 0816 10/16/18 1140 10/16/18 1648 10/16/18 1957 10/17/18 0800  GLUCAP 125* 207* 183* 375* 103*   Lipid Profile: No results for input(s): CHOL, HDL, LDLCALC, TRIG, CHOLHDL, LDLDIRECT in the last 72 hours. Thyroid Function Tests: No results for input(s): TSH, T4TOTAL, FREET4, T3FREE, THYROIDAB in the last 72 hours. Anemia Panel: Recent Labs    10/16/18 0545 10/17/18 0145  FERRITIN 258 235   Urine analysis: No results found for: COLORURINE, APPEARANCEUR, LABSPEC, PHURINE, GLUCOSEU, HGBUR, BILIRUBINUR, KETONESUR, PROTEINUR, UROBILINOGEN, NITRITE, LEUKOCYTESUR Sepsis Labs: Invalid input(s): PROCALCITONIN, LACTICIDVEN  Recent Results (from the past 240 hour(s))  SARS Coronavirus 2 (CEPHEID- Performed in [facility] hospital lab), Hosp Order     Status: Abnormal   Collection Time: 10-29-18  8:44 PM  Result Value Ref Range Status   SARS Coronavirus 2  POSITIVE (A) NEGATIVE Final    Comment: RESULT CALLED TO, READ BACK BY AND VERIFIED WITH: L CHILTON RN 2018-10-29 2232 JDW (NOTE) If result is NEGATIVE SARS-CoV-2 target nucleic acids are NOT DETECTED. The SARS-CoV-2 RNA is generally detectable in upper and lower  respiratory specimens during the acute phase of infection. The lowest  concentration of SARS-CoV-2 viral copies this assay can detect is 250  copies / mL. A negative result does not preclude SARS-CoV-2 infection  and should not be used as the sole basis for treatment or other  patient management decisions.  A negative result may occur with  improper specimen collection / handling, submission of specimen other  than nasopharyngeal swab, presence of viral mutation(s) within the  areas targeted by this assay, and inadequate number of viral copies  (<250 copies / mL). A negative result must be combined with clinical  observations, patient history, and epidemiological information. If result is POSITIVE SARS-CoV-2 target nucleic acids are DETECTED. The SAR S-CoV-2 RNA is generally detectable in upper and lower  respiratory specimens during the acute phase of infection.  Positive  results are indicative of active infection with SARS-CoV-2.  Clinical  correlation with patient history and other diagnostic information is  necessary to determine patient infection status.  Positive results do  not rule out bacterial infection or co-infection with other viruses. If result is PRESUMPTIVE POSTIVE SARS-CoV-2 nucleic acids MAY BE PRESENT.   A presumptive positive result was obtained on the submitted specimen  and confirmed on repeat testing.  While 2019 novel coronavirus  (SARS-CoV-2) nucleic acids may be present in the submitted sample  additional confirmatory testing may be necessary for epidemiological  and / or clinical management purposes  to differentiate between  SARS-CoV-2 and other Sarbecovirus currently known to infect humans.  If  clinically indicated additional testing with an alternate test  methodology [phone]) is advis ed. The SARS-CoV-2 RNA is generally  detectable in upper and lower respiratory specimens during the acute  phase of infection. The expected result is Negative. Fact Sheet for Patients:  BoilerBrush.com.cy Fact Sheet for Healthcare Providers: https://pope.com/ This test is not yet approved or cleared by the [place] FDA and has been authorized for detection and/or diagnosis of SARS-CoV-2 by FDA under an Emergency Use Authorization (EUA).  This  EUA will remain in effect (meaning this test can be used) for the duration of the COVID-19 declaration under Section 564(b)(1) of the Act, 21 U.S.C. section 360bbb-3(b)(1), unless the authorization is terminated or revoked sooner. Performed at [facility], [address] [place], [place] [address]   Culture, blood (Routine X 2) w Reflex to ID Panel     Status: None   Collection Time: 10/11/18  5:30 AM  Result Value Ref Range Status   Specimen Description BLOOD LEFT HAND  Final   Special Requests   Final    BOTTLES DRAWN AEROBIC AND ANAEROBIC Blood Culture adequate volume   Culture   Final    NO GROWTH 5 DAYS Performed at [facility], [address], [place], [place] [address]    Report Status 10/16/2018 FINAL  Final  Culture, blood (Routine X 2) w Reflex to ID Panel     Status: None   Collection Time: 10/11/18  5:48 AM  Result Value Ref Range Status   Specimen Description BLOOD RIGHT HAND  Final   Special Requests   Final    BOTTLES DRAWN AEROBIC ONLY Blood Culture results may not be optimal due to an inadequate volume of blood received in culture bottles   Culture   Final    NO GROWTH 5 DAYS Performed at [facility], [address], [place], [place] [address]    Report Status 10/16/2018 FINAL  Final      Radiology Studies: Dg Chest Port 1 View  Result Date:  10/16/2018 CLINICAL DATA:  Dyspnea.COVID19 EXAM: PORTABLE CHEST 1 VIEW COMPARISON:  10/12/2018 FINDINGS: Heart size appears normal. No pleural effusion identified. Diffuse hazy opacities are noted in the left lung. Airspace consolidation within the right upper lobe and left base is improved from previous exam. IMPRESSION: 1. Persistent left lung hazy opacities compatible with inflammation/infection. 2. Improving consolidation within the right upper lobe and left base. Electronically Signed   By: Signa Kell M.D.   On: 10/16/2018 09:15    Pamella Pert, MD, PhD [facility]  Contact via  [email or web address]  [facility] Office Info P: [phone]  F: [phone]

## 2018-10-17 NOTE — Progress Notes (Signed)
NAME:  Austin Page, MRN:  [id number], DOB:  16-May-1951, LOS: 6 ADMISSION DATE:  06-Nov-2018, CONSULTATION DATE:  10/12/2017 REFERRING MD:  Maryfrances Bunnell, CHIEF COMPLAINT:  Dyspnea   Brief History   68 year old male admitted on May 22 for hypoxemic respiratory failure secondary to COVID-19 pneumonia.  Transferred to the intensive care unit early a.m. May 25 for worsening hypoxemia.  Past Medical History  Possible DM2  Significant Hospital Events   May 22 admission May 25 moved to ICU  Consults:  Pulmonary  Procedures:    Significant Diagnostic Tests:  Oct 13, 2018 CT angiogram chest showed no pulmonary embolism, consolidation right upper lobe  Micro Data:  5/22 SARS-COV2 > positive May 23 blood culture  Antimicrobials/COVID treatment  5/24 Actemra >  5/24 remdesivir >    Interim history/subjective:   Significant desaturation overnight, refusing prone positioning  Objective   Blood pressure 129/72, pulse 96, temperature 97.8 F (36.6 C), temperature source Oral, resp. rate (!) 28, height 5\' 7"  (1.702 m), weight 72.6 kg, SpO2 96 %.    FiO2 (%):  [100 %] 100 %   Intake/Output Summary (Last 24 hours) at 10/17/2018 0950 Last data filed at 10/17/2018 0100 Gross per 24 hour  Intake 1073 ml  Output 850 ml  Net 223 ml   Filed Weights   06-Nov-2018 2035  Weight: 72.6 kg   Examination:  General:  Prone positioned this AM, had increased WOB but resolved with prone positioning HENT: [place]/AT, PERRL, EOM-I and MMM PULM: Decreased BS diffusely CV: RRR, Nl S1/S2 and -M/R/G GI: Soft, NT, ND and +BS MSK: normal bulk and tone Neuro: Awake and interactive, moving all ext to command  I reviewed CXR myself, infiltrate noted  Resolved Hospital Problem list     Assessment & Plan:  COVID-19 pneumonia causing acute respiratory failure with hypoxemia: Hold in the ICU given desaturating Prone position today HFNC at 100% Target O2 saturation > 85% PT consult Low dose  methylprednisolone, continue for now  Diabetes mellitus type 2: Hemoglobin A1c 10.7 on admission SSI  Best practice:  Diet: as tolerated Pain/Anxiety/Delirium protocol (if indicated): n/a VAP protocol (if indicated): n/a DVT prophylaxis: lovenox bid GI prophylaxis: n/a Glucose control: SSI Mobility: monitor Code Status: Full Family Communication: per [facility] Disposition: remain in ICU  Labs   CBC: Recent Labs  Lab 10/12/18 0918 10/13/18 0500 10/14/18 0215 10/15/18 0200 10/16/18 0545 10/17/18 0145  WBC 12.3* 11.6* 9.2 10.8* 10.0 11.3*  NEUTROABS 10.0* 8.2* 8.0* 9.0* 8.0*  --   HGB 12.3* 13.7 13.6 13.9 14.8 15.2  HCT 38.0* 41.4 41.0 41.7 45.9 45.9  MCV 91.8 90.2 88.7 88.3 90.7 91.4  PLT 198 222 247 294 332 309   Basic Metabolic Panel: Recent Labs  Lab 10/11/18 2002  10/13/18 0500 10/14/18 0215 10/15/18 0200 10/16/18 0545 10/17/18 0145  NA 129*   < > 136 137 137 137 135  K 4.0   < > 3.2* 3.7 3.9 4.0 3.9  CL 98   < > 103 103 101 98 97*  CO2 22   < > 23 24 27 29 27   GLUCOSE 375*   < > 180* 240* 190* 141* 139*  BUN 18   < > 20 28* 33* 35* 29*  CREATININE 0.83   < > 0.65 0.76 0.66 0.74 0.71  CALCIUM 8.5*   < > 8.7* 8.6* 8.8* 8.9 9.0  MG 1.8  --   --  2.1  --   --  2.2  PHOS  --   --   --   --   --   --  3.9   < > = values in this interval not displayed.   GFR: Estimated Creatinine Clearance: 83.8 mL/min (by C-G formula based on SCr of 0.71 mg/dL). Recent Labs  Lab 06-26-2018 2057 10/11/18 0548  10/14/18 0215 10/15/18 0200 10/16/18 0545 10/17/18 0145  PROCALCITON  --  0.12  --   --   --   --   --   WBC  --   --    < > 9.2 10.8* 10.0 11.3*  LATICACIDVEN 1.2  --   --   --   --   --   --    < > = values in this interval not displayed.    Liver Function Tests: Recent Labs  Lab 10/13/18 0500 10/14/18 0215 10/15/18 0200 10/16/18 0545 10/17/18 0145  AST 35 34 36 44* 37  ALT 20 25 27  37 37  ALKPHOS 60 61 65 70 77  BILITOT 0.8 0.3 0.3 0.8 0.5  PROT 6.9 7.0  6.9 7.2 6.8  ALBUMIN 3.0* 3.0* 3.1* 3.5 3.3*   No results for input(s): LIPASE, AMYLASE in the last 168 hours. No results for input(s): AMMONIA in the last 168 hours.  ABG No results found for: PHART, PCO2ART, PO2ART, HCO3, TCO2, ACIDBASEDEF, O2SAT   Coagulation Profile: No results for input(s): INR, PROTIME in the last 168 hours.  Cardiac Enzymes: Recent Labs  Lab 10/12/18 0918 10/13/18 0500 10/14/18 0215  CKTOTAL 330 256 117    HbA1C: Hgb A1c MFr Bld  Date/Time Value Ref Range Status  10/11/2018 05:29 AM 10.7 (H) 4.8 - 5.6 % Final    Comment:    (NOTE) Pre diabetes:          5.7%-6.4% Diabetes:              >6.4% Glycemic control for   <7.0% adults with diabetes     CBG: Recent Labs  Lab 10/16/18 0816 10/16/18 1140 10/16/18 1648 10/16/18 1957 10/17/18 0800  GLUCAP 125* 207* 183* 375* 103*   Discussed with TRH-MD  Alyson ReedyWesam G. Danayah Smyre, M.D. [facility]. Pager: [phone]. After hours pager: [phone].

## 2018-10-17 NOTE — Progress Notes (Signed)
Pt now side-lying on left side. Maintaining SPO2

## 2018-10-17 NOTE — Progress Notes (Signed)
Daughter, [name], updated on patient this AM.   Pt laid prone for 2 hours, then up to chair for breakfast.   This RN attempted to set up bath for patient, pt declined. RN awaiting to get translator to see if male nurse could assist with bathing for modesty purposes. Will continue to monitor.

## 2018-10-17 NOTE — Progress Notes (Signed)
Pt has returned to supine position. SPO2 maintaining with HFNC and nonrebreather

## 2018-10-17 NOTE — Progress Notes (Signed)
Pt provided with iPad for entertainment. Pt currently has no complaints. No increased work of breathing, pt tolerating current oxygen requirements well.

## 2018-10-18 LAB — CBC
HCT: 46.5 % (ref 39.0–52.0)
Hemoglobin: 15.7 g/dL (ref 13.0–17.0)
MCH: 30.4 pg (ref 26.0–34.0)
MCHC: 33.8 g/dL (ref 30.0–36.0)
MCV: 89.9 fL (ref 80.0–100.0)
Platelets: 335 10*3/uL (ref 150–400)
RBC: 5.17 MIL/uL (ref 4.22–5.81)
RDW: 13.4 % (ref 11.5–15.5)
WBC: 11.2 10*3/uL — ABNORMAL HIGH (ref 4.0–10.5)
nRBC: 0 % (ref 0.0–0.2)

## 2018-10-18 LAB — C-REACTIVE PROTEIN: CRP: 1.6 mg/dL — ABNORMAL HIGH (ref <1.0)

## 2018-10-18 LAB — COMPREHENSIVE METABOLIC PANEL
ALT: 41 U/L (ref 0–44)
AST: 34 U/L (ref 15–41)
Albumin: 3.4 g/dL — ABNORMAL LOW (ref 3.5–5.0)
Alkaline Phosphatase: 74 U/L (ref 38–126)
Anion gap: 12 (ref 5–15)
BUN: 33 mg/dL — ABNORMAL HIGH (ref 8–23)
CO2: 29 mmol/L (ref 22–32)
Calcium: 8.9 mg/dL (ref 8.9–10.3)
Chloride: 92 mmol/L — ABNORMAL LOW (ref 98–111)
Creatinine, Ser: 0.89 mg/dL (ref 0.61–1.24)
GFR calc Af Amer: >60 mL/min (ref >60)
GFR calc non Af Amer: >60 mL/min (ref >60)
Glucose, Bld: 115 mg/dL — ABNORMAL HIGH (ref 70–99)
Potassium: 3.5 mmol/L (ref 3.5–5.1)
Sodium: 133 mmol/L — ABNORMAL LOW (ref 135–145)
Total Bilirubin: 0.6 mg/dL (ref 0.3–1.2)
Total Protein: 6.8 g/dL (ref 6.5–8.1)

## 2018-10-18 LAB — GLUCOSE, CAPILLARY
Glucose-Capillary: 134 mg/dL — ABNORMAL HIGH (ref 70–99)
Glucose-Capillary: 228 mg/dL — ABNORMAL HIGH (ref 70–99)
Glucose-Capillary: 278 mg/dL — ABNORMAL HIGH (ref 70–99)
Glucose-Capillary: 219 mg/dL — ABNORMAL HIGH (ref 70–99)

## 2018-10-18 LAB — PHOSPHORUS: Phosphorus: 5.3 mg/dL — ABNORMAL HIGH (ref 2.5–4.6)

## 2018-10-18 LAB — MAGNESIUM: Magnesium: 2.4 mg/dL (ref 1.7–2.4)

## 2018-10-18 LAB — FERRITIN: Ferritin: 205 ng/mL (ref 24–336)

## 2018-10-18 LAB — D-DIMER, QUANTITATIVE: D-Dimer, Quant: 1.36 ug/mL-FEU — ABNORMAL HIGH (ref 0.00–0.50)

## 2018-10-18 MED ORDER — POTASSIUM CHLORIDE CRYS ER 20 MEQ PO TBCR
40.0000 meq | EXTENDED_RELEASE_TABLET | Freq: Once | ORAL | Status: AC
  Administered 2018-10-18: 40 meq via ORAL
  Filled 2018-10-18: qty 2

## 2018-10-18 MED ORDER — FUROSEMIDE 10 MG/ML IJ SOLN
40.0000 mg | Freq: Once | INTRAMUSCULAR | Status: AC
  Administered 2018-10-18: 40 mg via INTRAVENOUS
  Filled 2018-10-18: qty 4

## 2018-10-18 NOTE — Progress Notes (Signed)
Pt having increased O2 needs over the past hour. Pt turned supine this past hour. Explained to patient that his oxygenation much improved when he was on his side (since he refused to prone) and pt acknowledged that he felt better on his side. Pt currently having increased WOB and decreased sats. Encouraged pt to lay on his side again, pt nodded in agreement to turn but then said he was okay to not turn.

## 2018-10-18 NOTE — Progress Notes (Signed)
Spoke with daughter Mora Appl this morning on updates of Mr. Vado. She stated she would talk to her mother on updates of him. Emotional support given.

## 2018-10-18 NOTE — Progress Notes (Signed)
Patient appeared stable.  PCCM will be available PRN  Alyson Reedy, M.D. [facility] Pulmonary/Critical Care Medicine. Pager: [phone]. After hours pager: [phone].

## 2018-10-18 NOTE — Progress Notes (Addendum)
0116: Pt desat to mid 80's. Encouraged deep breathing and coughing. Recovered sats to low 90's.   0124: Pt slowly started desatting to lower 80's again. Escorted pt back to bed to prone but pt refused prone position, will only lay on his sides. Oxygen dropped to low 70's. Increased HFNC to 40L and sats decreased lower to the lowest being 58%. RT notified and immediately at bedside. RT increased FiO2 to 80 and placed non-rebreather 15L overtop HFNC d/t increased respiratory effort with desat episode. Pt slowly recovered to sats >90. Pt now restly comfortably in bed.   Education provided to patient regarding prone positioning and its benefits throughout the night and verbalized understanding each time. When asking patient to prone, pt says no, he's good. More education provided by RN and RT, pt only agreeable to lay on his sides. Oxygenation does improve with this positioning.

## 2018-10-18 NOTE — Progress Notes (Signed)
PROGRESS NOTE  Austin Page BWL:[id number] DOB: 03-Oct-1950 DOA: 10/15/2018 PCP: Patient, [name]   LOS: 7 days   Brief Narrative / Interim history: 68 year old male with no significant past medical history came into the hospital and was admitted on 10/09/2018 with 3 days of fever, chills, cough, vomiting and weakness in the setting of having a normal Covid contact.  In the ER he was febrile, tachycardic, tachypneic, underwent a CT angiogram which showed peripheral groundglass opacities.  He was Covid positive himself.  He had rapid deterioration of respiratory status requiring 15 L high flow nasal cannula and was transferred to the ICU on 5/25  Subjective: Sitting in chair eating breakfast, feels improved.  FiO2 went down from 100% to 80% since yesterday.  Denies any chest pain, no abdominal pain, no nausea or vomiting.  Has good appetite  Assessment & Plan: Principal Problem:   Acute respiratory disease due to COVID-19 virus Active Problems:   Hyperglycemia   Hyponatremia   COVID-19   Dyspnea   Acute respiratory distress syndrome (ARDS) due to COVID-19 virus   Principal Problem Acute Hypoxic Respiratory Failure due to Covid-19 Viral Illness -Patient had a rapid progression of respiratory failure, initially on 2 L nasal cannula and has progressed to 15 L on therapy on 5/25 requiring transfer to the ICU.  His oxygenation seems to be improving, on 80% FiO2, will continue to wean off and he appears comfortable -Repeat Lasix today, good urine output over the last 24 hours, appears euvolemic on exam we will get as dry as possible.  Renal function stable -Monitor renal function, potassium, ins and outs -Continue to encourage proning  Treatment so far -Received Actemra x2 on 5/24 and 5/25 -Received convalescent plasma on 5/25  -continue Solu-Medrol for now -Remdesivir started on 5/24  The treatment plan and use of medications and known side effects were discussed with  patient/family, they were clearly explained that there is no proven definitive treatment for COVID-19 infection, any medications used here are based on published clinical articles/anecdotal data which are not peer-reviewed or randomized control trials.  Complete risks and long-term side effects are unknown, however in the best clinical judgment they seem to be of some clinical benefit rather than medical risks.  Patient agree with the treatment plan and want to receive the given medications.  COVID-19 Labs -Inflammatory markers improving  Recent Labs    10/16/18 0545 10/17/18 0145 10/18/18 0550  DDIMER 0.45 1.39* 1.36*  FERRITIN 258 235 205  CRP 4.5* 2.7* 1.6*    Lab Results  Component Value Date   SARSCOV2NAA POSITIVE (A) 10/15/2018    Active Problems New onset diabetes -Hemoglobin A1c 10.7, currently on Lantus 20 units in the morning and 5 in the evening, sliding scale plus NovoLog 10 units scheduled changed yesterday, CBGs much better, will keep on same regimen  Hyponatremia -Mild, in the setting of Lasix use, 133 this morning  Hypokalemia -3.5 this morning we will give an additional dose   Scheduled Meds: . sodium chloride   Intravenous Once  . chlorhexidine  15 mL Mouth Rinse BID  . enoxaparin (LOVENOX) injection  40 mg Subcutaneous Q12H  . furosemide  40 mg Intravenous Once  . insulin aspart  0-15 Units Subcutaneous TID WC  . insulin aspart  0-5 Units Subcutaneous QHS  . insulin aspart  10 Units Subcutaneous TID WC  . insulin glargine  20 Units Subcutaneous Daily  . insulin glargine  5 Units Subcutaneous QHS  . mouth  rinse  15 mL Mouth Rinse q12n4p  . methylPREDNISolone (SOLU-MEDROL) injection  30 mg Intravenous Daily  . sodium chloride flush  3 mL Intravenous Q12H   Continuous Infusions: . sodium chloride Stopped (10/16/18 1527)   PRN Meds:.sodium chloride, acetaminophen, HYDROcodone-acetaminophen, ondansetron **OR** ondansetron (ZOFRAN) IV, senna-docusate,  sodium chloride flush   DVT prophylaxis: Lovenox Code Status: Full code Family Communication: Discussed with daughter Lashawn Orrego [phone] Disposition Plan: Potential transfer to stepdown later this afternoon if respiratory status is stable  Consultants:   Critical care  Procedures:   None   Antimicrobials:  Remdesivir 5/24    Objective: Vitals:   10/18/18 0700 10/18/18 0733 10/18/18 0800 10/18/18 0900  BP: 138/82 138/82 (!) 140/48 (!) 145/77  Pulse: 90 88 94 100  Resp: (!) 24 (!) 23 (!) 27 (!) 26  Temp:   97.8 F (36.6 C)   TempSrc:   Oral   SpO2: (!) 85% 99% 97% 97%  Weight:      Height:        Intake/Output Summary (Last 24 hours) at 10/18/2018 0951 Last data filed at 10/18/2018 0900 Gross per 24 hour  Intake 1655.64 ml  Output 2100 ml  Net -444.36 ml   Filed Weights   20-Oct-2018 2035  Weight: 72.6 kg    Examination:  Constitutional: No distress, eating breakfast Eyes: No icterus ENMT: Moist membranes Respiratory: Decreased at the bases, no wheezing, no crackles.  Normal respiratory effort Cardiovascular: Regular rate and rhythm, no murmurs.  No edema Abdomen: Soft, NT, ND, bowel sounds positive Musculoskeletal: no clubbing / cyanosis Skin: No rashes seen Neurologic: Nonfocal, equal strength Psychiatric: Normal judgment and insight. Alert and oriented x 3. Normal mood.    Data Reviewed: I have independently reviewed following labs and imaging studies   CBC: Recent Labs  Lab 10/12/18 0918 10/13/18 0500 10/14/18 0215 10/15/18 0200 10/16/18 0545 10/17/18 0145 10/18/18 0550  WBC 12.3* 11.6* 9.2 10.8* 10.0 11.3* 11.2*  NEUTROABS 10.0* 8.2* 8.0* 9.0* 8.0*  --   --   HGB 12.3* 13.7 13.6 13.9 14.8 15.2 15.7  HCT 38.0* 41.4 41.0 41.7 45.9 45.9 46.5  MCV 91.8 90.2 88.7 88.3 90.7 91.4 89.9  PLT 198 222 247 294 332 309 335   Basic Metabolic Panel: Recent Labs  Lab 10/11/18 2002  10/14/18 0215 10/15/18 0200 10/16/18 0545 10/17/18 0145  10/18/18 0550  NA 129*   < > 137 137 137 135 133*  K 4.0   < > 3.7 3.9 4.0 3.9 3.5  CL 98   < > 103 101 98 97* 92*  CO2 22   < > GLUCOSE 375*   < > 240* 190* 141* 139* 115*  BUN 18   < > 28* 33* 35* 29* 33*  CREATININE 0.83   < > 0.76 0.66 0.74 0.71 0.89  CALCIUM 8.5*   < > 8.6* 8.8* 8.9 9.0 8.9  MG 1.8  --  2.1  --   --  2.2 2.4  PHOS  --   --   --   --   --  3.9 5.3*   < > = values in this interval not displayed.   GFR: Estimated Creatinine Clearance: 75.3 mL/min (by C-G formula based on SCr of 0.89 mg/dL). Liver Function Tests: Recent Labs  Lab 10/14/18 0215 10/15/18 0200 10/16/18 0545 10/17/18 0145 10/18/18 0550  AST 34 36 44* 37 34  ALT 25 27 37 37 41  ALKPHOS 61 65  70 77 74  BILITOT 0.3 0.3 0.8 0.5 0.6  PROT 7.0 6.9 7.2 6.8 6.8  ALBUMIN 3.0* 3.1* 3.5 3.3* 3.4*   No results for input(s): LIPASE, AMYLASE in the last 168 hours. No results for input(s): AMMONIA in the last 168 hours. Coagulation Profile: No results for input(s): INR, PROTIME in the last 168 hours. Cardiac Enzymes: Recent Labs  Lab 10/12/18 0918 10/13/18 0500 10/14/18 0215  CKTOTAL 330 256 117   BNP (last 3 results) No results for input(s): PROBNP in the last 8760 hours. HbA1C: No results for input(s): HGBA1C in the last 72 hours. CBG: Recent Labs  Lab 10/17/18 1149 10/17/18 1235 10/17/18 1703 10/17/18 2108 10/18/18 0737  GLUCAP 373* 341* 225* 273* 134*   Lipid Profile: No results for input(s): CHOL, HDL, LDLCALC, TRIG, CHOLHDL, LDLDIRECT in the last 72 hours. Thyroid Function Tests: No results for input(s): TSH, T4TOTAL, FREET4, T3FREE, THYROIDAB in the last 72 hours. Anemia Panel: Recent Labs    10/17/18 0145 10/18/18 0550  FERRITIN 235 205   Urine analysis: No results found for: COLORURINE, APPEARANCEUR, LABSPEC, PHURINE, GLUCOSEU, HGBUR, BILIRUBINUR, KETONESUR, PROTEINUR, UROBILINOGEN, NITRITE, LEUKOCYTESUR Sepsis Labs: Invalid input(s): PROCALCITONIN,  LACTICIDVEN  Recent Results (from the past 240 hour(s))  SARS Coronavirus 2 (CEPHEID- Performed in [facility] hospital lab), Hosp Order     Status: Abnormal   Collection Time: Oct 11, 2018  8:44 PM  Result Value Ref Range Status   SARS Coronavirus 2 POSITIVE (A) NEGATIVE Final    Comment: RESULT CALLED TO, READ BACK BY AND VERIFIED WITH: L CHILTON RN 2018/10/11 2232 JDW (NOTE) If result is NEGATIVE SARS-CoV-2 target nucleic acids are NOT DETECTED. The SARS-CoV-2 RNA is generally detectable in upper and lower  respiratory specimens during the acute phase of infection. The lowest  concentration of SARS-CoV-2 viral copies this assay can detect is 250  copies / mL. A negative result does not preclude SARS-CoV-2 infection  and should not be used as the sole basis for treatment or other  patient management decisions.  A negative result may occur with  improper specimen collection / handling, submission of specimen other  than nasopharyngeal swab, presence of viral mutation(s) within the  areas targeted by this assay, and inadequate number of viral copies  (<250 copies / mL). A negative result must be combined with clinical  observations, patient history, and epidemiological information. If result is POSITIVE SARS-CoV-2 target nucleic acids are DETECTED. The SAR S-CoV-2 RNA is generally detectable in upper and lower  respiratory specimens during the acute phase of infection.  Positive  results are indicative of active infection with SARS-CoV-2.  Clinical  correlation with patient history and other diagnostic information is  necessary to determine patient infection status.  Positive results do  not rule out bacterial infection or co-infection with other viruses. If result is PRESUMPTIVE POSTIVE SARS-CoV-2 nucleic acids MAY BE PRESENT.   A presumptive positive result was obtained on the submitted specimen  and confirmed on repeat testing.  While 2019 novel coronavirus  (SARS-CoV-2) nucleic acids  may be present in the submitted sample  additional confirmatory testing may be necessary for epidemiological  and / or clinical management purposes  to differentiate between  SARS-CoV-2 and other Sarbecovirus currently known to infect humans.  If clinically indicated additional testing with an alternate test  methodology [phone]) is advis ed. The SARS-CoV-2 RNA is generally  detectable in upper and lower respiratory specimens during the acute  phase of infection. The expected result is Negative. Fact  Sheet for Patients:  BoilerBrush.com.cyhttps://www.fda.gov/media/136312/download Fact Sheet for Healthcare Providers: https://pope.com/https://www.fda.gov/media/136313/download This test is not yet approved or cleared by the [place] FDA and has been authorized for detection and/or diagnosis of SARS-CoV-2 by FDA under an Emergency Use Authorization (EUA).  This EUA will remain in effect (meaning this test can be used) for the duration of the COVID-19 declaration under Section 564(b)(1) of the Act, 21 U.S.C. section 360bbb-3(b)(1), unless the authorization is terminated or revoked sooner. Performed at [facility], [address] [place], [place] [address]   Culture, blood (Routine X 2) w Reflex to ID Panel     Status: None   Collection Time: 10/11/18  5:30 AM  Result Value Ref Range Status   Specimen Description BLOOD LEFT HAND  Final   Special Requests   Final    BOTTLES DRAWN AEROBIC AND ANAEROBIC Blood Culture adequate volume   Culture   Final    NO GROWTH 5 DAYS Performed at [facility], [address] [place], [place] [address]    Report Status 10/16/2018 FINAL  Final  Culture, blood (Routine X 2) w Reflex to ID Panel     Status: None   Collection Time: 10/11/18  5:48 AM  Result Value Ref Range Status   Specimen Description BLOOD RIGHT HAND  Final   Special Requests   Final    BOTTLES DRAWN AEROBIC ONLY Blood Culture results may not be optimal due to an inadequate volume of blood received  in culture bottles   Culture   Final    NO GROWTH 5 DAYS Performed at [facility], [address] [place], [place] [address]    Report Status 10/16/2018 FINAL  Final      Radiology Studies: No results found.  Pamella Pertostin , MD, PhD [facility]  Contact via  [email or web address]  [facility] Office Info P: [phone]  F: [phone]

## 2018-10-19 ENCOUNTER — Inpatient Hospital Stay (HOSPITAL_COMMUNITY): Payer: BC Managed Care – PPO

## 2018-10-19 LAB — GLUCOSE, CAPILLARY
Glucose-Capillary: 122 mg/dL — ABNORMAL HIGH (ref 70–99)
Glucose-Capillary: 381 mg/dL — ABNORMAL HIGH (ref 70–99)
Glucose-Capillary: 342 mg/dL — ABNORMAL HIGH (ref 70–99)
Glucose-Capillary: 153 mg/dL — ABNORMAL HIGH (ref 70–99)

## 2018-10-19 LAB — FERRITIN: Ferritin: 191 ng/mL (ref 24–336)

## 2018-10-19 LAB — CBC
HCT: 46.8 % (ref 39.0–52.0)
Hemoglobin: 15.8 g/dL (ref 13.0–17.0)
MCH: 29.9 pg (ref 26.0–34.0)
MCHC: 33.8 g/dL (ref 30.0–36.0)
MCV: 88.6 fL (ref 80.0–100.0)
Platelets: 349 10*3/uL (ref 150–400)
RBC: 5.28 MIL/uL (ref 4.22–5.81)
RDW: 13.5 % (ref 11.5–15.5)
WBC: 13.2 10*3/uL — ABNORMAL HIGH (ref 4.0–10.5)
nRBC: 0 % (ref 0.0–0.2)

## 2018-10-19 LAB — D-DIMER, QUANTITATIVE: D-Dimer, Quant: 1.84 ug/mL-FEU — ABNORMAL HIGH (ref 0.00–0.50)

## 2018-10-19 LAB — PHOSPHORUS: Phosphorus: 4.1 mg/dL (ref 2.5–4.6)

## 2018-10-19 LAB — BRAIN NATRIURETIC PEPTIDE: B Natriuretic Peptide: 25.4 pg/mL (ref 0.0–100.0)

## 2018-10-19 LAB — COMPREHENSIVE METABOLIC PANEL
ALT: 36 U/L (ref 0–44)
AST: 35 U/L (ref 15–41)
Albumin: 3.4 g/dL — ABNORMAL LOW (ref 3.5–5.0)
Alkaline Phosphatase: 80 U/L (ref 38–126)
Anion gap: 15 (ref 5–15)
BUN: 34 mg/dL — ABNORMAL HIGH (ref 8–23)
CO2: 24 mmol/L (ref 22–32)
Calcium: 9.1 mg/dL (ref 8.9–10.3)
Chloride: 95 mmol/L — ABNORMAL LOW (ref 98–111)
Creatinine, Ser: 0.76 mg/dL (ref 0.61–1.24)
GFR calc Af Amer: >60 mL/min (ref >60)
GFR calc non Af Amer: >60 mL/min (ref >60)
Glucose, Bld: 81 mg/dL (ref 70–99)
Potassium: 4.6 mmol/L (ref 3.5–5.1)
Sodium: 134 mmol/L — ABNORMAL LOW (ref 135–145)
Total Bilirubin: 0.5 mg/dL (ref 0.3–1.2)
Total Protein: 6.8 g/dL (ref 6.5–8.1)

## 2018-10-19 LAB — MAGNESIUM: Magnesium: 2.4 mg/dL (ref 1.7–2.4)

## 2018-10-19 LAB — C-REACTIVE PROTEIN: CRP: 1 mg/dL — ABNORMAL HIGH (ref <1.0)

## 2018-10-19 MED ORDER — FUROSEMIDE 10 MG/ML IJ SOLN
40.0000 mg | INTRAMUSCULAR | Status: AC
  Administered 2018-10-19: 40 mg via INTRAVENOUS
  Filled 2018-10-19: qty 4

## 2018-10-19 NOTE — Progress Notes (Signed)
Patient desat to 81% on 80%FiO2 35 L/min. Repositioned patient to right side as he would not prone. SPO2 to 83-84%, RR upper 30's. Observed for a few minutes. Increased setting to 90%FiO2 35 L/min. O2 sat now 92%, RR mid-upper 20's. Will continue to monitor closely.

## 2018-10-19 NOTE — Progress Notes (Signed)
Utilized [employer] Hindi Interpreter ID [id number] for assessment. Questions encouraged and answered.

## 2018-10-19 NOTE — Progress Notes (Signed)
[facility] Department and AHF Team  ReDS Research Project   Patient #: [id number]  ReDS Measurement  Right: 44 %  Left: 41 %

## 2018-10-19 NOTE — Progress Notes (Signed)
Utilized [employer] Voice Interpreter service to introduce myself to the patient, discuss medications and plan of care. Questions encouraged and answered.

## 2018-10-19 NOTE — Progress Notes (Signed)
PROGRESS NOTE                                                                                                                                                                                                             Patient Demographics:    Austin Page, is a 68 y.o. male, DOB - 1951/04/25, HDQ:[id number]  Outpatient Primary MD for the patient is Patient, [name]    LOS - 8  Admit date - 10/28/2018    Chief Complaint  Patient presents with   Cough       Brief Narrative  68 year old male with no significant past medical history came into the hospital and was admitted on 10-28-18 with 3 days of fever, chills, cough, vomiting and weakness in the setting of having a normal Covid contact.  In the ER he was febrile, tachycardic, tachypneic, underwent a CT angiogram which showed peripheral groundglass opacities.  He was Covid positive himself.  He had rapid deterioration of respiratory status requiring 15 L high flow nasal cannula and was transferred to the ICU on 5/25, he was transferred out of ICU on 10/19/2018 and transferred to my care on the same date.   Subjective:    [name] today has, No headache, No chest pain, No abdominal pain - No Nausea, No new weakness tingling or numbness,   Cough, improving SOB.     Assessment  & Plan :     1. Acute Hypoxic Resp. Failure due to Acute Covid 19 Viral Illness during the ongoing 2020 Covid 19 Pandemic -has received appropriate treatment which include IV steroids, 2 doses of Actemra along with convalescent plasma.  Clinically improving although still on high flow nasal cannula, encouraged to sit up in chair use flutter valve and I-S for pulmonary toiletry.  Inflammatory markers have stabilized we will continue to monitor closely.  Still tenuous.  We will also continue IV Lasix as he had Rales on exam, trend BNP.  Require outpatient echocardiogram and cardiology  follow-up.  FiO2 (%):  [50 %-90 %] 50 %    COVID-19 Labs  Recent Labs    10/17/18 0145 10/18/18 0550 10/19/18 0347  DDIMER 1.39* 1.36* 1.84*  FERRITIN 235 205 191  CRP 2.7* 1.6* 1.0*    Lab Results  Component Value Date   SARSCOV2NAA POSITIVE (A) 28-Oct-2018  No results found for: BNP  Treatment so far -Received Actemra x2 on 5/24 and 5/25 -Received convalescent plasma on 5/25 -continue Solu-Medrol for now -Remdesivir started on 5/24   Hyponatremia - still has Rales we will check BNP, diurese and monitor.  Hypokalemia - replaced and stable   Newly Diagnosed DM2 - on Lantus + ISS, will provide DM and Insulin education  Lab Results  Component Value Date   HGBA1C 10.7 (H) 10/11/2018   CBG (last 3)  Recent Labs    10/18/18 1545 10/18/18 2112 10/19/18 0824  GLUCAP 278* 219* 122*      Condition - Extremely Guarded  Family Communication  : None  Code Status :  Full  Diet : Regular  Disposition Plan  :  Home 3-4 days  Consults  :  None  Procedures  :  None  PUD Prophylaxis : None  DVT Prophylaxis  :  Lovenox   Lab Results  Component Value Date   PLT 349 10/19/2018    Inpatient Medications  Scheduled Meds:  sodium chloride   Intravenous Once   chlorhexidine  15 mL Mouth Rinse BID   enoxaparin (LOVENOX) injection  40 mg Subcutaneous Q12H   insulin aspart  0-15 Units Subcutaneous TID WC   insulin aspart  0-5 Units Subcutaneous QHS   insulin aspart  10 Units Subcutaneous TID WC   insulin glargine  20 Units Subcutaneous Daily   insulin glargine  5 Units Subcutaneous QHS   mouth rinse  15 mL Mouth Rinse q12n4p   methylPREDNISolone (SOLU-MEDROL) injection  30 mg Intravenous Daily   sodium chloride flush  3 mL Intravenous Q12H   Continuous Infusions:  sodium chloride Stopped (10/16/18 1527)   PRN Meds:.sodium chloride, acetaminophen, HYDROcodone-acetaminophen, ondansetron **OR** ondansetron (ZOFRAN) IV, senna-docusate,  sodium chloride flush  Antibiotics  :    Anti-infectives (From admission, onward)   Start     Dose/Rate Route Frequency Ordered Stop   10/13/18 1400  remdesivir 100 mg in sodium chloride 0.9 % 230 mL IVPB     100 mg over 30 Minutes Intravenous Every 24 hours 10/12/18 1113 10/16/18 1930   10/12/18 1400  remdesivir 200 mg in sodium chloride 0.9 % 210 mL IVPB     200 mg over 30 Minutes Intravenous Once 10/12/18 1113 10/12/18 1749       Time Spent in minutes  30   Susa Raring M.D on 10/19/2018 at 12:26 PM  To page go to [email or web address] - password [facility]  [facility] -  Office  [phone]   See all Orders from today for further details    Objective:   Vitals:   10/19/18 0500 10/19/18 0759 10/19/18 0800 10/19/18 0831  BP:   121/79 121/79  Pulse: 99  (!) 105 (!) 123  Resp: (!) 36  (!) 28 (!) 37  Temp:  98.8 F (37.1 C)    TempSrc:  Oral    SpO2: (!) 88%  90% (!) 87%  Weight:      Height:        Wt Readings from Last 3 Encounters:  09/29/2018 72.6 kg     Intake/Output Summary (Last 24 hours) at 10/19/2018 1226 Last data filed at 10/19/2018 0800 Gross per 24 hour  Intake 660 ml  Output 950 ml  Net -290 ml     Physical Exam  Awake Alert, Oriented X 3, No new F.N deficits, Normal affect [place].AT,PERRAL Supple Neck,No JVD, No cervical lymphadenopathy appriciated.  Symmetrical Chest wall movement, Good air  movement bilaterally, few rales RRR,No Gallops,Rubs or new Murmurs, No Parasternal Heave +ve B.Sounds, Abd Soft, No tenderness, No organomegaly appriciated, No rebound - guarding or rigidity. No Cyanosis, Clubbing or edema, No new Rash or bruise      Data Review:    CBC Recent Labs  Lab 10/13/18 0500 10/14/18 0215 10/15/18 0200 10/16/18 0545 10/17/18 0145 10/18/18 0550 10/19/18 0347  WBC 11.6* 9.2 10.8* 10.0 11.3* 11.2* 13.2*  HGB 13.7 13.6 13.9 14.8 15.2 15.7 15.8  HCT 41.4 41.0 41.7 45.9 45.9 46.5 46.8  PLT 222 247 294 332 309 335 349  MCV  90.2 88.7 88.3 90.7 91.4 89.9 88.6  MCH 29.8 29.4 29.4 29.2 30.3 30.4 29.9  MCHC 33.1 33.2 33.3 32.2 33.1 33.8 33.8  RDW 13.2 13.2 13.3 13.5 13.5 13.4 13.5  LYMPHSABS 2.9 0.9 0.9 1.1  --   --   --   MONOABS 0.4 0.3 0.8 0.8  --   --   --   EOSABS 0.0 0.0 0.0 0.0  --   --   --   BASOSABS 0.0 0.0 0.0 0.0  --   --   --     Chemistries  Recent Labs  Lab 10/14/18 0215 10/15/18 0200 10/16/18 0545 10/17/18 0145 10/18/18 0550 10/19/18 0347  NA 137 137 137 135 133* 134*  K 3.7 3.9 4.0 3.9 3.5 4.6  CL 103 101 98 97* 92* 95*  CO2 GLUCOSE 240* 190* 141* 139* 115* 81  BUN 28* 33* 35* 29* 33* 34*  CREATININE 0.76 0.66 0.74 0.71 0.89 0.76  CALCIUM 8.6* 8.8* 8.9 9.0 8.9 9.1  MG 2.1  --   --  2.2 2.4 2.4  AST 34 36 44* 37 34 35  ALT 25 27 37 37 41 36  ALKPHOS 61 65 70 77 74 80  BILITOT 0.3 0.3 0.8 0.5 0.6 0.5   ------------------------------------------------------------------------------------------------------------------ No results for input(s): CHOL, HDL, LDLCALC, TRIG, CHOLHDL, LDLDIRECT in the last 72 hours.  Lab Results  Component Value Date   HGBA1C 10.7 (H) 10/11/2018   ------------------------------------------------------------------------------------------------------------------ No results for input(s): TSH, T4TOTAL, T3FREE, THYROIDAB in the last 72 hours.  Invalid input(s): FREET3  Cardiac Enzymes No results for input(s): CKMB, TROPONINI, MYOGLOBIN in the last 168 hours.  Invalid input(s): CK ------------------------------------------------------------------------------------------------------------------ No results found for: BNP  Micro Results Recent Results (from the past 240 hour(s))  SARS Coronavirus 2 (CEPHEID- Performed in [facility] hospital lab), Hosp Order     Status: Abnormal   Collection Time: 09/23/2018  8:44 PM  Result Value Ref Range Status   SARS Coronavirus 2 POSITIVE (A) NEGATIVE Final    Comment: RESULT CALLED TO, READ BACK BY  AND VERIFIED WITH: L CHILTON RN 10/01/2018 2232 JDW (NOTE) If result is NEGATIVE SARS-CoV-2 target nucleic acids are NOT DETECTED. The SARS-CoV-2 RNA is generally detectable in upper and lower  respiratory specimens during the acute phase of infection. The lowest  concentration of SARS-CoV-2 viral copies this assay can detect is 250  copies / mL. A negative result does not preclude SARS-CoV-2 infection  and should not be used as the sole basis for treatment or other  patient management decisions.  A negative result may occur with  improper specimen collection / handling, submission of specimen other  than nasopharyngeal swab, presence of viral mutation(s) within the  areas targeted by this assay, and inadequate number of viral copies  (<250 copies / mL). A negative result must be  combined with clinical  observations, patient history, and epidemiological information. If result is POSITIVE SARS-CoV-2 target nucleic acids are DETECTED. The SAR S-CoV-2 RNA is generally detectable in upper and lower  respiratory specimens during the acute phase of infection.  Positive  results are indicative of active infection with SARS-CoV-2.  Clinical  correlation with patient history and other diagnostic information is  necessary to determine patient infection status.  Positive results do  not rule out bacterial infection or co-infection with other viruses. If result is PRESUMPTIVE POSTIVE SARS-CoV-2 nucleic acids MAY BE PRESENT.   A presumptive positive result was obtained on the submitted specimen  and confirmed on repeat testing.  While 2019 novel coronavirus  (SARS-CoV-2) nucleic acids may be present in the submitted sample  additional confirmatory testing may be necessary for epidemiological  and / or clinical management purposes  to differentiate between  SARS-CoV-2 and other Sarbecovirus currently known to infect humans.  If clinically indicated additional testing with an alternate test   methodology [phone]) is advis ed. The SARS-CoV-2 RNA is generally  detectable in upper and lower respiratory specimens during the acute  phase of infection. The expected result is Negative. Fact Sheet for Patients:  BoilerBrush.com.cy Fact Sheet for Healthcare Providers: https://pope.com/ This test is not yet approved or cleared by the [place] FDA and has been authorized for detection and/or diagnosis of SARS-CoV-2 by FDA under an Emergency Use Authorization (EUA).  This EUA will remain in effect (meaning this test can be used) for the duration of the COVID-19 declaration under Section 564(b)(1) of the Act, 21 U.S.C. section 360bbb-3(b)(1), unless the authorization is terminated or revoked sooner. Performed at [facility], [address] [place], [place] [address]   Culture, blood (Routine X 2) w Reflex to ID Panel     Status: None   Collection Time: 10/11/18  5:30 AM  Result Value Ref Range Status   Specimen Description BLOOD LEFT HAND  Final   Special Requests   Final    BOTTLES DRAWN AEROBIC AND ANAEROBIC Blood Culture adequate volume   Culture   Final    NO GROWTH 5 DAYS Performed at [facility], [address] [place], [place] [address]    Report Status 10/16/2018 FINAL  Final  Culture, blood (Routine X 2) w Reflex to ID Panel     Status: None   Collection Time: 10/11/18  5:48 AM  Result Value Ref Range Status   Specimen Description BLOOD RIGHT HAND  Final   Special Requests   Final    BOTTLES DRAWN AEROBIC ONLY Blood Culture results may not be optimal due to an inadequate volume of blood received in culture bottles   Culture   Final    NO GROWTH 5 DAYS Performed at [facility], [address] [place], [place] [address]    Report Status 10/16/2018 FINAL  Final    Radiology Reports Ct Angio Chest Pe W And/or Wo Contrast  Result Date: 10/11/2018 CLINICAL DATA:  68 y/o  M; cough, fever,  chills.  COVID-19 positive. EXAM: CT ANGIOGRAPHY CHEST WITH CONTRAST TECHNIQUE: Multidetector CT imaging of the chest was performed using the standard protocol during bolus administration of intravenous contrast. Multiplanar CT image reconstructions and MIPs were obtained to evaluate the vascular anatomy. CONTRAST:  OMNIPAQUE IOHEXOL 300 MG/ML  SOLN COMPARISON:  10/17/2018 chest radiograph FINDINGS: Cardiovascular: Mild respiratory motion artifact. Satisfactory opacification of the pulmonary arteries to the segmental level. No evidence of  pulmonary embolism. Normal heart size. No pericardial effusion. Mild aortic and moderate coronary artery calcific atherosclerosis. Mediastinum/Nodes: No enlarged mediastinal, hilar, or axillary lymph nodes. Thyroid gland, trachea, and esophagus demonstrate no significant findings. Lungs/Pleura: Multiple peripheral ground-glass opacities with the largest confluent opacity in the right upper lobe. The right upper lobe demonstrates intra lobular septal thickening "crazy paving" pattern. No pleural effusion or pneumothorax. Upper Abdomen: No acute abnormality. Musculoskeletal: No chest wall abnormality. No acute or significant osseous findings. Review of the MIP images confirms the above findings. IMPRESSION: 1. Mild respiratory motion artifact. No pulmonary embolus identified. 2. There are a spectrum of findings in the lungs which can be seen with acute atypical infection (as well as other non-infectious etiologies). In particular, viral pneumonia (including COVID-19) should be considered in the appropriate clinical setting. Critical Value/emergent results were called by telephone at the time of interpretation on 10/11/2018 at 3:08 am to [facility] , who verbally acknowledged these results. Electronically Signed   By: Mitzi HansenLance  Furusawa-Stratton M.D.   On: 10/11/2018 03:21   Dg Chest Port 1 View  Result Date: 10/19/2018 CLINICAL DATA:  Shortness of breath EXAM: PORTABLE  CHEST 1 VIEW COMPARISON:  10/16/2018 FINDINGS: Multifocal airspace opacities, improved in the upper lungs bilaterally, persistent at the lung bases. This appearance favors improving multifocal pneumonia. No pleural effusion or pneumothorax. The heart is normal in size. IMPRESSION: Improving multifocal pneumonia, as above. Electronically Signed   By: Charline BillsSriyesh  Krishnan M.D.   On: 10/19/2018 10:21   Dg Chest Port 1 View  Result Date: 10/16/2018 CLINICAL DATA:  Dyspnea.COVID19 EXAM: PORTABLE CHEST 1 VIEW COMPARISON:  10/12/2018 FINDINGS: Heart size appears normal. No pleural effusion identified. Diffuse hazy opacities are noted in the left lung. Airspace consolidation within the right upper lobe and left base is improved from previous exam. IMPRESSION: 1. Persistent left lung hazy opacities compatible with inflammation/infection. 2. Improving consolidation within the right upper lobe and left base. Electronically Signed   By: Signa Kellaylor  Stroud M.D.   On: 10/16/2018 09:15   Dg Chest Port 1 View  Result Date: 10/12/2018 CLINICAL DATA:  Weakness and fever, history of COVID-19 EXAM: PORTABLE CHEST 1 VIEW COMPARISON:  10/11/2018 FINDINGS: Cardiac shadow is stable. Patchy infiltrates are seen bilaterally particularly in the right upper lobe consistent with that seen on recent CT examination and consistent with the patient's given clinical history. No sizable effusion is seen. No bony abnormality is noted. Elevation the right hemidiaphragm is again noted. IMPRESSION: Patchy infiltrates predominately within the right upper lobe consistent with the given clinical history of COVID-19 positivity. Electronically Signed   By: Alcide CleverMark  Lukens M.D.   On: 10/12/2018 03:59   Dg Chest Portable 1 View  Result Date: 2018/08/29 CLINICAL DATA:  Symptoms of possible COVID-19 EXAM: PORTABLE CHEST 1 VIEW COMPARISON:  None. FINDINGS: The heart size and mediastinal contours are within normal limits. Both lungs are clear. The visualized  skeletal structures are unremarkable. IMPRESSION: No active disease. Electronically Signed   By: Deatra RobinsonKevin  Herman M.D.   On: 02020/04/10 22:55

## 2018-10-19 NOTE — Progress Notes (Signed)
Updated daughter, [name] via phone. Questions encouraged and answered.

## 2018-10-20 LAB — CBC WITH DIFFERENTIAL/PLATELET
Abs Immature Granulocytes: 0.14 10*3/uL — ABNORMAL HIGH (ref 0.00–0.07)
Basophils Absolute: 0 10*3/uL (ref 0.0–0.1)
Basophils Relative: 0 %
Eosinophils Absolute: 0.2 10*3/uL (ref 0.0–0.5)
Eosinophils Relative: 1 %
HCT: 47.9 % (ref 39.0–52.0)
Hemoglobin: 16 g/dL (ref 13.0–17.0)
Immature Granulocytes: 1 %
Lymphocytes Relative: 12 %
Lymphs Abs: 1.7 10*3/uL (ref 0.7–4.0)
MCH: 29.8 pg (ref 26.0–34.0)
MCHC: 33.4 g/dL (ref 30.0–36.0)
MCV: 89.2 fL (ref 80.0–100.0)
Monocytes Absolute: 0.4 10*3/uL (ref 0.1–1.0)
Monocytes Relative: 3 %
Neutro Abs: 11.5 10*3/uL — ABNORMAL HIGH (ref 1.7–7.7)
Neutrophils Relative %: 83 %
Platelets: 337 10*3/uL (ref 150–400)
RBC: 5.37 MIL/uL (ref 4.22–5.81)
RDW: 13.7 % (ref 11.5–15.5)
WBC: 13.9 10*3/uL — ABNORMAL HIGH (ref 4.0–10.5)
nRBC: 0 % (ref 0.0–0.2)

## 2018-10-20 LAB — MAGNESIUM: Magnesium: 2.4 mg/dL (ref 1.7–2.4)

## 2018-10-20 LAB — COMPREHENSIVE METABOLIC PANEL
ALT: 32 U/L (ref 0–44)
AST: 31 U/L (ref 15–41)
Albumin: 3.3 g/dL — ABNORMAL LOW (ref 3.5–5.0)
Alkaline Phosphatase: 78 U/L (ref 38–126)
Anion gap: 13 (ref 5–15)
BUN: 40 mg/dL — ABNORMAL HIGH (ref 8–23)
CO2: 27 mmol/L (ref 22–32)
Calcium: 9 mg/dL (ref 8.9–10.3)
Chloride: 93 mmol/L — ABNORMAL LOW (ref 98–111)
Creatinine, Ser: 0.72 mg/dL (ref 0.61–1.24)
GFR calc Af Amer: >60 mL/min (ref >60)
GFR calc non Af Amer: >60 mL/min (ref >60)
Glucose, Bld: 99 mg/dL (ref 70–99)
Potassium: 3.8 mmol/L (ref 3.5–5.1)
Sodium: 133 mmol/L — ABNORMAL LOW (ref 135–145)
Total Bilirubin: 0.6 mg/dL (ref 0.3–1.2)
Total Protein: 6.8 g/dL (ref 6.5–8.1)

## 2018-10-20 LAB — GLUCOSE, CAPILLARY
Glucose-Capillary: 122 mg/dL — ABNORMAL HIGH (ref 70–99)
Glucose-Capillary: 258 mg/dL — ABNORMAL HIGH (ref 70–99)
Glucose-Capillary: 142 mg/dL — ABNORMAL HIGH (ref 70–99)
Glucose-Capillary: 172 mg/dL — ABNORMAL HIGH (ref 70–99)

## 2018-10-20 LAB — FERRITIN: Ferritin: 168 ng/mL (ref 24–336)

## 2018-10-20 LAB — BRAIN NATRIURETIC PEPTIDE: B Natriuretic Peptide: 25.2 pg/mL (ref 0.0–100.0)

## 2018-10-20 LAB — LACTATE DEHYDROGENASE: LDH: 379 U/L — ABNORMAL HIGH (ref 98–192)

## 2018-10-20 LAB — PHOSPHORUS: Phosphorus: 4.6 mg/dL (ref 2.5–4.6)

## 2018-10-20 LAB — D-DIMER, QUANTITATIVE: D-Dimer, Quant: 1.8 ug/mL-FEU — ABNORMAL HIGH (ref 0.00–0.50)

## 2018-10-20 LAB — C-REACTIVE PROTEIN: CRP: <0.8 mg/dL (ref <1.0)

## 2018-10-20 MED ORDER — INSULIN STARTER KIT- SYRINGES (ENGLISH)
1.0000 | Freq: Once | Status: DC
  Filled 2018-10-20: qty 1

## 2018-10-20 MED ORDER — LIVING WELL WITH DIABETES BOOK
Freq: Once | Status: DC
  Filled 2018-10-20 (×2): qty 1

## 2018-10-20 NOTE — Progress Notes (Signed)
Spoke with patients daughter on the phone, updated her, and she stated she has no new questions at this time.

## 2018-10-20 NOTE — Progress Notes (Signed)
A diabetic care consult has been initiated but the diabetic RN does not come to the [facility] campus. I spoke with her on the phone and explained that the patient does not speak english. She advised me to do diabetic education with an interpreter. I educated the pt this afternoon, with an interpreter, about diet, short acting and long acting coverage, as well as how to check for blood sugar and how to administer your own insulin injection. The patient checked his own blood sugar and gave himself an injection. I explained to him that this is how we would be checking his blood sugar going forward. He told the interpreter he understood and stated he did not have any further questions at this time.

## 2018-10-20 NOTE — Progress Notes (Addendum)
Patient tachy and RR 34-41. SPO2  82-86% on FiO2 @60  & 25L/min. Asked CN to call RT for assessment and assistance. Patient says he is okay. A&O. Right side-lying.  RT adjusted settings to FiO2 80 & 25L/min.

## 2018-10-20 NOTE — Progress Notes (Signed)
CardioVascular Research Department and AHF Team  ReDS Research Project   Patient #: [id number]  ReDS Measurement  Right: 46 %  Left: 28 %

## 2018-10-20 NOTE — Progress Notes (Signed)
Patient requested to be helped from chair to bed. Assisted to bed in the right side-lying position. Desat 79-83% in the transition. Encouraged deep breaths in through the nose and coughing/deep breathing. After a couple of minutes, sats returned to 88-89% on 50%FiO2 @ 25L/min.   RT came to assess. Increased FiO2 to 60. Sats 89-91%.  Will continue to assess.

## 2018-10-20 NOTE — Progress Notes (Addendum)
Inpatient Diabetes Program Recommendations  AACE/ADA: New Consensus Statement on Inpatient Glycemic Control (2015)  Target Ranges:  Prepandial:   less than 140 mg/dL      Peak postprandial:   less than 180 mg/dL (1-2 hours)      Critically ill patients:  140 - 180 mg/dL   Lab Results  Component Value Date   GLUCAP 122 (H) 10/20/2018   HGBA1C 10.7 (H) 10/11/2018    Review of Glycemic Control  Inpatient Diabetes Program Recommendations:   Received consult for diabetes and insulin teaching. Ordered Living Well With Diabetes Book along with patient education videos and starter kit with syringes. Nurses, please continue teaching insulin injections and allow patient to give own injections as appropriate. Will follow and speak to patient when he is feeling better. O2 dropped this am so will contact another time. 12:15 Spoke with RN Austin Page and planned for RN to start teaching insulin administration @ 4 pm insulin dose today. Spoke with patient's daughter Austin Page @ [phone] and reviewed that patient will probably discharge home on insulin. Daughter speaks and reads English and glad to assist patient. Gave daughter information about option of buying Relion glucose meter from  [facility] for approximately $25 for the meter and 100 strips and she states agreement to purchase. Also reviewed cost of Novolin insulin @ [facility] if patient is discharged on 70/30 due to lack of insurance for medication coverage.  Thank you, Austin Page. Austin Berland, RN, MSN, CDE  Diabetes Coordinator Inpatient Glycemic Control Team Team Pager [phone] (8am-5pm) 10/20/2018 10:05 AM

## 2018-10-20 NOTE — Progress Notes (Signed)
PROGRESS NOTE                                                                                                                                                                                                             Patient Demographics:    Austin Page, is a 68 y.o. male, DOB - Oct 11, 1950, LKJ:[id number]  Outpatient Primary MD for the patient is Patient, [name]    LOS - 9  Admit date - 09/25/2018    Chief Complaint  Patient presents with   Cough       Brief Narrative  68 year old male with no significant past medical history came into the hospital and was admitted on 10/05/2018 with 3 days of fever, chills, cough, vomiting and weakness in the setting of having a normal Covid contact.  In the ER he was febrile, tachycardic, tachypneic, underwent a CT angiogram which showed peripheral groundglass opacities.  He was Covid positive himself.  He had rapid deterioration of respiratory status requiring 15 L high flow nasal cannula and was transferred to the ICU on 5/25, he was transferred out of ICU on 10/19/2018 and transferred to my care on the same date.   Subjective:   Patient in bed, appears comfortable, denies any headache, no fever, no chest pain or pressure, no shortness of breath , no abdominal pain. No focal weakness.    Assessment  & Plan :     1. Acute Hypoxic Resp. Failure due to Acute Covid 19 Viral Illness during the ongoing 2020 Covid 19 Pandemic -has received appropriate treatment which include IV steroids, 2 doses of Actemra along with convalescent plasma.  Clinically improving although still on high flow nasal cannula, encouraged to sit up in chair use flutter valve and I-S for pulmonary toiletry.  Inflammatory markers have stabilized we will continue to monitor closely.  Still tenuous.  We will also continue IV Lasix as he had Rales on exam, trend BNP.  Require outpatient echocardiogram and cardiology  follow-up.  FiO2 (%):  [50 %-80 %] 65 %    COVID-19 Labs  Recent Labs    10/18/18 0550 10/19/18 0347 10/20/18 0525  DDIMER 1.36* 1.84* 1.80*  FERRITIN 205 191 168  LDH  --   --  379*  CRP 1.6* 1.0* <0.8    Lab Results  Component Value Date  SARSCOV2NAA POSITIVE (A) 09/23/2018        Component Value Date/Time   BNP 25.2 10/20/2018 0525    Treatment so far -Received Actemra x2 on 5/24 and 5/25 -Received convalescent plasma on 5/25 -continue Solu-Medrol for now -Remdesivir started on 5/24   Hyponatremia - still has Rales we will check BNP, diurese and monitor.  Hypokalemia - replaced and stable   Newly Diagnosed DM2 -poor outpatient control due to hyperglycemia, on Lantus + ISS, will provide DM and Insulin education.  Lab Results  Component Value Date   HGBA1C 10.7 (H) 10/11/2018   CBG (last 3)  Recent Labs    10/19/18 2133 10/20/18 0827 10/20/18 1125  GLUCAP 153* 122* 258*      Condition - Extremely Guarded  Family Communication  : None  Code Status :  Full  Diet : Regular  Disposition Plan  :  Home 3-4 days  Consults  :  None  Procedures  :  None  PUD Prophylaxis : None  DVT Prophylaxis  :  Lovenox   Lab Results  Component Value Date   PLT 337 10/20/2018    Inpatient Medications  Scheduled Meds:  chlorhexidine  15 mL Mouth Rinse BID   enoxaparin (LOVENOX) injection  40 mg Subcutaneous Q12H   insulin aspart  0-15 Units Subcutaneous TID WC   insulin aspart  0-5 Units Subcutaneous QHS   insulin aspart  10 Units Subcutaneous TID WC   insulin glargine  20 Units Subcutaneous Daily   insulin glargine  5 Units Subcutaneous QHS   insulin starter kit- syringes  1 kit Other Once   mouth rinse  15 mL Mouth Rinse q12n4p   methylPREDNISolone (SOLU-MEDROL) injection  30 mg Intravenous Daily   sodium chloride flush  3 mL Intravenous Q12H   Continuous Infusions:  sodium chloride Stopped (10/16/18 1527)   PRN Meds:.sodium  chloride, acetaminophen, HYDROcodone-acetaminophen, ondansetron **OR** ondansetron (ZOFRAN) IV, senna-docusate, sodium chloride flush  Antibiotics  :    Anti-infectives (From admission, onward)   Start     Dose/Rate Route Frequency Ordered Stop   10/13/18 1400  remdesivir 100 mg in sodium chloride 0.9 % 230 mL IVPB     100 mg over 30 Minutes Intravenous Every 24 hours 10/12/18 1113 10/16/18 1930   10/12/18 1400  remdesivir 200 mg in sodium chloride 0.9 % 210 mL IVPB     200 mg over 30 Minutes Intravenous Once 10/12/18 1113 10/12/18 1749       Time Spent in minutes  Dana M.D on 10/20/2018 at 11:51 AM  To page go to [email or web address] - password [facility]  [facility] -  Office  [phone]   See all Orders from today for further details    Objective:   Vitals:   10/20/18 0651 10/20/18 0800 10/20/18 0850 10/20/18 1123  BP:      Pulse: (!) 117 (!) 127 (!) 113   Resp: (!) 25 (!) 37 (!) 28   Temp:    98.3 F (36.8 C)  TempSrc:    Oral  SpO2: (!) 88% 95% 92%   Weight:      Height:        Wt Readings from Last 3 Encounters:  10/09/2018 72.6 kg     Intake/Output Summary (Last 24 hours) at 10/20/2018 1151 Last data filed at 10/20/2018 0800 Gross per 24 hour  Intake 250 ml  Output 750 ml  Net -500 ml     Physical Exam  Awake  Alert, Oriented X 3, No new F.N deficits, Normal affect [place].AT,PERRAL Supple Neck,No JVD, No cervical lymphadenopathy appriciated.  Symmetrical Chest wall movement, Good air movement bilaterally, CTAB RRR,No Gallops, Rubs or new Murmurs, No Parasternal Heave +ve B.Sounds, Abd Soft, No tenderness, No organomegaly appriciated, No rebound - guarding or rigidity. No Cyanosis, Clubbing or edema, No new Rash or bruise    Data Review:    CBC Recent Labs  Lab 10/14/18 0215 10/15/18 0200 10/16/18 0545 10/17/18 0145 10/18/18 0550 10/19/18 0347 10/20/18 0525  WBC 9.2 10.8* 10.0 11.3* 11.2* 13.2* 13.9*  HGB 13.6 13.9 14.8 15.2 15.7  15.8 16.0  HCT 41.0 41.7 45.9 45.9 46.5 46.8 47.9  PLT 247 294 332 309 335 349 337  MCV 88.7 88.3 90.7 91.4 89.9 88.6 89.2  MCH 29.4 29.4 29.2 30.3 30.4 29.9 29.8  MCHC 33.2 33.3 32.2 33.1 33.8 33.8 33.4  RDW 13.2 13.3 13.5 13.5 13.4 13.5 13.7  LYMPHSABS 0.9 0.9 1.1  --   --   --  1.7  MONOABS 0.3 0.8 0.8  --   --   --  0.4  EOSABS 0.0 0.0 0.0  --   --   --  0.2  BASOSABS 0.0 0.0 0.0  --   --   --  0.0    Chemistries  Recent Labs  Lab 10/14/18 0215  10/16/18 0545 10/17/18 0145 10/18/18 0550 10/19/18 0347 10/20/18 0525  NA 137   < > 137 135 133* 134* 133*  K 3.7   < > 4.0 3.9 3.5 4.6 3.8  CL 103   < > 98 97* 92* 95* 93*  CO2 24   < > 29 27 29 24 27   GLUCOSE 240*   < > 141* 139* 115* 81 99  BUN 28*   < > 35* 29* 33* 34* 40*  CREATININE 0.76   < > 0.74 0.71 0.89 0.76 0.72  CALCIUM 8.6*   < > 8.9 9.0 8.9 9.1 9.0  MG 2.1  --   --  2.2 2.4 2.4 2.4  AST 34   < > 44* 37 34 35 31  ALT 25   < > 37 37 41 36 32  ALKPHOS 61   < > 70 77 74 80 78  BILITOT 0.3   < > 0.8 0.5 0.6 0.5 0.6   < > = values in this interval not displayed.   ------------------------------------------------------------------------------------------------------------------ No results for input(s): CHOL, HDL, LDLCALC, TRIG, CHOLHDL, LDLDIRECT in the last 72 hours.  Lab Results  Component Value Date   HGBA1C 10.7 (H) 10/11/2018   ------------------------------------------------------------------------------------------------------------------ No results for input(s): TSH, T4TOTAL, T3FREE, THYROIDAB in the last 72 hours.  Invalid input(s): FREET3  Cardiac Enzymes No results for input(s): CKMB, TROPONINI, MYOGLOBIN in the last 168 hours.  Invalid input(s): CK ------------------------------------------------------------------------------------------------------------------    Component Value Date/Time   BNP 25.2 10/20/2018 0525    Micro Results Recent Results (from the past 240 hour(s))  SARS  Coronavirus 2 (CEPHEID- Performed in [facility] hospital lab), Hosp Order     Status: Abnormal   Collection Time: 10/01/2018  8:44 PM  Result Value Ref Range Status   SARS Coronavirus 2 POSITIVE (A) NEGATIVE Final    Comment: RESULT CALLED TO, READ BACK BY AND VERIFIED WITH: L CHILTON RN 10/09/2018 2232 JDW (NOTE) If result is NEGATIVE SARS-CoV-2 target nucleic acids are NOT DETECTED. The SARS-CoV-2 RNA is generally detectable in upper and lower  respiratory specimens during the acute phase of infection.  The lowest  concentration of SARS-CoV-2 viral copies this assay can detect is 250  copies / mL. A negative result does not preclude SARS-CoV-2 infection  and should not be used as the sole basis for treatment or other  patient management decisions.  A negative result may occur with  improper specimen collection / handling, submission of specimen other  than nasopharyngeal swab, presence of viral mutation(s) within the  areas targeted by this assay, and inadequate number of viral copies  (<250 copies / mL). A negative result must be combined with clinical  observations, patient history, and epidemiological information. If result is POSITIVE SARS-CoV-2 target nucleic acids are DETECTED. The SAR S-CoV-2 RNA is generally detectable in upper and lower  respiratory specimens during the acute phase of infection.  Positive  results are indicative of active infection with SARS-CoV-2.  Clinical  correlation with patient history and other diagnostic information is  necessary to determine patient infection status.  Positive results do  not rule out bacterial infection or co-infection with other viruses. If result is PRESUMPTIVE POSTIVE SARS-CoV-2 nucleic acids MAY BE PRESENT.   A presumptive positive result was obtained on the submitted specimen  and confirmed on repeat testing.  While 2019 novel coronavirus  (SARS-CoV-2) nucleic acids may be present in the submitted sample  additional confirmatory  testing may be necessary for epidemiological  and / or clinical management purposes  to differentiate between  SARS-CoV-2 and other Sarbecovirus currently known to infect humans.  If clinically indicated additional testing with an alternate test  methodology [phone]) is advis ed. The SARS-CoV-2 RNA is generally  detectable in upper and lower respiratory specimens during the acute  phase of infection. The expected result is Negative. Fact Sheet for Patients:  StrictlyIdeas.no Fact Sheet for Healthcare Providers: BankingDealers.co.za This test is not yet approved or cleared by the [place] FDA and has been authorized for detection and/or diagnosis of SARS-CoV-2 by FDA under an Emergency Use Authorization (EUA).  This EUA will remain in effect (meaning this test can be used) for the duration of the COVID-19 declaration under Section 564(b)(1) of the Act, 21 U.S.C. section 360bbb-3(b)(1), unless the authorization is terminated or revoked sooner. Performed at [facility], [place] [address], [place], [place] [address]   Culture, blood (Routine X 2) w Reflex to ID Panel     Status: None   Collection Time: 10/11/18  5:30 AM  Result Value Ref Range Status   Specimen Description BLOOD LEFT HAND  Final   Special Requests   Final    BOTTLES DRAWN AEROBIC AND ANAEROBIC Blood Culture adequate volume   Culture   Final    NO GROWTH 5 DAYS Performed at [facility], [place] [address], [place], [place] [address]    Report Status 10/16/2018 FINAL  Final  Culture, blood (Routine X 2) w Reflex to ID Panel     Status: None   Collection Time: 10/11/18  5:48 AM  Result Value Ref Range Status   Specimen Description BLOOD RIGHT HAND  Final   Special Requests   Final    BOTTLES DRAWN AEROBIC ONLY Blood Culture results may not be optimal due to an inadequate volume of blood received in culture bottles   Culture   Final    NO GROWTH 5  DAYS Performed at [facility], [place] [address] [place],  [address]    Report Status 10/16/2018 FINAL  Final    Radiology Reports Ct Angio Chest  Pe W And/or Wo Contrast  Result Date: 10/11/2018 CLINICAL DATA:  68 y/o  M; cough, fever, chills.  COVID-19 positive. EXAM: CT ANGIOGRAPHY CHEST WITH CONTRAST TECHNIQUE: Multidetector CT imaging of the chest was performed using the standard protocol during bolus administration of intravenous contrast. Multiplanar CT image reconstructions and MIPs were obtained to evaluate the vascular anatomy. CONTRAST:  115m OMNIPAQUE IOHEXOL 300 MG/ML  SOLN COMPARISON:  10/14/2018 chest radiograph FINDINGS: Cardiovascular: Mild respiratory motion artifact. Satisfactory opacification of the pulmonary arteries to the segmental level. No evidence of pulmonary embolism. Normal heart size. No pericardial effusion. Mild aortic and moderate coronary artery calcific atherosclerosis. Mediastinum/Nodes: No enlarged mediastinal, hilar, or axillary lymph nodes. Thyroid gland, trachea, and esophagus demonstrate no significant findings. Lungs/Pleura: Multiple peripheral ground-glass opacities with the largest confluent opacity in the right upper lobe. The right upper lobe demonstrates intra lobular septal thickening "crazy paving" pattern. No pleural effusion or pneumothorax. Upper Abdomen: No acute abnormality. Musculoskeletal: No chest wall abnormality. No acute or significant osseous findings. Review of the MIP images confirms the above findings. IMPRESSION: 1. Mild respiratory motion artifact. No pulmonary embolus identified. 2. There are a spectrum of findings in the lungs which can be seen with acute atypical infection (as well as other non-infectious etiologies). In particular, viral pneumonia (including COVID-19) should be considered in the appropriate clinical setting. Critical Value/emergent results were called by telephone at the time of interpretation on 10/11/2018 at  3:08 am to PA MCanovanas, who verbally acknowledged these results. Electronically Signed   By: LKristine GarbeM.D.   On: 10/11/2018 03:21   Dg Chest Port 1 View  Result Date: 10/19/2018 CLINICAL DATA:  Shortness of breath EXAM: PORTABLE CHEST 1 VIEW COMPARISON:  10/16/2018 FINDINGS: Multifocal airspace opacities, improved in the upper lungs bilaterally, persistent at the lung bases. This appearance favors improving multifocal pneumonia. No pleural effusion or pneumothorax. The heart is normal in size. IMPRESSION: Improving multifocal pneumonia, as above. Electronically Signed   By: SJulian HyM.D.   On: 10/19/2018 10:21   Dg Chest Port 1 View  Result Date: 10/16/2018 CLINICAL DATA:  Dyspnea.COVID19 EXAM: PORTABLE CHEST 1 VIEW COMPARISON:  10/12/2018 FINDINGS: Heart size appears normal. No pleural effusion identified. Diffuse hazy opacities are noted in the left lung. Airspace consolidation within the right upper lobe and left base is improved from previous exam. IMPRESSION: 1. Persistent left lung hazy opacities compatible with inflammation/infection. 2. Improving consolidation within the right upper lobe and left base. Electronically Signed   By: TKerby MoorsM.D.   On: 10/16/2018 09:15   Dg Chest Port 1 View  Result Date: 10/12/2018 CLINICAL DATA:  Weakness and fever, history of COVID-19 EXAM: PORTABLE CHEST 1 VIEW COMPARISON:  10/11/2018 FINDINGS: Cardiac shadow is stable. Patchy infiltrates are seen bilaterally particularly in the right upper lobe consistent with that seen on recent CT examination and consistent with the patient's given clinical history. No sizable effusion is seen. No bony abnormality is noted. Elevation the right hemidiaphragm is again noted. IMPRESSION: Patchy infiltrates predominately within the right upper lobe consistent with the given clinical history of COVID-19 positivity. Electronically Signed   By: MInez CatalinaM.D.   On: 10/12/2018 03:59   Dg Chest  Portable 1 View  Result Date: 09/29/2018 CLINICAL DATA:  Symptoms of possible COVID-19 EXAM: PORTABLE CHEST 1 VIEW COMPARISON:  None. FINDINGS: The heart size and mediastinal contours are within normal limits. Both lungs are clear. The visualized skeletal structures are unremarkable. IMPRESSION:  No active disease. Electronically Signed   By: Ulyses Jarred M.D.   On: 09/20/2018 22:55

## 2018-10-20 NOTE — Progress Notes (Signed)
Rt called due to desat.  RT increased FiO2 on last visit to help with comfort and rest due to pt dropping sats in sleep.  RT arrived and pt sat at 88-89% on 60% and 25LPM.  RT increased o's to 100% to help with recovery. Pt left here for approximately 5 mins.  Pt increased sat to 93% but dropped back to 90% fairly quickly.  RT reduced FiO2 back to 60% and sats dropped back to 86%. RT increased FiO2 back to 80% at this time and sats have increased to 89-90%. RT will continue to monitor.

## 2018-10-20 DEATH — deceased

## 2018-10-21 LAB — GLUCOSE, CAPILLARY
Glucose-Capillary: 117 mg/dL — ABNORMAL HIGH (ref 70–99)
Glucose-Capillary: 187 mg/dL — ABNORMAL HIGH (ref 70–99)
Glucose-Capillary: 157 mg/dL — ABNORMAL HIGH (ref 70–99)
Glucose-Capillary: 104 mg/dL — ABNORMAL HIGH (ref 70–99)

## 2018-10-21 LAB — COMPREHENSIVE METABOLIC PANEL
ALT: 26 U/L (ref 0–44)
AST: 33 U/L (ref 15–41)
Albumin: 3.2 g/dL — ABNORMAL LOW (ref 3.5–5.0)
Alkaline Phosphatase: 75 U/L (ref 38–126)
Anion gap: 13 (ref 5–15)
BUN: 31 mg/dL — ABNORMAL HIGH (ref 8–23)
CO2: 24 mmol/L (ref 22–32)
Calcium: 8.8 mg/dL — ABNORMAL LOW (ref 8.9–10.3)
Chloride: 93 mmol/L — ABNORMAL LOW (ref 98–111)
Creatinine, Ser: 0.65 mg/dL (ref 0.61–1.24)
GFR calc Af Amer: >60 mL/min (ref >60)
GFR calc non Af Amer: >60 mL/min (ref >60)
Glucose, Bld: 113 mg/dL — ABNORMAL HIGH (ref 70–99)
Potassium: 4 mmol/L (ref 3.5–5.1)
Sodium: 130 mmol/L — ABNORMAL LOW (ref 135–145)
Total Bilirubin: 1 mg/dL (ref 0.3–1.2)
Total Protein: 6.5 g/dL (ref 6.5–8.1)

## 2018-10-21 LAB — FERRITIN: Ferritin: 177 ng/mL (ref 24–336)

## 2018-10-21 LAB — OSMOLALITY: Osmolality: 281 mOsm/kg (ref 275–295)

## 2018-10-21 LAB — URIC ACID: Uric Acid, Serum: 2.8 mg/dL — ABNORMAL LOW (ref 3.7–8.6)

## 2018-10-21 LAB — CBC WITH DIFFERENTIAL/PLATELET
Abs Immature Granulocytes: 0.11 10*3/uL — ABNORMAL HIGH (ref 0.00–0.07)
Basophils Absolute: 0 10*3/uL (ref 0.0–0.1)
Basophils Relative: 0 %
Eosinophils Absolute: 0.1 10*3/uL (ref 0.0–0.5)
Eosinophils Relative: 0 %
HCT: 47.2 % (ref 39.0–52.0)
Hemoglobin: 15.9 g/dL (ref 13.0–17.0)
Immature Granulocytes: 1 %
Lymphocytes Relative: 14 %
Lymphs Abs: 2.3 10*3/uL (ref 0.7–4.0)
MCH: 30.2 pg (ref 26.0–34.0)
MCHC: 33.7 g/dL (ref 30.0–36.0)
MCV: 89.7 fL (ref 80.0–100.0)
Monocytes Absolute: 0.6 10*3/uL (ref 0.1–1.0)
Monocytes Relative: 4 %
Neutro Abs: 12.6 10*3/uL — ABNORMAL HIGH (ref 1.7–7.7)
Neutrophils Relative %: 81 %
Platelets: 334 10*3/uL (ref 150–400)
RBC: 5.26 MIL/uL (ref 4.22–5.81)
RDW: 13.5 % (ref 11.5–15.5)
WBC: 15.6 10*3/uL — ABNORMAL HIGH (ref 4.0–10.5)
nRBC: 0 % (ref 0.0–0.2)

## 2018-10-21 LAB — SODIUM, URINE, RANDOM: Sodium, Ur: <10 mmol/L

## 2018-10-21 LAB — MAGNESIUM: Magnesium: 2.4 mg/dL (ref 1.7–2.4)

## 2018-10-21 LAB — BRAIN NATRIURETIC PEPTIDE: B Natriuretic Peptide: 931.9 pg/mL — ABNORMAL HIGH (ref 0.0–100.0)

## 2018-10-21 LAB — PHOSPHORUS: Phosphorus: 3.9 mg/dL (ref 2.5–4.6)

## 2018-10-21 LAB — LACTATE DEHYDROGENASE: LDH: 423 U/L — ABNORMAL HIGH (ref 98–192)

## 2018-10-21 LAB — CREATININE, URINE, RANDOM: Creatinine, Urine: 128.98 mg/dL

## 2018-10-21 LAB — D-DIMER, QUANTITATIVE: D-Dimer, Quant: 1.84 ug/mL-FEU — ABNORMAL HIGH (ref 0.00–0.50)

## 2018-10-21 LAB — OSMOLALITY, URINE: Osmolality, Ur: 1016 mOsm/kg — ABNORMAL HIGH (ref 300–900)

## 2018-10-21 LAB — C-REACTIVE PROTEIN: CRP: <0.8 mg/dL (ref <1.0)

## 2018-10-21 MED ORDER — GUAIFENESIN-DM 100-10 MG/5ML PO SYRP
10.0000 mL | ORAL_SOLUTION | ORAL | Status: DC | PRN
  Administered 2018-10-21 – 2018-11-07 (×26): 10 mL via ORAL
  Filled 2018-10-21 (×29): qty 10

## 2018-10-21 MED ORDER — FUROSEMIDE 10 MG/ML IJ SOLN
40.0000 mg | Freq: Once | INTRAMUSCULAR | Status: AC
  Administered 2018-10-21: 13:00:00 40 mg via INTRAVENOUS
  Filled 2018-10-21: qty 4

## 2018-10-21 NOTE — Plan of Care (Signed)
Patient has had an uneventful shift. Attempted to wean patient down today, and he has been unable to sustain saturation over 90. MD aware. Patient has had little output this a.m. but has had increased output after administration of Lasix. Attempted to get patient to stand, but he requested to walk later this evening. His appetite has been fair, and patient has eaten between 50-75% of meals. He denies pain. All safety precautions maintained. Patient currently sitting in chair with eyes closed.

## 2018-10-21 NOTE — Progress Notes (Signed)
Patient remained in the chair throughout this shift. He did not want to lay in the bed.

## 2018-10-21 NOTE — Progress Notes (Signed)
PROGRESS NOTE                                                                                                                                                                                                             Patient Demographics:    Austin Page, is a 67 y.o. male, DOB - 1950-10-24, PQA:[id number]  Outpatient Primary MD for the patient is Patient, [name]    LOS - 10  Admit date - 10/17/2018    Chief Complaint  Patient presents with   Cough       Brief Narrative  67 year old male with no significant past medical history came into the hospital and was admitted on 09/22/2018 with 3 days of fever, chills, cough, vomiting and weakness in the setting of having a normal Covid contact.  In the ER he was febrile, tachycardic, tachypneic, underwent a CT angiogram which showed peripheral groundglass opacities.  He was Covid positive himself.  He had rapid deterioration of respiratory status requiring 15 L high flow nasal cannula and was transferred to the ICU on 5/25, he was transferred out of ICU on 10/19/2018 and transferred to my care on the same date.   Subjective:   Patient in bed, appears comfortable, denies any headache, no fever, no chest pain or pressure, improving shortness of breath , no abdominal pain. No focal weakness.   Assessment  & Plan :     1. Acute Hypoxic Resp. Failure due to Acute Covid 19 Viral Illness during the ongoing 2020 Covid 19 Pandemic -has received appropriate treatment which include IV steroids, 2 doses of Actemra along with convalescent plasma.  Clinically improving although still on high flow nasal cannula, was on 30 L early morning 10/21/2018 but I was able to titrate him down to 20 L and he stayed stable, I have instructed the nursing staff to continue titrating him down to see if he can con to 5 L nasal cannula oxygen in the next day or so.  Encouraged to sit up in chair use flutter  valve and I-S for pulmonary toiletry.  Inflammatory markers have stabilized we will continue to monitor closely.  Still  tenuous.    He is also getting intermittent IV Lasix as needed based on physical exam and BNP, he will require outpatient cardiology follow-up and echocardiogram.     FiO2 (%):  [55 %-100 %] 70 %    COVID-19 Labs  Recent Labs    10/19/18 0347 10/20/18 0525 10/21/18 0300  DDIMER 1.84* 1.80* 1.84*  FERRITIN 191 168 177  LDH  --  379* 423*  CRP 1.0* <0.8 <0.8    Lab Results  Component Value Date   SARSCOV2NAA POSITIVE (A) 10/06/2018        Component Value Date/Time   BNP 25.2 10/20/2018 0525    Treatment so far -Received Actemra x2 on 5/24 and 5/25 -Received convalescent plasma on 5/25 -continue Solu-Medrol for now -Remdesivir started on 5/24   Hyponatremia -  Does have intermittent rails but normal BNP, repeat Lasix, urine electrolytes, osmolality and sodium ordered along with serum osmolality and uric acid levels on 10/21/2018 to look for SIADH.  Hypokalemia - replaced and stable   Newly Diagnosed DM2 -poor outpatient control due to hyperglycemia, on Lantus + ISS, will provide DM and Insulin education.  Lab Results  Component Value Date   HGBA1C 10.7 (H) 10/11/2018   CBG (last 3)  Recent Labs    10/20/18 1539 10/20/18 2151 10/21/18 0725  GLUCAP 142* 172* 117*      Condition - Extremely Guarded  Family Communication  : None  Code Status :  Full  Diet : Regular  Disposition Plan  :  Home 3-4 days  Consults  :  None  Procedures  :  None  PUD Prophylaxis : None  DVT Prophylaxis  :  Lovenox   Lab Results  Component Value Date   PLT 334 10/21/2018    Inpatient Medications  Scheduled Meds:  chlorhexidine  15 mL Mouth Rinse BID   enoxaparin (LOVENOX) injection  40 mg Subcutaneous Q12H   furosemide  40 mg Intravenous Once   insulin aspart  0-15 Units Subcutaneous TID WC   insulin aspart  0-5 Units Subcutaneous QHS     insulin aspart  10 Units Subcutaneous TID WC   insulin glargine  20 Units Subcutaneous Daily   insulin glargine  5 Units Subcutaneous QHS   insulin starter kit- syringes  1 kit Other Once   living well with diabetes book   Does not apply Once   mouth rinse  15 mL Mouth Rinse q12n4p   methylPREDNISolone (SOLU-MEDROL) injection  30 mg Intravenous Daily   Continuous Infusions:  PRN Meds:.acetaminophen, guaiFENesin-dextromethorphan, HYDROcodone-acetaminophen, [DISCONTINUED] ondansetron **OR** ondansetron (ZOFRAN) IV, senna-docusate  Antibiotics  :    Anti-infectives (From admission, onward)   Start     Dose/Rate Route Frequency Ordered Stop   10/13/18 1400  remdesivir 100 mg in sodium chloride 0.9 % 230 mL IVPB     100 mg over 30 Minutes Intravenous Every 24 hours 10/12/18 1113 10/16/18 1930   10/12/18 1400  remdesivir 200 mg in sodium chloride 0.9 % 210 mL IVPB     200 mg over 30 Minutes Intravenous Once 10/12/18 1113 10/12/18 1749       Time Spent in minutes  Epworth M.D on 10/21/2018 at 11:29 AM  To page go to [email or web address] - password [facility]  [facility] -  Office  [phone]   See all Orders from today for further details    Objective:   Vitals:   10/21/18 0728 10/21/18 0800 10/21/18 0900 10/21/18 1046  BP:  136/86    Pulse:  (!) 104 (!) 113 (!) 111  Resp:  (!) 37 (!) 21 (!) 26  Temp: (!) 97.5 F (36.4 C)     TempSrc: Axillary     SpO2:  93% 91% 90%  Weight:      Height:        Wt Readings from Last 3 Encounters:  09/29/2018 72.6 kg     Intake/Output Summary (Last 24 hours) at 10/21/2018 1129 Last data filed at 10/21/2018 0728 Gross per 24 hour  Intake --  Output 850 ml  Net -850 ml     Physical Exam  Awake Alert, Oriented X 3, No new F.N deficits, Normal affect [place].AT,PERRAL Supple Neck,No JVD, No cervical lymphadenopathy appriciated.  Symmetrical Chest wall movement, Good air movement bilaterally, CTAB RRR,No Gallops, Rubs  or new Murmurs, No Parasternal Heave +ve B.Sounds, Abd Soft, No tenderness, No organomegaly appriciated, No rebound - guarding or rigidity. No Cyanosis, Clubbing or edema, No new Rash or bruise    Data Review:    CBC Recent Labs  Lab 10/15/18 0200 10/16/18 0545 10/17/18 0145 10/18/18 0550 10/19/18 0347 10/20/18 0525 10/21/18 0300  WBC 10.8* 10.0 11.3* 11.2* 13.2* 13.9* 15.6*  HGB 13.9 14.8 15.2 15.7 15.8 16.0 15.9  HCT 41.7 45.9 45.9 46.5 46.8 47.9 47.2  PLT 294 332 309 335 349 337 334  MCV 88.3 90.7 91.4 89.9 88.6 89.2 89.7  MCH 29.4 29.2 30.3 30.4 29.9 29.8 30.2  MCHC 33.3 32.2 33.1 33.8 33.8 33.4 33.7  RDW 13.3 13.5 13.5 13.4 13.5 13.7 13.5  LYMPHSABS 0.9 1.1  --   --   --  1.7 2.3  MONOABS 0.8 0.8  --   --   --  0.4 0.6  EOSABS 0.0 0.0  --   --   --  0.2 0.1  BASOSABS 0.0 0.0  --   --   --  0.0 0.0    Chemistries  Recent Labs  Lab 10/17/18 0145 10/18/18 0550 10/19/18 0347 10/20/18 0525 10/21/18 0300  NA 135 133* 134* 133* 130*  K 3.9 3.5 4.6 3.8 4.0  CL 97* 92* 95* 93* 93*  CO2 _0 GLUCOSE 139* 115* 81 99 113*  BUN 29* 33* 34* 40* 31*  CREATININE 0.71 0.89 0.76 0.72 0.65  CALCIUM 9.0 8.9 9.1 9.0 8.8*  MG 2.2 2.4 2.4 2.4 2.4  AST 37 34 35 31 33  ALT 37 41 36 32 26  ALKPHOS 77 74 80 78 75  BILITOT 0.5 0.6 0.5 0.6 1.0   ------------------------------------------------------------------------------------------------------------------ No results for input(s): CHOL, HDL, LDLCALC, TRIG, CHOLHDL, LDLDIRECT in the last 72 hours.  Lab Results  Component Value Date   HGBA1C 10.7 (H) 10/11/2018   ------------------------------------------------------------------------------------------------------------------ No results for input(s): TSH, T4TOTAL, T3FREE, THYROIDAB in the last 72 hours.  Invalid input(s): FREET3  Cardiac Enzymes No results for input(s): CKMB, TROPONINI, MYOGLOBIN in the last 168 hours.  Invalid input(s):  CK ------------------------------------------------------------------------------------------------------------------    Component Value Date/Time   BNP 25.2 10/20/2018 0525    Micro Results No results found for this or any previous visit (from the past 240 hour(s)).  Radiology Reports Ct Angio Chest Pe W And/or Wo Contrast  Result Date: 10/11/2018 CLINICAL DATA:  68 y/o  M; cough, fever, chills.  COVID-19 positive. EXAM: CT ANGIOGRAPHY CHEST WITH CONTRAST TECHNIQUE: Multidetector CT imaging of the chest was performed using the standard protocol during bolus administration of intravenous contrast. Multiplanar CT image  reconstructions and MIPs were obtained to evaluate the vascular anatomy. CONTRAST:  119m OMNIPAQUE IOHEXOL 300 MG/ML  SOLN COMPARISON:  09/20/2018 chest radiograph FINDINGS: Cardiovascular: Mild respiratory motion artifact. Satisfactory opacification of the pulmonary arteries to the segmental level. No evidence of pulmonary embolism. Normal heart size. No pericardial effusion. Mild aortic and moderate coronary artery calcific atherosclerosis. Mediastinum/Nodes: No enlarged mediastinal, hilar, or axillary lymph nodes. Thyroid gland, trachea, and esophagus demonstrate no significant findings. Lungs/Pleura: Multiple peripheral ground-glass opacities with the largest confluent opacity in the right upper lobe. The right upper lobe demonstrates intra lobular septal thickening "crazy paving" pattern. No pleural effusion or pneumothorax. Upper Abdomen: No acute abnormality. Musculoskeletal: No chest wall abnormality. No acute or significant osseous findings. Review of the MIP images confirms the above findings. IMPRESSION: 1. Mild respiratory motion artifact. No pulmonary embolus identified. 2. There are a spectrum of findings in the lungs which can be seen with acute atypical infection (as well as other non-infectious etiologies). In particular, viral pneumonia (including COVID-19) should be  considered in the appropriate clinical setting. Critical Value/emergent results were called by telephone at the time of interpretation on 10/11/2018 at 3:08 am to PA MHaslett, who verbally acknowledged these results. Electronically Signed   By: LKristine GarbeM.D.   On: 10/11/2018 03:21   Dg Chest Port 1 View  Result Date: 10/19/2018 CLINICAL DATA:  Shortness of breath EXAM: PORTABLE CHEST 1 VIEW COMPARISON:  10/16/2018 FINDINGS: Multifocal airspace opacities, improved in the upper lungs bilaterally, persistent at the lung bases. This appearance favors improving multifocal pneumonia. No pleural effusion or pneumothorax. The heart is normal in size. IMPRESSION: Improving multifocal pneumonia, as above. Electronically Signed   By: SJulian HyM.D.   On: 10/19/2018 10:21   Dg Chest Port 1 View  Result Date: 10/16/2018 CLINICAL DATA:  Dyspnea.COVID19 EXAM: PORTABLE CHEST 1 VIEW COMPARISON:  10/12/2018 FINDINGS: Heart size appears normal. No pleural effusion identified. Diffuse hazy opacities are noted in the left lung. Airspace consolidation within the right upper lobe and left base is improved from previous exam. IMPRESSION: 1. Persistent left lung hazy opacities compatible with inflammation/infection. 2. Improving consolidation within the right upper lobe and left base. Electronically Signed   By: TKerby MoorsM.D.   On: 10/16/2018 09:15   Dg Chest Port 1 View  Result Date: 10/12/2018 CLINICAL DATA:  Weakness and fever, history of COVID-19 EXAM: PORTABLE CHEST 1 VIEW COMPARISON:  10/11/2018 FINDINGS: Cardiac shadow is stable. Patchy infiltrates are seen bilaterally particularly in the right upper lobe consistent with that seen on recent CT examination and consistent with the patient's given clinical history. No sizable effusion is seen. No bony abnormality is noted. Elevation the right hemidiaphragm is again noted. IMPRESSION: Patchy infiltrates predominately within the right upper  lobe consistent with the given clinical history of COVID-19 positivity. Electronically Signed   By: MInez CatalinaM.D.   On: 10/12/2018 03:59   Dg Chest Portable 1 View  Result Date: 10/07/2018 CLINICAL DATA:  Symptoms of possible COVID-19 EXAM: PORTABLE CHEST 1 VIEW COMPARISON:  None. FINDINGS: The heart size and mediastinal contours are within normal limits. Both lungs are clear. The visualized skeletal structures are unremarkable. IMPRESSION: No active disease. Electronically Signed   By: KUlyses JarredM.D.   On: 10/04/2018 22:55

## 2018-10-21 NOTE — Progress Notes (Signed)
CardioVascular Research Department and AHF Team  ReDS Research Project   Patient #: [id number]  ReDS Measurement  Right: 42 %  Left: 40 %  

## 2018-10-21 NOTE — Progress Notes (Signed)
Updated medical status with daughter, no concerns noted

## 2018-10-22 LAB — GLUCOSE, CAPILLARY
Glucose-Capillary: 109 mg/dL — ABNORMAL HIGH (ref 70–99)
Glucose-Capillary: 177 mg/dL — ABNORMAL HIGH (ref 70–99)
Glucose-Capillary: 100 mg/dL — ABNORMAL HIGH (ref 70–99)
Glucose-Capillary: 158 mg/dL — ABNORMAL HIGH (ref 70–99)

## 2018-10-22 LAB — CBC WITH DIFFERENTIAL/PLATELET
Abs Immature Granulocytes: 0.11 10*3/uL — ABNORMAL HIGH (ref 0.00–0.07)
Basophils Absolute: 0 10*3/uL (ref 0.0–0.1)
Basophils Relative: 0 %
Eosinophils Absolute: 0.1 10*3/uL (ref 0.0–0.5)
Eosinophils Relative: 0 %
HCT: 46.5 % (ref 39.0–52.0)
Hemoglobin: 15.7 g/dL (ref 13.0–17.0)
Immature Granulocytes: 1 %
Lymphocytes Relative: 12 %
Lymphs Abs: 1.9 10*3/uL (ref 0.7–4.0)
MCH: 30 pg (ref 26.0–34.0)
MCHC: 33.8 g/dL (ref 30.0–36.0)
MCV: 88.7 fL (ref 80.0–100.0)
Monocytes Absolute: 0.6 10*3/uL (ref 0.1–1.0)
Monocytes Relative: 4 %
Neutro Abs: 12.9 10*3/uL — ABNORMAL HIGH (ref 1.7–7.7)
Neutrophils Relative %: 83 %
Platelets: 300 10*3/uL (ref 150–400)
RBC: 5.24 MIL/uL (ref 4.22–5.81)
RDW: 13.5 % (ref 11.5–15.5)
WBC: 15.6 10*3/uL — ABNORMAL HIGH (ref 4.0–10.5)
nRBC: 0 % (ref 0.0–0.2)

## 2018-10-22 LAB — COMPREHENSIVE METABOLIC PANEL
ALT: 25 U/L (ref 0–44)
AST: 34 U/L (ref 15–41)
Albumin: 3.4 g/dL — ABNORMAL LOW (ref 3.5–5.0)
Alkaline Phosphatase: 73 U/L (ref 38–126)
Anion gap: 16 — ABNORMAL HIGH (ref 5–15)
BUN: 35 mg/dL — ABNORMAL HIGH (ref 8–23)
CO2: 28 mmol/L (ref 22–32)
Calcium: 8.8 mg/dL — ABNORMAL LOW (ref 8.9–10.3)
Chloride: 88 mmol/L — ABNORMAL LOW (ref 98–111)
Creatinine, Ser: 0.67 mg/dL (ref 0.61–1.24)
GFR calc Af Amer: >60 mL/min (ref >60)
GFR calc non Af Amer: >60 mL/min (ref >60)
Glucose, Bld: 31 mg/dL — CL (ref 70–99)
Potassium: 3.3 mmol/L — ABNORMAL LOW (ref 3.5–5.1)
Sodium: 132 mmol/L — ABNORMAL LOW (ref 135–145)
Total Bilirubin: 0.7 mg/dL (ref 0.3–1.2)
Total Protein: 6.7 g/dL (ref 6.5–8.1)

## 2018-10-22 LAB — FERRITIN: Ferritin: 249 ng/mL (ref 24–336)

## 2018-10-22 LAB — MAGNESIUM: Magnesium: 2.5 mg/dL — ABNORMAL HIGH (ref 1.7–2.4)

## 2018-10-22 LAB — D-DIMER, QUANTITATIVE: D-Dimer, Quant: 1.75 ug/mL-FEU — ABNORMAL HIGH (ref 0.00–0.50)

## 2018-10-22 LAB — C-REACTIVE PROTEIN: CRP: <0.8 mg/dL (ref <1.0)

## 2018-10-22 LAB — LACTATE DEHYDROGENASE: LDH: 452 U/L — ABNORMAL HIGH (ref 98–192)

## 2018-10-22 NOTE — Progress Notes (Addendum)
PROGRESS NOTE                                                                                                                                                                                                             Patient Demographics:    Austin Page, is a 68 y.o. male, DOB - [birth year], EXH:[id number]  Outpatient Primary MD for the patient is Patient, [name]    LOS - 11  Admit date - 10/09/2018    Chief Complaint  Patient presents with   Cough       Brief Narrative  68 year old male with no significant past medical history came into the hospital and was admitted on 10/16/2018 with 3 days of fever, chills, cough, vomiting and weakness in the setting of having a normal Covid contact.  In the ER he was febrile, tachycardic, tachypneic, underwent a CT angiogram which showed peripheral groundglass opacities.  He was Covid positive himself.  He had rapid deterioration of respiratory status requiring 15 L high flow nasal cannula and was transferred to the ICU on 5/25, he was transferred out of ICU on 10/19/2018 and transferred to my care on the same date.   Subjective:   Appears comfortable.  Denies any worsening shortness of breath.  He has a nonproductive cough.   Assessment  & Plan :     1. Acute Hypoxic Resp. Failure due to Acute Covid 19 Viral Illness during the ongoing 2020 Covid 19 Pandemic -has received appropriate treatment which include IV steroids, 2 doses of Actemra along with convalescent plasma.  Clinically improving although still on high flow nasal cannula, was on 30 L early morning 10/21/2018, but currently has been weaned down to 10 L, I have instructed the nursing staff to continue titrating him down to see if he can con to 5 L nasal cannula oxygen in the next day or so.  Encouraged to sit up in chair use flutter valve and I-S for pulmonary toiletry.  Inflammatory markers have stabilized we will continue  to monitor closely.  Still tenuous.    He is also getting intermittent IV Lasix as needed based on physical exam and BNP, he will require outpatient cardiology follow-up and echocardiogram.          COVID-19 Labs  Recent Labs    10/20/18 0525 10/21/18 0300 10/22/18 0320  DDIMER 1.80* 1.84* 1.75*  FERRITIN 168 177 249  LDH 379* 423* 452*  CRP <0.8 <0.8 <0.8    Lab Results  Component Value Date   SARSCOV2NAA POSITIVE (A) 09/20/2018        Component Value Date/Time   BNP 931.9 (H) 10/21/2018 0925    Treatment so far -Received Actemra x2 on 5/24 and 5/25 -Received convalescent plasma on 5/25 -continue Solu-Medrol for now -Remdesivir started on 5/24   Hyponatremia -clinically, he appears to be euvolemic.  Urine osmolality is elevated.  Uric acid is low.  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  He has been receiving intermittent doses of Lasix.  Will place on fluid restriction.  Hypokalemia - replaced and stable   Newly Diagnosed DM2 -poor outpatient control due to hyperglycemia, on Lantus + ISS, will provide DM and Insulin education.  Lab Results  Component Value Date   HGBA1C 10.7 (H) 10/11/2018   CBG (last 3)  Recent Labs    10/21/18 2123 10/22/18 0744 10/22/18 1125  GLUCAP 104* 109* 177*      Condition - Extremely Guarded  Family Communication  : updated daughter over the phone 6/3  Code Status :  Full  Diet : Regular  Disposition Plan  :  Home 3-4 days  Consults  :  None  Procedures  :  None  PUD Prophylaxis : None  DVT Prophylaxis  :  Lovenox   Lab Results  Component Value Date   PLT 300 10/22/2018    Inpatient Medications  Scheduled Meds:  chlorhexidine  15 mL Mouth Rinse BID   enoxaparin (LOVENOX) injection  40 mg Subcutaneous Q12H   insulin aspart  0-15 Units Subcutaneous TID WC   insulin aspart  0-5 Units Subcutaneous QHS   insulin aspart  10 Units Subcutaneous TID WC   insulin glargine  20 Units Subcutaneous Daily    insulin glargine  5 Units Subcutaneous QHS   insulin starter kit- syringes  1 kit Other Once   living well with diabetes book   Does not apply Once   mouth rinse  15 mL Mouth Rinse q12n4p   methylPREDNISolone (SOLU-MEDROL) injection  30 mg Intravenous Daily   Continuous Infusions:  PRN Meds:.acetaminophen, guaiFENesin-dextromethorphan, HYDROcodone-acetaminophen, [DISCONTINUED] ondansetron **OR** ondansetron (ZOFRAN) IV, senna-docusate  Antibiotics  :    Anti-infectives (From admission, onward)   Start     Dose/Rate Route Frequency Ordered Stop   10/13/18 1400  remdesivir 100 mg in sodium chloride 0.9 % 230 mL IVPB     100 mg over 30 Minutes Intravenous Every 24 hours 10/12/18 1113 10/16/18 1930   10/12/18 1400  remdesivir 200 mg in sodium chloride 0.9 % 210 mL IVPB     200 mg over 30 Minutes Intravenous Once 10/12/18 1113 10/12/18 1749       Time Spent in minutes  30   Kathie Dike M.D on 10/22/2018 at 2:55 PM  To page go to [email or web address] - password [facility]  [facility] -  Office  [phone]   See all Orders from today for further details    Objective:   Vitals:   10/22/18 0549 10/22/18 0746 10/22/18 0800 10/22/18 1200  BP: (!) 149/83  138/84 (!) 141/91  Pulse:   90 (!) 107  Resp:   (!) 25 (!) 25  Temp: 98 F (36.7 C) 97.8 F (36.6 C)    TempSrc: Oral Oral    SpO2:   94% 95%  Weight:      Height:  Wt Readings from Last 3 Encounters:  09/29/2018 72.6 kg     Intake/Output Summary (Last 24 hours) at 10/22/2018 1455 Last data filed at 10/21/2018 2149 Gross per 24 hour  Intake --  Output 675 ml  Net -675 ml     Physical Exam  General exam: Alert, awake, oriented x 3 Respiratory system: Fine crackles bilaterally. Respiratory effort normal. Cardiovascular system:RRR. No murmurs, rubs, gallops. Gastrointestinal system: Abdomen is nondistended, soft and nontender. No organomegaly or masses felt. Normal bowel sounds heard. Central nervous  system: Alert and oriented. No focal neurological deficits. Extremities: No C/C/E, +pedal pulses Skin: No rashes, lesions or ulcers Psychiatry: Judgement and insight appear normal. Mood & affect appropriate.      Data Review:    CBC Recent Labs  Lab 10/16/18 0545  10/18/18 0550 10/19/18 0347 10/20/18 0525 10/21/18 0300 10/22/18 0320  WBC 10.0   < > 11.2* 13.2* 13.9* 15.6* 15.6*  HGB 14.8   < > 15.7 15.8 16.0 15.9 15.7  HCT 45.9   < > 46.5 46.8 47.9 47.2 46.5  PLT 332   < > 335 349 337 334 300  MCV 90.7   < > 89.9 88.6 89.2 89.7 88.7  MCH 29.2   < > 30.4 29.9 29.8 30.2 30.0  MCHC 32.2   < > 33.8 33.8 33.4 33.7 33.8  RDW 13.5   < > 13.4 13.5 13.7 13.5 13.5  LYMPHSABS 1.1  --   --   --  1.7 2.3 1.9  MONOABS 0.8  --   --   --  0.4 0.6 0.6  EOSABS 0.0  --   --   --  0.2 0.1 0.1  BASOSABS 0.0  --   --   --  0.0 0.0 0.0   < > = values in this interval not displayed.    Chemistries  Recent Labs  Lab 10/18/18 0550 10/19/18 0347 10/20/18 0525 10/21/18 0300 10/22/18 0320  NA 133* 134* 133* 130* 132*  K 3.5 4.6 3.8 4.0 3.3*  CL 92* 95* 93* 93* 88*  CO2 29 24 27 24 28   GLUCOSE 115* 81 99 113* 31*  BUN 33* 34* 40* 31* 35*  CREATININE 0.89 0.76 0.72 0.65 0.67  CALCIUM 8.9 9.1 9.0 8.8* 8.8*  MG 2.4 2.4 2.4 2.4 2.5*  AST 34 35 31 33 34  ALT 41 36 32 26 25  ALKPHOS 74 80 78 75 73  BILITOT 0.6 0.5 0.6 1.0 0.7   ------------------------------------------------------------------------------------------------------------------ No results for input(s): CHOL, HDL, LDLCALC, TRIG, CHOLHDL, LDLDIRECT in the last 72 hours.  Lab Results  Component Value Date   HGBA1C 10.7 (H) 10/11/2018   ------------------------------------------------------------------------------------------------------------------ No results for input(s): TSH, T4TOTAL, T3FREE, THYROIDAB in the last 72 hours.  Invalid input(s): FREET3  Cardiac Enzymes No results for input(s): CKMB, TROPONINI, MYOGLOBIN in  the last 168 hours.  Invalid input(s): CK ------------------------------------------------------------------------------------------------------------------    Component Value Date/Time   BNP 931.9 (H) 10/21/2018 7282    Micro Results No results found for this or any previous visit (from the past 240 hour(s)).  Radiology Reports Ct Angio Chest Pe W And/or Wo Contrast  Result Date: 10/11/2018 CLINICAL DATA:  68 y/o  M; cough, fever, chills.  COVID-19 positive. EXAM: CT ANGIOGRAPHY CHEST WITH CONTRAST TECHNIQUE: Multidetector CT imaging of the chest was performed using the standard protocol during bolus administration of intravenous contrast. Multiplanar CT image reconstructions and MIPs were obtained to evaluate the vascular anatomy. CONTRAST:  165m OMNIPAQUE IOHEXOL  300 MG/ML  SOLN COMPARISON:  09/27/2018 chest radiograph FINDINGS: Cardiovascular: Mild respiratory motion artifact. Satisfactory opacification of the pulmonary arteries to the segmental level. No evidence of pulmonary embolism. Normal heart size. No pericardial effusion. Mild aortic and moderate coronary artery calcific atherosclerosis. Mediastinum/Nodes: No enlarged mediastinal, hilar, or axillary lymph nodes. Thyroid gland, trachea, and esophagus demonstrate no significant findings. Lungs/Pleura: Multiple peripheral ground-glass opacities with the largest confluent opacity in the right upper lobe. The right upper lobe demonstrates intra lobular septal thickening "crazy paving" pattern. No pleural effusion or pneumothorax. Upper Abdomen: No acute abnormality. Musculoskeletal: No chest wall abnormality. No acute or significant osseous findings. Review of the MIP images confirms the above findings. IMPRESSION: 1. Mild respiratory motion artifact. No pulmonary embolus identified. 2. There are a spectrum of findings in the lungs which can be seen with acute atypical infection (as well as other non-infectious etiologies). In particular,  viral pneumonia (including COVID-19) should be considered in the appropriate clinical setting. Critical Value/emergent results were called by telephone at the time of interpretation on 10/11/2018 at 3:08 am to PA [place] , who verbally acknowledged these results. Electronically Signed   By: Kristine Garbe M.D.   On: 10/11/2018 03:21   Dg Chest Port 1 View  Result Date: 10/19/2018 CLINICAL DATA:  Shortness of breath EXAM: PORTABLE CHEST 1 VIEW COMPARISON:  10/16/2018 FINDINGS: Multifocal airspace opacities, improved in the upper lungs bilaterally, persistent at the lung bases. This appearance favors improving multifocal pneumonia. No pleural effusion or pneumothorax. The heart is normal in size. IMPRESSION: Improving multifocal pneumonia, as above. Electronically Signed   By: Julian Hy M.D.   On: 10/19/2018 10:21   Dg Chest Port 1 View  Result Date: 10/16/2018 CLINICAL DATA:  Dyspnea.COVID19 EXAM: PORTABLE CHEST 1 VIEW COMPARISON:  10/12/2018 FINDINGS: Heart size appears normal. No pleural effusion identified. Diffuse hazy opacities are noted in the left lung. Airspace consolidation within the right upper lobe and left base is improved from previous exam. IMPRESSION: 1. Persistent left lung hazy opacities compatible with inflammation/infection. 2. Improving consolidation within the right upper lobe and left base. Electronically Signed   By: Kerby Moors M.D.   On: 10/16/2018 09:15   Dg Chest Port 1 View  Result Date: 10/12/2018 CLINICAL DATA:  Weakness and fever, history of COVID-19 EXAM: PORTABLE CHEST 1 VIEW COMPARISON:  10/11/2018 FINDINGS: Cardiac shadow is stable. Patchy infiltrates are seen bilaterally particularly in the right upper lobe consistent with that seen on recent CT examination and consistent with the patient's given clinical history. No sizable effusion is seen. No bony abnormality is noted. Elevation the right hemidiaphragm is again noted. IMPRESSION: Patchy  infiltrates predominately within the right upper lobe consistent with the given clinical history of COVID-19 positivity. Electronically Signed   By: Inez Catalina M.D.   On: 10/12/2018 03:59   Dg Chest Portable 1 View  Result Date: 09/29/2018 CLINICAL DATA:  Symptoms of possible COVID-19 EXAM: PORTABLE CHEST 1 VIEW COMPARISON:  None. FINDINGS: The heart size and mediastinal contours are within normal limits. Both lungs are clear. The visualized skeletal structures are unremarkable. IMPRESSION: No active disease. Electronically Signed   By: Ulyses Jarred M.D.   On: 09/27/2018 22:55

## 2018-10-22 NOTE — Progress Notes (Signed)
[facility] and [facility] Team  ReDS Research Project   Patient #: [id number]  ReDS Measurement  Right: 47%  Left: 45%

## 2018-10-22 NOTE — Progress Notes (Signed)
Unable to wean from 8L(Mid 80's on 6L) Refused to ambulated in room, requested to sit in chair entire night . Resting quietly at present

## 2018-10-23 LAB — CBC WITH DIFFERENTIAL/PLATELET
Abs Immature Granulocytes: 0.12 10*3/uL — ABNORMAL HIGH (ref 0.00–0.07)
Basophils Absolute: 0 10*3/uL (ref 0.0–0.1)
Basophils Relative: 0 %
Eosinophils Absolute: 0.1 10*3/uL (ref 0.0–0.5)
Eosinophils Relative: 0 %
HCT: 47.2 % (ref 39.0–52.0)
Hemoglobin: 15.8 g/dL (ref 13.0–17.0)
Immature Granulocytes: 1 %
Lymphocytes Relative: 16 %
Lymphs Abs: 2.7 10*3/uL (ref 0.7–4.0)
MCH: 29.5 pg (ref 26.0–34.0)
MCHC: 33.5 g/dL (ref 30.0–36.0)
MCV: 88.2 fL (ref 80.0–100.0)
Monocytes Absolute: 0.7 10*3/uL (ref 0.1–1.0)
Monocytes Relative: 4 %
Neutro Abs: 13.8 10*3/uL — ABNORMAL HIGH (ref 1.7–7.7)
Neutrophils Relative %: 79 %
Platelets: 326 10*3/uL (ref 150–400)
RBC: 5.35 MIL/uL (ref 4.22–5.81)
RDW: 13.2 % (ref 11.5–15.5)
WBC: 17.4 10*3/uL — ABNORMAL HIGH (ref 4.0–10.5)
nRBC: 0 % (ref 0.0–0.2)

## 2018-10-23 LAB — GLUCOSE, CAPILLARY
Glucose-Capillary: 85 mg/dL (ref 70–99)
Glucose-Capillary: 313 mg/dL — ABNORMAL HIGH (ref 70–99)
Glucose-Capillary: 95 mg/dL (ref 70–99)
Glucose-Capillary: 178 mg/dL — ABNORMAL HIGH (ref 70–99)

## 2018-10-23 LAB — COMPREHENSIVE METABOLIC PANEL
ALT: 27 U/L (ref 0–44)
AST: 35 U/L (ref 15–41)
Albumin: 3.4 g/dL — ABNORMAL LOW (ref 3.5–5.0)
Alkaline Phosphatase: 77 U/L (ref 38–126)
Anion gap: 14 (ref 5–15)
BUN: 27 mg/dL — ABNORMAL HIGH (ref 8–23)
CO2: 28 mmol/L (ref 22–32)
Calcium: 8.9 mg/dL (ref 8.9–10.3)
Chloride: 89 mmol/L — ABNORMAL LOW (ref 98–111)
Creatinine, Ser: 0.66 mg/dL (ref 0.61–1.24)
GFR calc Af Amer: >60 mL/min (ref >60)
GFR calc non Af Amer: >60 mL/min (ref >60)
Glucose, Bld: 72 mg/dL (ref 70–99)
Potassium: 3.8 mmol/L (ref 3.5–5.1)
Sodium: 131 mmol/L — ABNORMAL LOW (ref 135–145)
Total Bilirubin: 0.8 mg/dL (ref 0.3–1.2)
Total Protein: 6.7 g/dL (ref 6.5–8.1)

## 2018-10-23 LAB — LACTATE DEHYDROGENASE: LDH: 460 U/L — ABNORMAL HIGH (ref 98–192)

## 2018-10-23 LAB — FERRITIN: Ferritin: 256 ng/mL (ref 24–336)

## 2018-10-23 LAB — D-DIMER, QUANTITATIVE: D-Dimer, Quant: 2.08 ug/mL-FEU — ABNORMAL HIGH (ref 0.00–0.50)

## 2018-10-23 LAB — C-REACTIVE PROTEIN: CRP: <0.8 mg/dL (ref <1.0)

## 2018-10-23 LAB — MAGNESIUM: Magnesium: 2.6 mg/dL — ABNORMAL HIGH (ref 1.7–2.4)

## 2018-10-23 MED ORDER — GLUCERNA SHAKE PO LIQD
237.0000 mL | Freq: Two times a day (BID) | ORAL | Status: DC
  Administered 2018-10-23 – 2018-11-02 (×17): 237 mL via ORAL
  Filled 2018-10-23 (×24): qty 237

## 2018-10-23 MED ORDER — METOPROLOL TARTRATE 25 MG PO TABS
25.0000 mg | ORAL_TABLET | Freq: Two times a day (BID) | ORAL | Status: DC
  Administered 2018-10-23 – 2018-10-28 (×11): 25 mg via ORAL
  Filled 2018-10-23 (×11): qty 1

## 2018-10-23 MED ORDER — INSULIN GLARGINE 100 UNIT/ML ~~LOC~~ SOLN
15.0000 [IU] | Freq: Every day | SUBCUTANEOUS | Status: DC
  Administered 2018-10-24 – 2018-10-31 (×8): 15 [IU] via SUBCUTANEOUS
  Filled 2018-10-23 (×9): qty 0.15

## 2018-10-23 MED ORDER — SALINE SPRAY 0.65 % NA SOLN
1.0000 | NASAL | Status: DC | PRN
  Administered 2018-11-03 – 2018-11-06 (×3): 1 via NASAL
  Filled 2018-10-23: qty 44

## 2018-10-23 MED ORDER — FUROSEMIDE 10 MG/ML IJ SOLN
40.0000 mg | Freq: Once | INTRAMUSCULAR | Status: AC
  Administered 2018-10-23: 40 mg via INTRAVENOUS
  Filled 2018-10-23: qty 4

## 2018-10-23 MED ORDER — ALUM & MAG HYDROXIDE-SIMETH 200-200-20 MG/5ML PO SUSP: 15.0000 mL | ORAL | Status: DC | PRN

## 2018-10-23 MED ORDER — INSULIN ASPART 100 UNIT/ML ~~LOC~~ SOLN
6.0000 [IU] | Freq: Three times a day (TID) | SUBCUTANEOUS | Status: DC
  Administered 2018-10-23 – 2018-11-02 (×28): 6 [IU] via SUBCUTANEOUS

## 2018-10-23 NOTE — Progress Notes (Signed)
PROGRESS NOTE                                                                                                                                                                                                             Patient Demographics:    Austin Page, is a 68 y.o. male, DOB - 07/15/1950, MVH:[id number]  Outpatient Primary MD for the patient is Patient, [name]    LOS - 12  Admit date - 10/12/2018    Chief Complaint  Patient presents with   Cough       Brief Narrative  68 year old male with no significant past medical history came into the hospital and was admitted on 09/30/2018 with 3 days of fever, chills, cough, vomiting and weakness in the setting of having a normal Covid contact.  In the ER he was febrile, tachycardic, tachypneic, underwent a CT angiogram which showed peripheral groundglass opacities.  He was Covid positive himself.  He had rapid deterioration of respiratory status requiring 15 L high flow nasal cannula and was transferred to the ICU on 5/25, he was transferred out of ICU on 10/19/2018   Subjective:   Currently on 12 L of oxygen.  Says that he is comfortable and does not feel short of breath.  Sitting up in chair.  Continues to have nonproductive cough.   Assessment  & Plan :     1. Acute Hypoxic Resp. Failure due to Acute Covid 19 Viral Illness during the ongoing 2020 Covid 19 Pandemic -has received appropriate treatment which include IV steroids, 2 doses of Actemra along with convalescent plasma.  Clinically improving although still on high flow nasal cannula, was on 30 L early morning 10/21/2018, but currently has been weaned down to 10 L, I have instructed the nursing staff to continue titrating him down to see if he can con to 5 L nasal cannula oxygen in the next day or so.  Encouraged to sit up in chair use flutter valve and I-S for pulmonary toiletry.  Encouraged prone position breathing.   Inflammatory markers have stabilized we will continue to monitor closely.  Still tenuous.    He is  also getting intermittent IV Lasix as needed based on physical exam and BNP, he will require outpatient cardiology follow-up and echocardiogram.          COVID-19 Labs  Recent Labs    10/21/18 0300 10/22/18 0320 10/23/18 0330  DDIMER 1.84* 1.75* 2.08*  FERRITIN 177 249 256  LDH 423* 452* 460*  CRP <0.8 <0.8 <0.8    Lab Results  Component Value Date   SARSCOV2NAA POSITIVE (A) 09/30/2018        Component Value Date/Time   BNP 931.9 (H) 10/21/2018 0925    Treatment so far -Received Actemra x2 on 5/24 and 5/25 -Received convalescent plasma on 5/25 -continue Solu-Medrol for now -Remdesivir started on 5/24   Hyponatremia -clinically, he appears to be euvolemic.  Urine osmolality is elevated.  Uric acid is low.  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  He has been receiving intermittent doses of Lasix.  We will give another dose today  Hypokalemia - replaced and stable   Newly Diagnosed DM2 -poor outpatient control due to hyperglycemia, on Lantus + ISS, will provide DM and Insulin education.  Blood sugars remain elevated, will further adjust insulin.  Lab Results  Component Value Date   HGBA1C 10.7 (H) 10/11/2018   CBG (last 3)  Recent Labs    10/22/18 2224 10/23/18 0759 10/23/18 1240  GLUCAP 158* 85 313*      Condition - Extremely Guarded  Family Communication  : updated daughter over the phone 6/3  Code Status :  Full  Diet : Regular  Disposition Plan  :  Home 3-4 days  Consults  :  None  Procedures  :  None  PUD Prophylaxis : None  DVT Prophylaxis  :  Lovenox   Lab Results  Component Value Date   PLT 326 10/23/2018    Inpatient Medications  Scheduled Meds:  chlorhexidine  15 mL Mouth Rinse BID   enoxaparin (LOVENOX) injection  40 mg Subcutaneous Q12H   insulin aspart  0-15 Units Subcutaneous TID WC   insulin aspart  0-5 Units  Subcutaneous QHS   insulin aspart  6 Units Subcutaneous TID WC   [START ON 10/24/2018] insulin glargine  15 Units Subcutaneous Daily   insulin glargine  5 Units Subcutaneous QHS   insulin starter kit- syringes  1 kit Other Once   living well with diabetes book   Does not apply Once   mouth rinse  15 mL Mouth Rinse q12n4p   methylPREDNISolone (SOLU-MEDROL) injection  30 mg Intravenous Daily   metoprolol tartrate  25 mg Oral BID   Continuous Infusions:  PRN Meds:.acetaminophen, alum & mag hydroxide-simeth, guaiFENesin-dextromethorphan, HYDROcodone-acetaminophen, [DISCONTINUED] ondansetron **OR** ondansetron (ZOFRAN) IV, senna-docusate, sodium chloride  Antibiotics  :    Anti-infectives (From admission, onward)   Start     Dose/Rate Route Frequency Ordered Stop   10/13/18 1400  remdesivir 100 mg in sodium chloride 0.9 % 230 mL IVPB     100 mg over 30 Minutes Intravenous Every 24 hours 10/12/18 1113 10/16/18 1930   10/12/18 1400  remdesivir 200 mg in sodium chloride 0.9 % 210 mL IVPB     200 mg over 30 Minutes Intravenous Once 10/12/18 1113 10/12/18 1749       Time Spent in minutes  30   Kathie Dike M.D on 10/23/2018 at 2:55 PM  To page go to [email or web address] - password [email or web address]  [facility] -  Office  [phone]   See all Orders from today for further details  Objective:   Vitals:   10/22/18 2016 10/23/18 0000 10/23/18 0800 10/23/18 0801  BP: (!) 145/98 (!) 143/81 (!) 147/77   Pulse: (!) 111 97 (!) 109   Resp: 18 (!) 33 (!) 28   Temp: 97.6 F (36.4 C) 97.7 F (36.5 C)  (!) 96.3 F (35.7 C)  TempSrc: Oral Oral  Axillary  SpO2: 91% 96% 90%   Weight:      Height:        Wt Readings from Last 3 Encounters:  10/09/2018 72.6 kg     Intake/Output Summary (Last 24 hours) at 10/23/2018 1455 Last data filed at 10/23/2018 1056 Gross per 24 hour  Intake 120 ml  Output 1175 ml  Net -1055 ml     Physical Exam  General exam: Alert, awake, oriented x  3 Respiratory system: fine crackles at bases. Respiratory effort normal. Cardiovascular system:RRR. No murmurs, rubs, gallops. Gastrointestinal system: Abdomen is nondistended, soft and nontender. No organomegaly or masses felt. Normal bowel sounds heard. Central nervous system: Alert and oriented. No focal neurological deficits. Extremities: No C/C/E, +pedal pulses Skin: No rashes, lesions or ulcers Psychiatry: Judgement and insight appear normal. Mood & affect appropriate.        Data Review:    CBC Recent Labs  Lab 10/19/18 0347 10/20/18 0525 10/21/18 0300 10/22/18 0320 10/23/18 0330  WBC 13.2* 13.9* 15.6* 15.6* 17.4*  HGB 15.8 16.0 15.9 15.7 15.8  HCT 46.8 47.9 47.2 46.5 47.2  PLT 349 337 334 300 326  MCV 88.6 89.2 89.7 88.7 88.2  MCH 29.9 29.8 30.2 30.0 29.5  MCHC 33.8 33.4 33.7 33.8 33.5  RDW 13.5 13.7 13.5 13.5 13.2  LYMPHSABS  --  1.7 2.3 1.9 2.7  MONOABS  --  0.4 0.6 0.6 0.7  EOSABS  --  0.2 0.1 0.1 0.1  BASOSABS  --  0.0 0.0 0.0 0.0    Chemistries  Recent Labs  Lab 10/19/18 0347 10/20/18 0525 10/21/18 0300 10/22/18 0320 10/23/18 0330  NA 134* 133* 130* 132* 131*  K 4.6 3.8 4.0 3.3* 3.8  CL 95* 93* 93* 88* 89*  CO2 _0 GLUCOSE 81 99 113* 31* 72  BUN 34* 40* 31* 35* 27*  CREATININE 0.76 0.72 0.65 0.67 0.66  CALCIUM 9.1 9.0 8.8* 8.8* 8.9  MG 2.4 2.4 2.4 2.5* 2.6*  AST 35 31 33 34 35  ALT 36 32 _1 ALKPHOS 80 78 75 73 77  BILITOT 0.5 0.6 1.0 0.7 0.8   ------------------------------------------------------------------------------------------------------------------ No results for input(s): CHOL, HDL, LDLCALC, TRIG, CHOLHDL, LDLDIRECT in the last 72 hours.  Lab Results  Component Value Date   HGBA1C 10.7 (H) 10/11/2018   ------------------------------------------------------------------------------------------------------------------ No results for input(s): TSH, T4TOTAL, T3FREE, THYROIDAB in the last 72 hours.  Invalid  input(s): FREET3  Cardiac Enzymes No results for input(s): CKMB, TROPONINI, MYOGLOBIN in the last 168 hours.  Invalid input(s): CK ------------------------------------------------------------------------------------------------------------------    Component Value Date/Time   BNP 931.9 (H) 10/21/2018 2500    Micro Results No results found for this or any previous visit (from the past 240 hour(s)).  Radiology Reports Ct Angio Chest Pe W And/or Wo Contrast  Result Date: 10/11/2018 CLINICAL DATA:  68 y/o  M; cough, fever, chills.  COVID-19 positive. EXAM: CT ANGIOGRAPHY CHEST WITH CONTRAST TECHNIQUE: Multidetector CT imaging of the chest was performed using the standard protocol during bolus administration of intravenous contrast. Multiplanar CT image reconstructions and MIPs were obtained to evaluate  level. No evidence of pulmonary embolism. Normal heart size. No pericardial effusion. Mild aortic and moderate coronary artery calcific atherosclerosis. Mediastinum/Nodes: No enlarged mediastinal, hilar, or axillary lymph nodes. Thyroid gland, trachea, and esophagus demonstrate no significant findings. Lungs/Pleura: Multiple peripheral ground-glass opacities with the largest confluent opacity in the right upper lobe. The right upper lobe demonstrates intra lobular septal thickening "crazy paving" pattern. No pleural effusion or pneumothorax. Upper Abdomen: No acute abnormality. Musculoskeletal: No chest wall abnormality. No acute or significant osseous findings. Review of the MIP images confirms the above findings. IMPRESSION: 1. Mild respiratory motion artifact. No pulmonary embolus identified. 2. There are a spectrum of findings in the lungs which can be seen  with acute atypical infection (as well as other non-infectious etiologies). In particular, viral pneumonia (including COVID-19) should be considered in the appropriate clinical setting. Critical Value/emergent results were called by telephone at the time of interpretation on 10/11/2018 at 3:08 am to PA MIA MCDONALD , who verbally acknowledged these results. Electronically Signed   By: Lance  Furusawa-Stratton M.D.   On: 10/11/2018 03:21  ° °Dg Chest Port 1 View ° °Result Date: 10/19/2018 °CLINICAL DATA:  Shortness of breath EXAM: PORTABLE CHEST 1 VIEW COMPARISON:  10/16/2018 FINDINGS: Multifocal airspace opacities, improved in the upper lungs bilaterally, persistent at the lung bases. This appearance favors improving multifocal pneumonia. No pleural effusion or pneumothorax. The heart is normal in size. IMPRESSION: Improving multifocal pneumonia, as above. Electronically Signed   By: Sriyesh  Krishnan M.D.   On: 10/19/2018 10:21  ° °Dg Chest Port 1 View ° °Result Date: 10/16/2018 °CLINICAL DATA:  Dyspnea.COVID19 EXAM: PORTABLE CHEST 1 VIEW COMPARISON:  10/12/2018 FINDINGS: Heart size appears normal. No pleural effusion identified. Diffuse hazy opacities are noted in the left lung. Airspace consolidation within the right upper lobe and left base is improved from previous exam. IMPRESSION: 1. Persistent left lung hazy opacities compatible with inflammation/infection. 2. Improving consolidation within the right upper lobe and left base. Electronically Signed   By: Taylor  Stroud M.D.   On: 10/16/2018 09:15  ° °Dg Chest Port 1 View ° °Result Date: 10/12/2018 °CLINICAL DATA:  Weakness and fever, history of COVID-19 EXAM: PORTABLE CHEST 1 VIEW COMPARISON:  10/11/2018 FINDINGS: Cardiac shadow is stable. Patchy infiltrates are seen bilaterally particularly in the right upper lobe consistent with that seen on recent CT examination and consistent with the patient's given clinical history. No sizable effusion is seen. No bony  abnormality is noted. Elevation the right hemidiaphragm is again noted. IMPRESSION: Patchy infiltrates predominately within the right upper lobe consistent with the given clinical history of COVID-19 positivity. Electronically Signed   By: Mark  Lukens M.D.   On: 10/12/2018 03:59  ° °Dg Chest Portable 1 View ° °Result Date: 09/26/2018 °CLINICAL DATA:  Symptoms of possible COVID-19 EXAM: PORTABLE CHEST 1 VIEW COMPARISON:  None. FINDINGS: The heart size and mediastinal contours are within normal limits. Both lungs are clear. The visualized skeletal structures are unremarkable. IMPRESSION: No active disease. Electronically Signed   By: Kevin  Herman M.D.   On: 10/02/2018 22:55  ° ° ° °

## 2018-10-23 NOTE — Progress Notes (Signed)
Inpatient Diabetes Program Recommendations  AACE/ADA: New Consensus Statement on Inpatient Glycemic Control (2015)  Target Ranges:  Prepandial:   less than 140 mg/dL      Peak postprandial:   less than 180 mg/dL (1-2 hours)      Critically ill patients:  140 - 180 mg/dL   Lab Results  Component Value Date   GLUCAP 85 10/23/2018   HGBA1C 10.7 (H) 10/11/2018    Review of Glycemic Control Results for Austin Page, Austin Page (MRN [id number]) as of 10/23/2018 09:57  Ref. Range 10/22/2018 07:44 10/22/2018 11:25 10/22/2018 17:34 10/22/2018 22:24 10/23/2018 07:59  Glucose-Capillary Latest Ref Range: 70 - 99 mg/dL 737 (H) 106 (H) 269 (H) 158 (H) 85   Diabetes history: DM 2 Outpatient Diabetes medications: None Current orders for Inpatient glycemic control:  Novolog 10 units tid with meals, Novolog moderate tid with meals and HS Lantus 20 units daily and Lantus 5 units q HS Solumedrol 30 mg daily Inpatient Diabetes Program Recommendations:    Please reduce Lantus to 15 units daily.  Also please reduce Novolog meal coverage to 6 units tid with meals.   Thanks,  Beryl Meager, RN, BC-ADM Inpatient Diabetes Coordinator Pager [phone] (8a-5p)

## 2018-10-23 NOTE — Progress Notes (Signed)
CardioVascular Research Department and AHF Team  ReDS Research Project   Patient #: [id number]  ReDS Measurement  Right: 49 %  Left: 50 %

## 2018-10-24 ENCOUNTER — Inpatient Hospital Stay (HOSPITAL_COMMUNITY): Payer: BC Managed Care – PPO

## 2018-10-24 LAB — BASIC METABOLIC PANEL
Anion gap: 12 (ref 5–15)
BUN: 32 mg/dL — ABNORMAL HIGH (ref 8–23)
CO2: 28 mmol/L (ref 22–32)
Calcium: 8.9 mg/dL (ref 8.9–10.3)
Chloride: 92 mmol/L — ABNORMAL LOW (ref 98–111)
Creatinine, Ser: 0.64 mg/dL (ref 0.61–1.24)
GFR calc Af Amer: >60 mL/min (ref >60)
GFR calc non Af Amer: >60 mL/min (ref >60)
Glucose, Bld: 121 mg/dL — ABNORMAL HIGH (ref 70–99)
Potassium: 3.8 mmol/L (ref 3.5–5.1)
Sodium: 132 mmol/L — ABNORMAL LOW (ref 135–145)

## 2018-10-24 LAB — GLUCOSE, CAPILLARY
Glucose-Capillary: 178 mg/dL — ABNORMAL HIGH (ref 70–99)
Glucose-Capillary: 132 mg/dL — ABNORMAL HIGH (ref 70–99)
Glucose-Capillary: 114 mg/dL — ABNORMAL HIGH (ref 70–99)
Glucose-Capillary: 189 mg/dL — ABNORMAL HIGH (ref 70–99)

## 2018-10-24 LAB — PROCALCITONIN: Procalcitonin: <0.1 ng/mL

## 2018-10-24 LAB — LACTATE DEHYDROGENASE: LDH: 473 U/L — ABNORMAL HIGH (ref 98–192)

## 2018-10-24 LAB — BRAIN NATRIURETIC PEPTIDE: B Natriuretic Peptide: 70.1 pg/mL (ref 0.0–100.0)

## 2018-10-24 MED ORDER — SODIUM CHLORIDE 0.9 % IV SOLN
2.0000 g | Freq: Three times a day (TID) | INTRAVENOUS | Status: DC
  Administered 2018-10-24 – 2018-10-25 (×3): 2 g via INTRAVENOUS
  Filled 2018-10-24 (×3): qty 2

## 2018-10-24 MED ORDER — FUROSEMIDE 10 MG/ML IJ SOLN
60.0000 mg | Freq: Once | INTRAMUSCULAR | Status: AC
  Administered 2018-10-24: 60 mg via INTRAVENOUS
  Filled 2018-10-24: qty 6

## 2018-10-24 MED ORDER — LIP MEDEX EX OINT
TOPICAL_OINTMENT | CUTANEOUS | Status: DC | PRN
  Filled 2018-10-24 (×2): qty 7

## 2018-10-24 MED ORDER — HYDROXYZINE HCL 25 MG PO TABS
25.0000 mg | ORAL_TABLET | Freq: Three times a day (TID) | ORAL | Status: DC | PRN
  Administered 2018-10-24 – 2018-10-25 (×4): 25 mg via ORAL
  Filled 2018-10-24 (×6): qty 1

## 2018-10-24 NOTE — Progress Notes (Signed)
Patient was reevaluated prior to transfer to ICU.  He appeared to be more comfortable in prone position breathing.  Oxygen saturations improved and he was titrated down from 15 L to 6 L.  Appears to be comfortable at this time.  Will hold off on transfer to ICU and continue monitoring progressive care.  Chest x-ray was repeated that showed progressive bilateral infiltrates.  BNP normal.  Procalcitonin ordered and is currently pending.  Will start the patient empirically on cefepime for now.  [employer]

## 2018-10-24 NOTE — Progress Notes (Signed)
Oxygen returned to prior level- pt in prone position- and sats remain at 86%

## 2018-10-24 NOTE — Progress Notes (Signed)
CardioVascular Research Department and AHF Team  ReDS Research Project   Patient #: [id number]  ReDS Measurement  Right: 47 %  Left: 49 %

## 2018-10-24 NOTE — Progress Notes (Signed)
RT called down to access patient due to desat and WOB.  Patient RR is mid to high 40's on [place] HFNC 15 L.  MD and RN at bedside, agreed to move patient to ICU due to WOB for High Flow [place].

## 2018-10-24 NOTE — Progress Notes (Signed)
Pt states he is in contact with family

## 2018-10-24 NOTE — Progress Notes (Signed)
PROGRESS NOTE                                                                                                                                                                                                             Patient Demographics:    Austin Page, is a 68 y.o. male, DOB - Jun 30, 1950, KXF:[id number]  Outpatient Primary MD for the patient is Patient, [name]    LOS - 13  Admit date - 10/06/2018    Chief Complaint  Patient presents with   Cough       Brief Narrative  68 year old male with no significant past medical history came into the hospital and was admitted on 10/01/2018 with 3 days of fever, chills, cough, vomiting and weakness in the setting of having a normal Covid contact.  In the ER he was febrile, tachycardic, tachypneic, underwent a CT angiogram which showed peripheral groundglass opacities.  He was Covid positive himself.  He had rapid deterioration of respiratory status requiring 15 L high flow nasal cannula and was transferred to the ICU on 5/25, he was transferred out of ICU on 10/19/2018. He initially was improving, but then had recurrent hypoxia and tachypnea requiring transfer back to the ICU on 6/5.   Subjective:   Patient had a difficult night. Has had increasing shortness of breath and tachypnea. Difficult for patient to recover from hypoxia. Back up to 15L oxygen. He has a non productive cough.   Assessment  & Plan :     Acute Hypoxic Resp. Failure due to Acute Covid 19 Viral Illness during the ongoing 2020 Covid 19 Pandemic -has received appropriate treatment which include IV steroids, remdesivir, 2 doses of Actemra along with convalescent plasma.  Initially he was improving, but overnight, developped worsening shortness of breath and hypoxia on 15L. Currently tachypneic with borderline oxygen saturations. Will repeat chest xray and check abg. Will transfer to ICU for closer  monitoring.  Encouraged to sit up in chair use flutter valve and I-S for pulmonary toiletry.  Encouraged prone position breathing.  Inflammatory markers have stabilized we will continue to monitor closely.  Still tenuous.    He is also getting intermittent IV Lasix as needed based on physical exam and BNP, he will require outpatient cardiology follow-up and echocardiogram.          COVID-19 Labs  Recent Labs    10/22/18 0320 10/23/18 0330 10/24/18 0430  DDIMER 1.75* 2.08*  --   FERRITIN 249 256  --   LDH 452* 460* 473*  CRP <0.8 <0.8  --     Lab Results  Component Value Date   SARSCOV2NAA POSITIVE (A) 10/14/2018        Component Value Date/Time   BNP 931.9 (H) 10/21/2018 0925    Treatment so far -Received Actemra x2 on 5/24 and 5/25 -Received convalescent plasma on 5/25 -continue Solu-Medrol for now -completed a course of remdesivir   Hyponatremia -clinically, he appears to be euvolemic.  Urine osmolality is elevated.  Uric acid is low.  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  He has been receiving intermittent doses of Lasix.  We will give another dose today  Hypokalemia - replaced and stable   Newly Diagnosed DM2 -poor outpatient control due to hyperglycemia, on Lantus + ISS, will provide DM and Insulin education.  Blood sugars remain elevated, will further adjust insulin.  Lab Results  Component Value Date   HGBA1C 10.7 (H) 10/11/2018   CBG (last 3)  Recent Labs    10/23/18 1658 10/23/18 2051 10/24/18 0810  GLUCAP 95 178* 178*      Condition - Extremely Guarded  Family Communication  : updated daughter over the phone 6/3  Code Status :  Full  Diet : Regular  Disposition Plan  :  Transfer to ICU for closer management since he is at high risk for further decompensation  Consults  :  None  Procedures  :  None  PUD Prophylaxis : None  DVT Prophylaxis  :  Lovenox   Lab Results  Component Value Date   PLT 326 10/23/2018    Inpatient  Medications  Scheduled Meds:  chlorhexidine  15 mL Mouth Rinse BID   enoxaparin (LOVENOX) injection  40 mg Subcutaneous Q12H   feeding supplement (GLUCERNA SHAKE)  237 mL Oral BID WC   furosemide  60 mg Intravenous Once   insulin aspart  0-15 Units Subcutaneous TID WC   insulin aspart  0-5 Units Subcutaneous QHS   insulin aspart  6 Units Subcutaneous TID WC   insulin glargine  15 Units Subcutaneous Daily   insulin glargine  5 Units Subcutaneous QHS   insulin starter kit- syringes  1 kit Other Once   living well with diabetes book   Does not apply Once   mouth rinse  15 mL Mouth Rinse q12n4p   methylPREDNISolone (SOLU-MEDROL) injection  30 mg Intravenous Daily   metoprolol tartrate  25 mg Oral BID   Continuous Infusions:  PRN Meds:.acetaminophen, alum & mag hydroxide-simeth, guaiFENesin-dextromethorphan, HYDROcodone-acetaminophen, [DISCONTINUED] ondansetron **OR** ondansetron (ZOFRAN) IV, senna-docusate, sodium chloride  Antibiotics  :    Anti-infectives (From admission, onward)   Start     Dose/Rate Route Frequency Ordered Stop   10/13/18 1400  remdesivir 100 mg in sodium chloride 0.9 % 230 mL IVPB     100 mg over 30 Minutes Intravenous Every 24 hours 10/12/18 1113 10/16/18 1930   10/12/18 1400  remdesivir 200 mg in sodium chloride 0.9 % 210 mL IVPB     200 mg over 30 Minutes Intravenous Once 10/12/18 1113 10/12/18 1749       Critical care Time Spent in minutes  30   Kathie Dike M.D on 10/24/2018 at 8:31 AM  To page go to [email or web address] - password [facility]  [facility] -  Office  [phone]   See all Orders  from today for further details    Objective:   Vitals:   10/23/18 2312 10/24/18 0337 10/24/18 0725 10/24/18 0809  BP: 125/86 123/76    Pulse: (!) 111 (!) 111 (!) 118 (!) 111  Resp: (!) 36 (!) 39 (!) 46 (!) 45  Temp: 98.6 F (37 C) 97.9 F (36.6 C)  (!) 97.3 F (36.3 C)  TempSrc: Oral Oral  Axillary  SpO2: 90% 90% (!) 58% 91%  Weight:       Height:        Wt Readings from Last 3 Encounters:  09/20/2018 72.6 kg     Intake/Output Summary (Last 24 hours) at 10/24/2018 0831 Last data filed at 10/24/2018 0336 Gross per 24 hour  Intake --  Output 300 ml  Net -300 ml     Physical Exam  General exam: Alert, awake, oriented x 3 Respiratory system: Clear to auscultation. Increased respiratory effort. Cardiovascular system:RRR. No murmurs, rubs, gallops. Gastrointestinal system: Abdomen is nondistended, soft and nontender. No organomegaly or masses felt. Normal bowel sounds heard. Central nervous system: Alert and oriented. No focal neurological deficits. Extremities: No C/C/E, +pedal pulses Skin: No rashes, lesions or ulcers Psychiatry: Judgement and insight appear normal. Mood & affect appropriate.         Data Review:    CBC Recent Labs  Lab 10/19/18 0347 10/20/18 0525 10/21/18 0300 10/22/18 0320 10/23/18 0330  WBC 13.2* 13.9* 15.6* 15.6* 17.4*  HGB 15.8 16.0 15.9 15.7 15.8  HCT 46.8 47.9 47.2 46.5 47.2  PLT 349 337 334 300 326  MCV 88.6 89.2 89.7 88.7 88.2  MCH 29.9 29.8 30.2 30.0 29.5  MCHC 33.8 33.4 33.7 33.8 33.5  RDW 13.5 13.7 13.5 13.5 13.2  LYMPHSABS  --  1.7 2.3 1.9 2.7  MONOABS  --  0.4 0.6 0.6 0.7  EOSABS  --  0.2 0.1 0.1 0.1  BASOSABS  --  0.0 0.0 0.0 0.0    Chemistries  Recent Labs  Lab 10/19/18 0347 10/20/18 0525 10/21/18 0300 10/22/18 0320 10/23/18 0330 10/24/18 0430  NA 134* 133* 130* 132* 131* 132*  K 4.6 3.8 4.0 3.3* 3.8 3.8  CL 95* 93* 93* 88* 89* 92*  CO2 24 27 24 28 28 28   GLUCOSE 81 99 113* 31* 72 121*  BUN 34* 40* 31* 35* 27* 32*  CREATININE 0.76 0.72 0.65 0.67 0.66 0.64  CALCIUM 9.1 9.0 8.8* 8.8* 8.9 8.9  MG 2.4 2.4 2.4 2.5* 2.6*  --   AST 35 31 33 34 35  --   ALT 36 32 26 25 27   --   ALKPHOS 80 78 75 73 77  --   BILITOT 0.5 0.6 1.0 0.7 0.8  --     ------------------------------------------------------------------------------------------------------------------ No results for input(s): CHOL, HDL, LDLCALC, TRIG, CHOLHDL, LDLDIRECT in the last 72 hours.  Lab Results  Component Value Date   HGBA1C 10.7 (H) 10/11/2018   ------------------------------------------------------------------------------------------------------------------ No results for input(s): TSH, T4TOTAL, T3FREE, THYROIDAB in the last 72 hours.  Invalid input(s): FREET3  Cardiac Enzymes No results for input(s): CKMB, TROPONINI, MYOGLOBIN in the last 168 hours.  Invalid input(s): CK ------------------------------------------------------------------------------------------------------------------    Component Value Date/Time   BNP 931.9 (H) 10/21/2018 2956    Micro Results No results found for this or any previous visit (from the past 240 hour(s)).  Radiology Reports Ct Angio Chest Pe W And/or Wo Contrast  Result Date: 10/11/2018 CLINICAL DATA:  69 y/o  M; cough, fever, chills.  COVID-19 positive. EXAM:  CT ANGIOGRAPHY CHEST WITH CONTRAST TECHNIQUE: Multidetector CT imaging of the chest was performed using the standard protocol during bolus administration of intravenous contrast. Multiplanar CT image reconstructions and MIPs were obtained to evaluate the vascular anatomy. CONTRAST:  190m OMNIPAQUE IOHEXOL 300 MG/ML  SOLN COMPARISON:  10/05/2018 chest radiograph FINDINGS: Cardiovascular: Mild respiratory motion artifact. Satisfactory opacification of the pulmonary arteries to the segmental level. No evidence of pulmonary embolism. Normal heart size. No pericardial effusion. Mild aortic and moderate coronary artery calcific atherosclerosis. Mediastinum/Nodes: No enlarged mediastinal, hilar, or axillary lymph nodes. Thyroid gland, trachea, and esophagus demonstrate no significant findings. Lungs/Pleura: Multiple peripheral ground-glass opacities with the largest confluent  opacity in the right upper lobe. The right upper lobe demonstrates intra lobular septal thickening "crazy paving" pattern. No pleural effusion or pneumothorax. Upper Abdomen: No acute abnormality. Musculoskeletal: No chest wall abnormality. No acute or significant osseous findings. Review of the MIP images confirms the above findings. IMPRESSION: 1. Mild respiratory motion artifact. No pulmonary embolus identified. 2. There are a spectrum of findings in the lungs which can be seen with acute atypical infection (as well as other non-infectious etiologies). In particular, viral pneumonia (including COVID-19) should be considered in the appropriate clinical setting. Critical Value/emergent results were called by telephone at the time of interpretation on 10/11/2018 at 3:08 am to [place], who verbally acknowledged these results. Electronically Signed   By: LKristine GarbeM.D.   On: 10/11/2018 03:21   Dg Chest Port 1 View  Result Date: 10/19/2018 CLINICAL DATA:  Shortness of breath EXAM: PORTABLE CHEST 1 VIEW COMPARISON:  10/16/2018 FINDINGS: Multifocal airspace opacities, improved in the upper lungs bilaterally, persistent at the lung bases. This appearance favors improving multifocal pneumonia. No pleural effusion or pneumothorax. The heart is normal in size. IMPRESSION: Improving multifocal pneumonia, as above. Electronically Signed   By: SJulian HyM.D.   On: 10/19/2018 10:21   Dg Chest Port 1 View  Result Date: 10/16/2018 CLINICAL DATA:  Dyspnea.COVID19 EXAM: PORTABLE CHEST 1 VIEW COMPARISON:  10/12/2018 FINDINGS: Heart size appears normal. No pleural effusion identified. Diffuse hazy opacities are noted in the left lung. Airspace consolidation within the right upper lobe and left base is improved from previous exam. IMPRESSION: 1. Persistent left lung hazy opacities compatible with inflammation/infection. 2. Improving consolidation within the right upper lobe and left base.  Electronically Signed   By: TKerby MoorsM.D.   On: 10/16/2018 09:15   Dg Chest Port 1 View  Result Date: 10/12/2018 CLINICAL DATA:  Weakness and fever, history of COVID-19 EXAM: PORTABLE CHEST 1 VIEW COMPARISON:  10/11/2018 FINDINGS: Cardiac shadow is stable. Patchy infiltrates are seen bilaterally particularly in the right upper lobe consistent with that seen on recent CT examination and consistent with the patient's given clinical history. No sizable effusion is seen. No bony abnormality is noted. Elevation the right hemidiaphragm is again noted. IMPRESSION: Patchy infiltrates predominately within the right upper lobe consistent with the given clinical history of COVID-19 positivity. Electronically Signed   By: MInez CatalinaM.D.   On: 10/12/2018 03:59   Dg Chest Portable 1 View  Result Date: 10/11/2018 CLINICAL DATA:  Symptoms of possible COVID-19 EXAM: PORTABLE CHEST 1 VIEW COMPARISON:  None. FINDINGS: The heart size and mediastinal contours are within normal limits. Both lungs are clear. The visualized skeletal structures are unremarkable. IMPRESSION: No active disease. Electronically Signed   By: KUlyses JarredM.D.   On: 10/14/2018 22:55

## 2018-10-24 NOTE — Progress Notes (Signed)
Contacted pt's daughter for update and to inform her of pt moving to ICU.  All questions welcomed and answered.  Report given to [place], Charity fundraiser.

## 2018-10-24 NOTE — Progress Notes (Signed)
Physician notified of pt's increased O2 requirements and increased work of breathing.  Pt desaturates quickly with small movements in bed with a long recovery time.  Sats fell to 58% on 15 L HFNC.  Pt drowsy.  Respirations 40-50/min.  Orders for stat CXR.  Dr. Kerry Hough coming to evaluate.  RT at bedside.

## 2018-10-24 NOTE — Progress Notes (Signed)
Pharmacy Antibiotic Note  Austin Page is a 68 y.o. male admitted on Oct 24, 2018 with COVID-19 illness requiring ICU care 5/25-5/31.  On 6/5, developed worsening hypoxia/tachypnea and CXR showed progressive bilateral infiltrates. Pharmacy has been consulted for Cefepime dosing for HAP.  Afebrile WBC 17.4 (6/4) SCr 0.64, CrCl ~83 ml/min PCT pending  Plan: Cefepime 2g IV q8h Follow up renal function, clinical progress  Height: 5\' 7"  (170.2 cm) Weight: 160 lb (72.6 kg) IBW/kg (Calculated) : 66.1  Temp (24hrs), Avg:98 F (36.7 C), Min:97.3 F (36.3 C), Max:98.6 F (37 C)  Recent Labs  Lab 10/19/18 0347 10/20/18 0525 10/21/18 0300 10/22/18 0320 10/23/18 0330 10/24/18 0430  WBC 13.2* 13.9* 15.6* 15.6* 17.4*  --   CREATININE 0.76 0.72 0.65 0.67 0.66 0.64    Estimated Creatinine Clearance: 83.8 mL/min (by C-G formula based on SCr of 0.64 mg/dL).    No Known Allergies  Antimicrobials this admission: 5/24 Remdesivir >> 5/28 5/24 Actemra 5/25 Actemra   Dose adjustments this admission:  Microbiology results: 5/23 BCx: NGF  Thank you for allowing pharmacy to be a part of this patient's care.  Loralee Pacas, PharmD, BCPS Pharmacy: [phone] 10/24/2018 7:43 PM

## 2018-10-25 LAB — COMPREHENSIVE METABOLIC PANEL
ALT: 39 U/L (ref 0–44)
AST: 36 U/L (ref 15–41)
Albumin: 3.3 g/dL — ABNORMAL LOW (ref 3.5–5.0)
Alkaline Phosphatase: 70 U/L (ref 38–126)
Anion gap: 10 (ref 5–15)
BUN: 38 mg/dL — ABNORMAL HIGH (ref 8–23)
CO2: 31 mmol/L (ref 22–32)
Calcium: 9 mg/dL (ref 8.9–10.3)
Chloride: 92 mmol/L — ABNORMAL LOW (ref 98–111)
Creatinine, Ser: 0.79 mg/dL (ref 0.61–1.24)
GFR calc Af Amer: >60 mL/min (ref >60)
GFR calc non Af Amer: >60 mL/min (ref >60)
Glucose, Bld: 94 mg/dL (ref 70–99)
Potassium: 3.9 mmol/L (ref 3.5–5.1)
Sodium: 133 mmol/L — ABNORMAL LOW (ref 135–145)
Total Bilirubin: 0.8 mg/dL (ref 0.3–1.2)
Total Protein: 6.5 g/dL (ref 6.5–8.1)

## 2018-10-25 LAB — CBC WITH DIFFERENTIAL/PLATELET
Abs Immature Granulocytes: 0.11 10*3/uL — ABNORMAL HIGH (ref 0.00–0.07)
Basophils Absolute: 0 10*3/uL (ref 0.0–0.1)
Basophils Relative: 0 %
Eosinophils Absolute: 0 10*3/uL (ref 0.0–0.5)
Eosinophils Relative: 0 %
HCT: 48.6 % (ref 39.0–52.0)
Hemoglobin: 15.8 g/dL (ref 13.0–17.0)
Immature Granulocytes: 1 %
Lymphocytes Relative: 10 %
Lymphs Abs: 1.6 10*3/uL (ref 0.7–4.0)
MCH: 29.8 pg (ref 26.0–34.0)
MCHC: 32.5 g/dL (ref 30.0–36.0)
MCV: 91.7 fL (ref 80.0–100.0)
Monocytes Absolute: 0.7 10*3/uL (ref 0.1–1.0)
Monocytes Relative: 4 %
Neutro Abs: 14.5 10*3/uL — ABNORMAL HIGH (ref 1.7–7.7)
Neutrophils Relative %: 85 %
Platelets: 331 10*3/uL (ref 150–400)
RBC: 5.3 MIL/uL (ref 4.22–5.81)
RDW: 13.6 % (ref 11.5–15.5)
WBC: 16.9 10*3/uL — ABNORMAL HIGH (ref 4.0–10.5)
nRBC: 0 % (ref 0.0–0.2)

## 2018-10-25 LAB — C-REACTIVE PROTEIN: CRP: <0.8 mg/dL (ref <1.0)

## 2018-10-25 LAB — GLUCOSE, CAPILLARY
Glucose-Capillary: 139 mg/dL — ABNORMAL HIGH (ref 70–99)
Glucose-Capillary: 235 mg/dL — ABNORMAL HIGH (ref 70–99)
Glucose-Capillary: 243 mg/dL — ABNORMAL HIGH (ref 70–99)
Glucose-Capillary: 303 mg/dL — ABNORMAL HIGH (ref 70–99)

## 2018-10-25 LAB — LACTATE DEHYDROGENASE: LDH: 412 U/L — ABNORMAL HIGH (ref 98–192)

## 2018-10-25 LAB — FERRITIN: Ferritin: 277 ng/mL (ref 24–336)

## 2018-10-25 MED ORDER — FUROSEMIDE 20 MG PO TABS
60.0000 mg | ORAL_TABLET | Freq: Once | ORAL | Status: AC
  Administered 2018-10-25: 60 mg via ORAL
  Filled 2018-10-25: qty 3

## 2018-10-25 MED ORDER — POTASSIUM CHLORIDE 20 MEQ PO PACK
20.0000 meq | PACK | Freq: Once | ORAL | Status: AC
  Administered 2018-10-25: 20 meq via ORAL
  Filled 2018-10-25: qty 1

## 2018-10-25 MED ORDER — MAGNESIUM SULFATE IN D5W 1-5 GM/100ML-% IV SOLN
1.0000 g | Freq: Once | INTRAVENOUS | Status: AC
  Administered 2018-10-25: 1 g via INTRAVENOUS
  Filled 2018-10-25: qty 100

## 2018-10-25 MED ORDER — METHYLPREDNISOLONE SODIUM SUCC 125 MG IJ SOLR
60.0000 mg | Freq: Every day | INTRAMUSCULAR | Status: DC
  Administered 2018-10-25 – 2018-11-04 (×11): 60 mg via INTRAVENOUS
  Filled 2018-10-25 (×11): qty 2

## 2018-10-25 MED ORDER — FUROSEMIDE 10 MG/ML IJ SOLN: 60.0000 mg | Freq: Once | INTRAMUSCULAR | Status: DC

## 2018-10-25 NOTE — Progress Notes (Signed)
Talked to patients spouse, gave update.

## 2018-10-25 NOTE — Progress Notes (Signed)
[facility] Research Department and AHF Team  ReDS Research Project   Patient #: [id number]  ReDS Measurement  Right: 48 %  Left: 45 %

## 2018-10-25 NOTE — Progress Notes (Addendum)
PROGRESS NOTE                                                                                                                                                                                                             Patient Demographics:    Austin Page, is a 68 y.o. male, DOB - 02/13/1951, ZTI:[id number]  Outpatient Primary MD for the patient is Patient, [name]    LOS - 14  Admit date - 09/19/2018    Chief Complaint  Patient presents with   Cough       Brief Narrative  68 year old male with no significant past medical history came into the hospital and was admitted on 10/05/2018 with 3 days of fever, chills, cough, vomiting and weakness in the setting of having a normal Covid contact.  In the ER he was febrile, tachycardic, tachypneic, underwent a CT angiogram which showed peripheral groundglass opacities.  He was Covid positive himself.  He had rapid deterioration of respiratory status requiring 15 L high flow nasal cannula and was transferred to the ICU on 5/25, he was transferred out of ICU on 10/19/2018 and transferred to my care on the same date.   Subjective:   Patient in bed, appears comfortable, denies any headache, no fever, no chest pain or pressure, mild shortness of breath , no abdominal pain. No focal weakness.   Assessment  & Plan :     1. Acute Hypoxic Resp. Failure due to Acute Covid 19 Viral Illness during the ongoing 2020 Covid 19 Pandemic -has received appropriate treatment which include IV steroids, 2 doses of Actemra along with convalescent plasma.    Treatment so far  - Remdesivir started on 5/24 - Received Actemra x 2 on 5/24 and 5/25 - Received convalescent plasma on 5/25 - Continue IV Solu-Medrol was increased again on 10/25/2018  Hypoxia seems to be quite positional however he has shown little signs of improvement, with pruning and sitting up improves temporarily, currently on 15 L high  flow nasal cannula oxygen.  He has been between 30 L to 4 L in the last few days, continue gentle diuresis, increase IV steroid, he has already received maximum possible treatment.  Prognosis is poor, discussed with him he wishes to be DNR but wants full medical treatment which will be continued.  He also updated his daughter  as well on 10/25/2018.  Continue IV steroids, Lasix, sitting up in chair, pruning in bed, flutter valve and I-S for pulmonary toiletry and monitor.  COVID-19 Labs  Recent Labs    10/23/18 0330 10/24/18 0430 10/25/18 0110 10/25/18 0118  DDIMER 2.08*  --   --   --   FERRITIN 256  --  277  --   LDH 460* 473*  --  412*  CRP <0.8  --  <0.8  --     Lab Results  Component Value Date   SARSCOV2NAA POSITIVE (A) 09/22/2018      Component Value Date/Time   BNP 70.1 10/24/2018 0507    Acute on chronic nonspecific CHF.  Could be mostly diastolic.  Does have bilateral rails on exam, no previous echo on file.  Diurese, low-dose beta-blocker and monitor.  Outpatient cardiology follow-up and echocardiogram.    Hyponatremia - from [facility] diurese and monitor.  Hypokalemia - replaced and stable   Newly Diagnosed DM2 -poor outpatient control due to hyperglycemia, on Lantus + ISS, will provide DM and Insulin education.  Lab Results  Component Value Date   HGBA1C 10.7 (H) 10/11/2018   CBG (last 3)  Recent Labs    10/24/18 1627 10/24/18 2145 10/25/18 0721  GLUCAP 114* 189* 139*      Condition - Extremely Guarded  Family Communication  : Daughter [name] by me over the phone on 10/25/2018.  Code Status :  DNR  Diet : Regular  Disposition Plan  :  Home 3-4 days  Consults  :  None  Procedures  :  None  PUD Prophylaxis : None  DVT Prophylaxis  :  Lovenox   Lab Results  Component Value Date   PLT 331 10/25/2018    Inpatient Medications  Scheduled Meds:  chlorhexidine  15 mL Mouth Rinse BID   enoxaparin (LOVENOX) injection  40 mg Subcutaneous Q12H    feeding supplement (GLUCERNA SHAKE)  237 mL Oral BID WC   furosemide  60 mg Intravenous Once   insulin aspart  0-15 Units Subcutaneous TID WC   insulin aspart  0-5 Units Subcutaneous QHS   insulin aspart  6 Units Subcutaneous TID WC   insulin glargine  15 Units Subcutaneous Daily   insulin starter kit- syringes  1 kit Other Once   living well with diabetes book   Does not apply Once   mouth rinse  15 mL Mouth Rinse q12n4p   methylPREDNISolone (SOLU-MEDROL) injection  60 mg Intravenous Daily   metoprolol tartrate  25 mg Oral BID   Continuous Infusions:  ceFEPime (MAXIPIME) IV 2 g (10/25/18 0351)   PRN Meds:.acetaminophen, alum & mag hydroxide-simeth, guaiFENesin-dextromethorphan, HYDROcodone-acetaminophen, hydrOXYzine, lip balm, [DISCONTINUED] ondansetron **OR** ondansetron (ZOFRAN) IV, senna-docusate, sodium chloride  Antibiotics  :    Anti-infectives (From admission, onward)   Start     Dose/Rate Route Frequency Ordered Stop   10/24/18 2000  ceFEPIme (MAXIPIME) 2 g in sodium chloride 0.9 % 100 mL IVPB     2 g 200 mL/hr over 30 Minutes Intravenous Every 8 hours 10/24/18 1943     10/13/18 1400  remdesivir 100 mg in sodium chloride 0.9 % 230 mL IVPB     100 mg over 30 Minutes Intravenous Every 24 hours 10/12/18 1113 10/16/18 1930   10/12/18 1400  remdesivir 200 mg in sodium chloride 0.9 % 210 mL IVPB     200 mg over 30 Minutes Intravenous Once 10/12/18 1113 10/12/18 1749  Time Spent in minutes  30   Lala Lund M.D on 10/25/2018 at 10:57 AM  To page go to [email or web address] - password [facility]  [facility] -  Office  [phone]   See all Orders from today for further details    Objective:   Vitals:   10/24/18 1600 10/24/18 2005 10/25/18 0500 10/25/18 0829  BP:  121/77 121/71 134/81  Pulse: 93  93 (!) 116  Resp: 19  19 (!) 34  Temp:  98.7 F (37.1 C) 97.9 F (36.6 C) (!) 97.4 F (36.3 C)  TempSrc:  Oral Axillary Axillary  SpO2: 95%  93% (!) 87%    Weight:      Height:        Wt Readings from Last 3 Encounters:  09/22/2018 72.6 kg     Intake/Output Summary (Last 24 hours) at 10/25/2018 1057 Last data filed at 10/25/2018 0900 Gross per 24 hour  Intake 700 ml  Output 750 ml  Net -50 ml     Physical Exam  Awake Alert, Oriented X 3, No new F.N deficits, Normal affect [place].AT,PERRAL Supple Neck,No JVD, No cervical lymphadenopathy appriciated.  Symmetrical Chest wall movement, Good air movement bilaterally,+ve rales RRR,No Gallops, Rubs or new Murmurs, No Parasternal Heave +ve B.Sounds, Abd Soft, No tenderness, No organomegaly appriciated, No rebound - guarding or rigidity. No Cyanosis, Clubbing or edema, No new Rash or bruise    Data Review:    CBC Recent Labs  Lab 10/20/18 0525 10/21/18 0300 10/22/18 0320 10/23/18 0330 10/25/18 0728  WBC 13.9* 15.6* 15.6* 17.4* 16.9*  HGB 16.0 15.9 15.7 15.8 15.8  HCT 47.9 47.2 46.5 47.2 48.6  PLT 337 334 300 326 331  MCV 89.2 89.7 88.7 88.2 91.7  MCH 29.8 30.2 30.0 29.5 29.8  MCHC 33.4 33.7 33.8 33.5 32.5  RDW 13.7 13.5 13.5 13.2 13.6  LYMPHSABS 1.7 2.3 1.9 2.7 1.6  MONOABS 0.4 0.6 0.6 0.7 0.7  EOSABS 0.2 0.1 0.1 0.1 0.0  BASOSABS 0.0 0.0 0.0 0.0 0.0    Chemistries  Recent Labs  Lab 10/19/18 0347 10/20/18 0525 10/21/18 0300 10/22/18 0320 10/23/18 0330 10/24/18 0430 10/25/18 0118  NA 134* 133* 130* 132* 131* 132* 133*  K 4.6 3.8 4.0 3.3* 3.8 3.8 3.9  CL 95* 93* 93* 88* 89* 92* 92*  CO2 _0 GLUCOSE 81 99 113* 31* 72 121* 94  BUN 34* 40* 31* 35* 27* 32* 38*  CREATININE 0.76 0.72 0.65 0.67 0.66 0.64 0.79  CALCIUM 9.1 9.0 8.8* 8.8* 8.9 8.9 9.0  MG 2.4 2.4 2.4 2.5* 2.6*  --   --   AST 35 31 33 34 35  --  36  ALT 36 32 _1 --  39  ALKPHOS 80 78 75 73 77  --  70  BILITOT 0.5 0.6 1.0 0.7 0.8  --  0.8   ------------------------------------------------------------------------------------------------------------------ No results for input(s):  CHOL, HDL, LDLCALC, TRIG, CHOLHDL, LDLDIRECT in the last 72 hours.  Lab Results  Component Value Date   HGBA1C 10.7 (H) 10/11/2018   ------------------------------------------------------------------------------------------------------------------ No results for input(s): TSH, T4TOTAL, T3FREE, THYROIDAB in the last 72 hours.  Invalid input(s): FREET3  Cardiac Enzymes No results for input(s): CKMB, TROPONINI, MYOGLOBIN in the last 168 hours.  Invalid input(s): CK ------------------------------------------------------------------------------------------------------------------    Component Value Date/Time   BNP 70.1 10/24/2018 0507    Micro Results No results found for this or any previous visit (from the past  240 hour(s)).  Radiology Reports Ct Angio Chest Pe W And/or Wo Contrast  Result Date: 10/11/2018 CLINICAL DATA:  68 y/o  M; cough, fever, chills.  COVID-19 positive. EXAM: CT ANGIOGRAPHY CHEST WITH CONTRAST TECHNIQUE: Multidetector CT imaging of the chest was performed using the standard protocol during bolus administration of intravenous contrast. Multiplanar CT image reconstructions and MIPs were obtained to evaluate the vascular anatomy. CONTRAST:  154m OMNIPAQUE IOHEXOL 300 MG/ML  SOLN COMPARISON:  10/16/2018 chest radiograph FINDINGS: Cardiovascular: Mild respiratory motion artifact. Satisfactory opacification of the pulmonary arteries to the segmental level. No evidence of pulmonary embolism. Normal heart size. No pericardial effusion. Mild aortic and moderate coronary artery calcific atherosclerosis. Mediastinum/Nodes: No enlarged mediastinal, hilar, or axillary lymph nodes. Thyroid gland, trachea, and esophagus demonstrate no significant findings. Lungs/Pleura: Multiple peripheral ground-glass opacities with the largest confluent opacity in the right upper lobe. The right upper lobe demonstrates intra lobular septal thickening "crazy paving" pattern. No pleural effusion or  pneumothorax. Upper Abdomen: No acute abnormality. Musculoskeletal: No chest wall abnormality. No acute or significant osseous findings. Review of the MIP images confirms the above findings. IMPRESSION: 1. Mild respiratory motion artifact. No pulmonary embolus identified. 2. There are a spectrum of findings in the lungs which can be seen with acute atypical infection (as well as other non-infectious etiologies). In particular, viral pneumonia (including COVID-19) should be considered in the appropriate clinical setting. Critical Value/emergent results were called by telephone at the time of interpretation on 10/11/2018 at 3:08 am to PA MDexter, who verbally acknowledged these results. Electronically Signed   By: LKristine GarbeM.D.   On: 10/11/2018 03:21   Dg Chest Port 1 View  Result Date: 10/24/2018 CLINICAL DATA:  Shortness of breath.  COVID-19. EXAM: PORTABLE CHEST 1 VIEW COMPARISON:  Chest x-rays dated 10/16/2018, 10/12/2018 and 09/22/2018 FINDINGS: The bilateral pulmonary infiltrates have progressed. Heart size and vascularity are normal. No effusions. No significant bone abnormality. Aortic atherosclerosis. IMPRESSION: Progression of the bilateral hazy pulmonary infiltrates since the prior study of 05/20 Aortic Atherosclerosis (ICD10-I70.0). Electronically Signed   By: JLorriane ShireM.D.   On: 10/24/2018 08:46   Dg Chest Port 1 View  Result Date: 10/19/2018 CLINICAL DATA:  Shortness of breath EXAM: PORTABLE CHEST 1 VIEW COMPARISON:  10/16/2018 FINDINGS: Multifocal airspace opacities, improved in the upper lungs bilaterally, persistent at the lung bases. This appearance favors improving multifocal pneumonia. No pleural effusion or pneumothorax. The heart is normal in size. IMPRESSION: Improving multifocal pneumonia, as above. Electronically Signed   By: SJulian HyM.D.   On: 10/19/2018 10:21   Dg Chest Port 1 View  Result Date: 10/16/2018 CLINICAL DATA:  Dyspnea.COVID19 EXAM:  PORTABLE CHEST 1 VIEW COMPARISON:  10/12/2018 FINDINGS: Heart size appears normal. No pleural effusion identified. Diffuse hazy opacities are noted in the left lung. Airspace consolidation within the right upper lobe and left base is improved from previous exam. IMPRESSION: 1. Persistent left lung hazy opacities compatible with inflammation/infection. 2. Improving consolidation within the right upper lobe and left base. Electronically Signed   By: TKerby MoorsM.D.   On: 10/16/2018 09:15   Dg Chest Port 1 View  Result Date: 10/12/2018 CLINICAL DATA:  Weakness and fever, history of COVID-19 EXAM: PORTABLE CHEST 1 VIEW COMPARISON:  10/11/2018 FINDINGS: Cardiac shadow is stable. Patchy infiltrates are seen bilaterally particularly in the right upper lobe consistent with that seen on recent CT examination and consistent with the patient's given clinical history. No sizable effusion is  seen. No bony abnormality is noted. Elevation the right hemidiaphragm is again noted. IMPRESSION: Patchy infiltrates predominately within the right upper lobe consistent with the given clinical history of COVID-19 positivity. Electronically Signed   By: Inez Catalina M.D.   On: 10/12/2018 03:59   Dg Chest Portable 1 View  Result Date: 09/27/2018 CLINICAL DATA:  Symptoms of possible COVID-19 EXAM: PORTABLE CHEST 1 VIEW COMPARISON:  None. FINDINGS: The heart size and mediastinal contours are within normal limits. Both lungs are clear. The visualized skeletal structures are unremarkable. IMPRESSION: No active disease. Electronically Signed   By: Ulyses Jarred M.D.   On: 09/24/2018 22:55

## 2018-10-26 ENCOUNTER — Inpatient Hospital Stay (HOSPITAL_COMMUNITY): Payer: BC Managed Care – PPO

## 2018-10-26 LAB — COMPREHENSIVE METABOLIC PANEL
ALT: 43 U/L (ref 0–44)
AST: 35 U/L (ref 15–41)
Albumin: 3.1 g/dL — ABNORMAL LOW (ref 3.5–5.0)
Alkaline Phosphatase: 75 U/L (ref 38–126)
Anion gap: 11 (ref 5–15)
BUN: 42 mg/dL — ABNORMAL HIGH (ref 8–23)
CO2: 28 mmol/L (ref 22–32)
Calcium: 9.1 mg/dL (ref 8.9–10.3)
Chloride: 94 mmol/L — ABNORMAL LOW (ref 98–111)
Creatinine, Ser: 0.71 mg/dL (ref 0.61–1.24)
GFR calc Af Amer: >60 mL/min (ref >60)
GFR calc non Af Amer: >60 mL/min (ref >60)
Glucose, Bld: 104 mg/dL — ABNORMAL HIGH (ref 70–99)
Potassium: 4 mmol/L (ref 3.5–5.1)
Sodium: 133 mmol/L — ABNORMAL LOW (ref 135–145)
Total Bilirubin: 1 mg/dL (ref 0.3–1.2)
Total Protein: 6.5 g/dL (ref 6.5–8.1)

## 2018-10-26 LAB — CBC WITH DIFFERENTIAL/PLATELET
Abs Immature Granulocytes: 0.15 10*3/uL — ABNORMAL HIGH (ref 0.00–0.07)
Basophils Absolute: 0 10*3/uL (ref 0.0–0.1)
Basophils Relative: 0 %
Eosinophils Absolute: 0 10*3/uL (ref 0.0–0.5)
Eosinophils Relative: 0 %
HCT: 47.7 % (ref 39.0–52.0)
Hemoglobin: 16.2 g/dL (ref 13.0–17.0)
Immature Granulocytes: 1 %
Lymphocytes Relative: 9 %
Lymphs Abs: 1.8 10*3/uL (ref 0.7–4.0)
MCH: 30.1 pg (ref 26.0–34.0)
MCHC: 34 g/dL (ref 30.0–36.0)
MCV: 88.7 fL (ref 80.0–100.0)
Monocytes Absolute: 0.7 10*3/uL (ref 0.1–1.0)
Monocytes Relative: 4 %
Neutro Abs: 17 10*3/uL — ABNORMAL HIGH (ref 1.7–7.7)
Neutrophils Relative %: 86 %
Platelets: 341 10*3/uL (ref 150–400)
RBC: 5.38 MIL/uL (ref 4.22–5.81)
RDW: 13.5 % (ref 11.5–15.5)
WBC: 19.6 10*3/uL — ABNORMAL HIGH (ref 4.0–10.5)
nRBC: 0 % (ref 0.0–0.2)

## 2018-10-26 LAB — GLUCOSE, CAPILLARY
Glucose-Capillary: 116 mg/dL — ABNORMAL HIGH (ref 70–99)
Glucose-Capillary: 185 mg/dL — ABNORMAL HIGH (ref 70–99)
Glucose-Capillary: 308 mg/dL — ABNORMAL HIGH (ref 70–99)
Glucose-Capillary: 192 mg/dL — ABNORMAL HIGH (ref 70–99)

## 2018-10-26 LAB — C-REACTIVE PROTEIN: CRP: <0.8 mg/dL (ref <1.0)

## 2018-10-26 LAB — BRAIN NATRIURETIC PEPTIDE: B Natriuretic Peptide: 56.1 pg/mL (ref 0.0–100.0)

## 2018-10-26 LAB — MAGNESIUM: Magnesium: 2.5 mg/dL — ABNORMAL HIGH (ref 1.7–2.4)

## 2018-10-26 LAB — D-DIMER, QUANTITATIVE: D-Dimer, Quant: 1.18 ug/mL-FEU — ABNORMAL HIGH (ref 0.00–0.50)

## 2018-10-26 LAB — LACTATE DEHYDROGENASE: LDH: 434 U/L — ABNORMAL HIGH (ref 98–192)

## 2018-10-26 LAB — FERRITIN: Ferritin: 262 ng/mL (ref 24–336)

## 2018-10-26 NOTE — Progress Notes (Signed)
PROGRESS NOTE                                                                                                                                                                                                             Patient Demographics:    Austin Page, is a 68 y.o. male, DOB - 1950/12/10, EHO:[id number]  Outpatient Primary MD for the patient is Patient, [name]    LOS - 15  Admit date - 10/02/2018    Chief Complaint  Patient presents with   Cough       Brief Narrative  68 year old male with no significant past medical history came into the hospital and was admitted on 10/02/2018 with 3 days of fever, chills, cough, vomiting and weakness in the setting of having a normal Covid contact.  In the ER he was febrile, tachycardic, tachypneic, underwent a CT angiogram which showed peripheral groundglass opacities.  He was Covid positive himself.  He had rapid deterioration of respiratory status requiring 15 L high flow nasal cannula and was transferred to the ICU on 5/25, he was transferred out of ICU on 10/19/2018 and transferred to my care on the same date.   Subjective:   Patient in bed, appears comfortable, denies any headache, no fever, no chest pain or pressure, no shortness of breath , no abdominal pain. No focal weakness.   Assessment  & Plan :     1. Acute Hypoxic Resp. Failure due to Acute Covid 19 Viral Illness during the ongoing 2020 Covid 19 Pandemic - has received appropriate treatment which include IV steroids, 2 doses of Actemra along with convalescent plasma.    Treatment so far  - Remdesivir started on 5/24 - Received Actemra x 2 on 5/24 and 5/25 - Received convalescent plasma on 5/25 - Continue IV Solu-Medrol was increased again on 10/25/2018  Hypoxia seems to be quite positional however he has shown little signs of improvement, with pruning and sitting up improves temporarily, currently on 15 L high  flow nasal cannula oxygen.  He has been between high flow 30 L to 4 L in the last few days, has been adequately diuresed, on IV steroids which will be continued dose was adjusted on 10/25/2018.  Continue sitting up in chair in the daytime with I-S and flutter valve for pulmonary toiletry and prone in bed  at night.  Continues to remain very tenuous.  Prognosis is poor he is DNR.  Daughter was updated by me personally on 10/25/2018.    COVID-19 Labs  Recent Labs    10/24/18 0430 10/25/18 0110 10/25/18 0118 10/26/18 0530  DDIMER  --   --   --  1.18*  FERRITIN  --  277  --  262  LDH 473*  --  412* 434*  CRP  --  <0.8  --  <0.8    Lab Results  Component Value Date   SARSCOV2NAA POSITIVE (A) 10/13/2018      Component Value Date/Time   BNP 70.1 10/24/2018 0507    Acute on chronic nonspecific CHF.  Could be mostly diastolic.  Does have bilateral rails on exam, no previous echo on file.  Diurese, low-dose beta-blocker and monitor.  Outpatient cardiology follow-up and echocardiogram.    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  Hypokalemia - replaced and stable   Newly Diagnosed DM2 - poor outpatient control due to hyperglycemia A1c of 10.7, Lantus ISS dose was adjusted on 10/25/2018.  Continue to monitor and adjust as needed, DM insulin education has been ordered.  Lab Results  Component Value Date   HGBA1C 10.7 (H) 10/11/2018   CBG (last 3)  Recent Labs    10/25/18 1620 10/25/18 2113 10/26/18 0811  GLUCAP 243* 303* 116*      Condition - Extremely Guarded  Family Communication  : Daughter Austin Page by me over the phone on 10/25/2018.  Code Status :  DNR  Diet : Regular  Disposition Plan  :  Home once better  Consults  :  None  Procedures  :  None  PUD Prophylaxis : None  DVT Prophylaxis  :  Lovenox   Lab Results  Component Value Date   PLT 341 10/26/2018    Inpatient Medications  Scheduled Meds:  chlorhexidine  15 mL Mouth Rinse BID     enoxaparin (LOVENOX) injection  40 mg Subcutaneous Q12H   feeding supplement (GLUCERNA SHAKE)  237 mL Oral BID WC   insulin aspart  0-15 Units Subcutaneous TID WC   insulin aspart  0-5 Units Subcutaneous QHS   insulin aspart  6 Units Subcutaneous TID WC   insulin glargine  15 Units Subcutaneous Daily   insulin starter kit- syringes  1 kit Other Once   living well with diabetes book   Does not apply Once   mouth rinse  15 mL Mouth Rinse q12n4p   methylPREDNISolone (SOLU-MEDROL) injection  60 mg Intravenous Daily   metoprolol tartrate  25 mg Oral BID   Continuous Infusions:  PRN Meds:.acetaminophen, alum & mag hydroxide-simeth, guaiFENesin-dextromethorphan, hydrOXYzine, lip balm, [DISCONTINUED] ondansetron **OR** ondansetron (ZOFRAN) IV, senna-docusate, sodium chloride  Antibiotics  :    Anti-infectives (From admission, onward)   Start     Dose/Rate Route Frequency Ordered Stop   10/24/18 2000  ceFEPIme (MAXIPIME) 2 g in sodium chloride 0.9 % 100 mL IVPB  Status:  Discontinued     2 g 200 mL/hr over 30 Minutes Intravenous Every 8 hours 10/24/18 1943 10/25/18 1222   10/13/18 1400  remdesivir 100 mg in sodium chloride 0.9 % 230 mL IVPB     100 mg over 30 Minutes Intravenous Every 24 hours 10/12/18 1113 10/16/18 1930   10/12/18 1400  remdesivir 200 mg in sodium chloride 0.9 % 210 mL IVPB     200 mg over 30 Minutes Intravenous Once 10/12/18 1113 10/12/18 1749  Time Spent in minutes  30   Lala Lund M.D on 10/26/2018 at 8:57 AM  To page go to [email or web address] - password [facility]  [facility] -  Office  [phone]   See all Orders from today for further details    Objective:   Vitals:   10/25/18 1533 10/25/18 1938 10/26/18 0338 10/26/18 0649  BP: 116/74 (!) 148/90 (!) 143/97   Pulse: (!) 111 (!) 123 (!) 104 (!) 106  Resp: (!) 32 (!) 32 (!) 30 19  Temp: 97.7 F (36.5 C) 97.9 F (36.6 C)    TempSrc: Axillary Axillary Axillary   SpO2: 92% 93% 97%  95%  Weight:      Height:        Wt Readings from Last 3 Encounters:  10/12/2018 72.6 kg     Intake/Output Summary (Last 24 hours) at 10/26/2018 0857 Last data filed at 10/26/2018 0649 Gross per 24 hour  Intake 700 ml  Output 1750 ml  Net -1050 ml     Physical Exam  Awake Alert, Oriented X 3, No new F.N deficits, Normal affect [place].AT,PERRAL Supple Neck,No JVD, No cervical lymphadenopathy appriciated.  Symmetrical Chest wall movement, Good air movement bilaterally, few rales RRR,No Gallops, Rubs or new Murmurs, No Parasternal Heave +ve B.Sounds, Abd Soft, No tenderness, No organomegaly appriciated, No rebound - guarding or rigidity. No Cyanosis, Clubbing or edema, No new Rash or bruise    Data Review:    CBC Recent Labs  Lab 10/21/18 0300 10/22/18 0320 10/23/18 0330 10/25/18 0728 10/26/18 0530  WBC 15.6* 15.6* 17.4* 16.9* 19.6*  HGB 15.9 15.7 15.8 15.8 16.2  HCT 47.2 46.5 47.2 48.6 47.7  PLT 334 300 326 331 341  MCV 89.7 88.7 88.2 91.7 88.7  MCH 30.2 30.0 29.5 29.8 30.1  MCHC 33.7 33.8 33.5 32.5 34.0  RDW 13.5 13.5 13.2 13.6 13.5  LYMPHSABS 2.3 1.9 2.7 1.6 1.8  MONOABS 0.6 0.6 0.7 0.7 0.7  EOSABS 0.1 0.1 0.1 0.0 0.0  BASOSABS 0.0 0.0 0.0 0.0 0.0    Chemistries  Recent Labs  Lab 10/20/18 0525 10/21/18 0300 10/22/18 0320 10/23/18 0330 10/24/18 0430 10/25/18 0118 10/26/18 0530  NA 133* 130* 132* 131* 132* 133* 133*  K 3.8 4.0 3.3* 3.8 3.8 3.9 4.0  CL 93* 93* 88* 89* 92* 92* 94*  CO2 _0 GLUCOSE 99 113* 31* 72 121* 94 104*  BUN 40* 31* 35* 27* 32* 38* 42*  CREATININE 0.72 0.65 0.67 0.66 0.64 0.79 0.71  CALCIUM 9.0 8.8* 8.8* 8.9 8.9 9.0 9.1  MG 2.4 2.4 2.5* 2.6*  --   --  2.5*  AST 31 33 34 35  --  36 35  ALT 32 _1 --  39 43  ALKPHOS 78 75 73 77  --  70 75  BILITOT 0.6 1.0 0.7 0.8  --  0.8 1.0   ------------------------------------------------------------------------------------------------------------------ No results for  input(s): CHOL, HDL, LDLCALC, TRIG, CHOLHDL, LDLDIRECT in the last 72 hours.  Lab Results  Component Value Date   HGBA1C 10.7 (H) 10/11/2018   ------------------------------------------------------------------------------------------------------------------ No results for input(s): TSH, T4TOTAL, T3FREE, THYROIDAB in the last 72 hours.  Invalid input(s): FREET3  Cardiac Enzymes No results for input(s): CKMB, TROPONINI, MYOGLOBIN in the last 168 hours.  Invalid input(s): CK ------------------------------------------------------------------------------------------------------------------    Component Value Date/Time   BNP 70.1 10/24/2018 0507    Micro Results No results found for this or any previous visit (from  the past 240 hour(s)).  Radiology Reports Ct Angio Chest Pe W And/or Wo Contrast  Result Date: 10/11/2018 CLINICAL DATA:  68 y/o  M; cough, fever, chills.  COVID-19 positive. EXAM: CT ANGIOGRAPHY CHEST WITH CONTRAST TECHNIQUE: Multidetector CT imaging of the chest was performed using the standard protocol during bolus administration of intravenous contrast. Multiplanar CT image reconstructions and MIPs were obtained to evaluate the vascular anatomy. CONTRAST:  175m OMNIPAQUE IOHEXOL 300 MG/ML  SOLN COMPARISON:  10/14/2018 chest radiograph FINDINGS: Cardiovascular: Mild respiratory motion artifact. Satisfactory opacification of the pulmonary arteries to the segmental level. No evidence of pulmonary embolism. Normal heart size. No pericardial effusion. Mild aortic and moderate coronary artery calcific atherosclerosis. Mediastinum/Nodes: No enlarged mediastinal, hilar, or axillary lymph nodes. Thyroid gland, trachea, and esophagus demonstrate no significant findings. Lungs/Pleura: Multiple peripheral ground-glass opacities with the largest confluent opacity in the right upper lobe. The right upper lobe demonstrates intra lobular septal thickening "crazy paving" pattern. No pleural  effusion or pneumothorax. Upper Abdomen: No acute abnormality. Musculoskeletal: No chest wall abnormality. No acute or significant osseous findings. Review of the MIP images confirms the above findings. IMPRESSION: 1. Mild respiratory motion artifact. No pulmonary embolus identified. 2. There are a spectrum of findings in the lungs which can be seen with acute atypical infection (as well as other non-infectious etiologies). In particular, viral pneumonia (including COVID-19) should be considered in the appropriate clinical setting. Critical Value/emergent results were called by telephone at the time of interpretation on 10/11/2018 at 3:08 am to [place], who verbally acknowledged these results. Electronically Signed   By: LKristine GarbeM.D.   On: 10/11/2018 03:21   Dg Chest Port 1 View  Result Date: 10/24/2018 CLINICAL DATA:  Shortness of breath.  COVID-19. EXAM: PORTABLE CHEST 1 VIEW COMPARISON:  Chest x-rays dated 10/16/2018, 10/12/2018 and 10/15/2018 FINDINGS: The bilateral pulmonary infiltrates have progressed. Heart size and vascularity are normal. No effusions. No significant bone abnormality. Aortic atherosclerosis. IMPRESSION: Progression of the bilateral hazy pulmonary infiltrates since the prior study of 05/20 Aortic Atherosclerosis (ICD10-I70.0). Electronically Signed   By: JLorriane ShireM.D.   On: 10/24/2018 08:46   Dg Chest Port 1 View  Result Date: 10/19/2018 CLINICAL DATA:  Shortness of breath EXAM: PORTABLE CHEST 1 VIEW COMPARISON:  10/16/2018 FINDINGS: Multifocal airspace opacities, improved in the upper lungs bilaterally, persistent at the lung bases. This appearance favors improving multifocal pneumonia. No pleural effusion or pneumothorax. The heart is normal in size. IMPRESSION: Improving multifocal pneumonia, as above. Electronically Signed   By: SJulian HyM.D.   On: 10/19/2018 10:21   Dg Chest Port 1 View  Result Date: 10/16/2018 CLINICAL DATA:   Dyspnea.COVID19 EXAM: PORTABLE CHEST 1 VIEW COMPARISON:  10/12/2018 FINDINGS: Heart size appears normal. No pleural effusion identified. Diffuse hazy opacities are noted in the left lung. Airspace consolidation within the right upper lobe and left base is improved from previous exam. IMPRESSION: 1. Persistent left lung hazy opacities compatible with inflammation/infection. 2. Improving consolidation within the right upper lobe and left base. Electronically Signed   By: TKerby MoorsM.D.   On: 10/16/2018 09:15   Dg Chest Port 1 View  Result Date: 10/12/2018 CLINICAL DATA:  Weakness and fever, history of COVID-19 EXAM: PORTABLE CHEST 1 VIEW COMPARISON:  10/11/2018 FINDINGS: Cardiac shadow is stable. Patchy infiltrates are seen bilaterally particularly in the right upper lobe consistent with that seen on recent CT examination and consistent with the patient's given clinical history. No sizable  effusion is seen. No bony abnormality is noted. Elevation the right hemidiaphragm is again noted. IMPRESSION: Patchy infiltrates predominately within the right upper lobe consistent with the given clinical history of COVID-19 positivity. Electronically Signed   By: Inez Catalina M.D.   On: 10/12/2018 03:59   Dg Chest Portable 1 View  Result Date: 10/16/2018 CLINICAL DATA:  Symptoms of possible COVID-19 EXAM: PORTABLE CHEST 1 VIEW COMPARISON:  None. FINDINGS: The heart size and mediastinal contours are within normal limits. Both lungs are clear. The visualized skeletal structures are unremarkable. IMPRESSION: No active disease. Electronically Signed   By: Ulyses Jarred M.D.   On: 09/28/2018 22:55

## 2018-10-26 NOTE — Progress Notes (Signed)
CardioVascular Research Department and AHF Team  ReDS Research Project   Patient #: [id number]  ReDS Measurement  Right: 42 %  Left: 40 %

## 2018-10-27 LAB — C-REACTIVE PROTEIN: CRP: <0.8 mg/dL (ref <1.0)

## 2018-10-27 LAB — CBC WITH DIFFERENTIAL/PLATELET
Abs Immature Granulocytes: 0.1 10*3/uL — ABNORMAL HIGH (ref 0.00–0.07)
Basophils Absolute: 0 10*3/uL (ref 0.0–0.1)
Basophils Relative: 0 %
Eosinophils Absolute: 0 10*3/uL (ref 0.0–0.5)
Eosinophils Relative: 0 %
HCT: 48.5 % (ref 39.0–52.0)
Hemoglobin: 16.1 g/dL (ref 13.0–17.0)
Immature Granulocytes: 1 %
Lymphocytes Relative: 10 %
Lymphs Abs: 1.6 10*3/uL (ref 0.7–4.0)
MCH: 30 pg (ref 26.0–34.0)
MCHC: 33.2 g/dL (ref 30.0–36.0)
MCV: 90.3 fL (ref 80.0–100.0)
Monocytes Absolute: 0.7 10*3/uL (ref 0.1–1.0)
Monocytes Relative: 4 %
Neutro Abs: 13.4 10*3/uL — ABNORMAL HIGH (ref 1.7–7.7)
Neutrophils Relative %: 85 %
Platelets: 333 10*3/uL (ref 150–400)
RBC: 5.37 MIL/uL (ref 4.22–5.81)
RDW: 13.7 % (ref 11.5–15.5)
WBC: 15.8 10*3/uL — ABNORMAL HIGH (ref 4.0–10.5)
nRBC: 0 % (ref 0.0–0.2)

## 2018-10-27 LAB — COMPREHENSIVE METABOLIC PANEL
ALT: 41 U/L (ref 0–44)
AST: 27 U/L (ref 15–41)
Albumin: 3.2 g/dL — ABNORMAL LOW (ref 3.5–5.0)
Alkaline Phosphatase: 70 U/L (ref 38–126)
Anion gap: 10 (ref 5–15)
BUN: 42 mg/dL — ABNORMAL HIGH (ref 8–23)
CO2: 32 mmol/L (ref 22–32)
Calcium: 9.1 mg/dL (ref 8.9–10.3)
Chloride: 93 mmol/L — ABNORMAL LOW (ref 98–111)
Creatinine, Ser: 0.79 mg/dL (ref 0.61–1.24)
GFR calc Af Amer: >60 mL/min (ref >60)
GFR calc non Af Amer: >60 mL/min (ref >60)
Glucose, Bld: 114 mg/dL — ABNORMAL HIGH (ref 70–99)
Potassium: 4.1 mmol/L (ref 3.5–5.1)
Sodium: 135 mmol/L (ref 135–145)
Total Bilirubin: 1 mg/dL (ref 0.3–1.2)
Total Protein: 6.3 g/dL — ABNORMAL LOW (ref 6.5–8.1)

## 2018-10-27 LAB — D-DIMER, QUANTITATIVE: D-Dimer, Quant: 0.93 ug/mL-FEU — ABNORMAL HIGH (ref 0.00–0.50)

## 2018-10-27 LAB — GLUCOSE, CAPILLARY
Glucose-Capillary: 163 mg/dL — ABNORMAL HIGH (ref 70–99)
Glucose-Capillary: 140 mg/dL — ABNORMAL HIGH (ref 70–99)
Glucose-Capillary: 150 mg/dL — ABNORMAL HIGH (ref 70–99)
Glucose-Capillary: 174 mg/dL — ABNORMAL HIGH (ref 70–99)

## 2018-10-27 LAB — FERRITIN: Ferritin: 285 ng/mL (ref 24–336)

## 2018-10-27 LAB — BRAIN NATRIURETIC PEPTIDE: B Natriuretic Peptide: 41.7 pg/mL (ref 0.0–100.0)

## 2018-10-27 LAB — LACTATE DEHYDROGENASE: LDH: 360 U/L — ABNORMAL HIGH (ref 98–192)

## 2018-10-27 LAB — MAGNESIUM: Magnesium: 2.5 mg/dL — ABNORMAL HIGH (ref 1.7–2.4)

## 2018-10-27 MED ORDER — FUROSEMIDE 10 MG/ML IJ SOLN
40.0000 mg | Freq: Once | INTRAMUSCULAR | Status: AC
  Administered 2018-10-27: 40 mg via INTRAVENOUS
  Filled 2018-10-27: qty 4

## 2018-10-27 MED ORDER — ALBUTEROL SULFATE HFA 108 (90 BASE) MCG/ACT IN AERS
2.0000 | INHALATION_SPRAY | Freq: Four times a day (QID) | RESPIRATORY_TRACT | Status: DC
  Administered 2018-10-27 – 2018-10-30 (×12): 2 via RESPIRATORY_TRACT
  Filled 2018-10-27: qty 6.7

## 2018-10-27 MED ORDER — POTASSIUM CHLORIDE 20 MEQ PO PACK
40.0000 meq | PACK | Freq: Once | ORAL | Status: AC
  Administered 2018-10-27: 40 meq via ORAL
  Filled 2018-10-27: qty 2

## 2018-10-27 NOTE — Progress Notes (Signed)
CardioVascular Rewsearch Department and AHF Team  ReDS Research Project   Patient #: [id number]  ReDS Measurement  Right: low quality x 3  Left: 34%

## 2018-10-27 NOTE — Progress Notes (Signed)
PROGRESS NOTE                                                                                                                                                                                                             Patient Demographics:    Austin Page, is a 68 y.o. male, DOB - [birth year], UQJ:[id number]  Outpatient Primary MD for the patient is Patient, [name]    LOS - 16  Admit date - 10/17/2018    Chief Complaint  Patient presents with   Cough       Brief Narrative  68 year old male with no significant past medical history came into the hospital and was admitted on 10/09/2018 with 3 days of fever, chills, cough, vomiting and weakness in the setting of having a normal Covid contact.  In the ER he was febrile, tachycardic, tachypneic, underwent a CT angiogram which showed peripheral groundglass opacities.  He was Covid positive himself.  He had rapid deterioration of respiratory status requiring 15 L high flow nasal cannula and was transferred to the ICU on 5/25, he was transferred out of ICU on 10/19/2018 and transferred to my care on the same date.   Subjective:   Patient in bed, appears comfortable, denies any headache, no fever, no chest pain or pressure, no shortness of breath , no abdominal pain. No focal weakness.   Assessment  & Plan :     1. Acute Hypoxic Resp. Failure due to Acute Covid 19 Viral Illness during the ongoing 2020 Covid 19 Pandemic - has received appropriate treatment which include IV steroids, 2 doses of Actemra along with convalescent plasma.    Treatment so far  - Remdesivir started on 5/24 - Received Actemra x 2 on 5/24 and 5/25 - Received convalescent plasma on 5/25 - Continue IV Solu-Medrol was increased again on 10/25/2018  Hypoxia seems to be quite positional however he has shown little signs of improvement, with pruning and sitting up improves temporarily, currently on 15 L high  flow nasal cannula oxygen.  He has been between high flow 30 L to 4 L in the last few days, has been adequately diuresed, on IV steroids which will be continued dose was adjusted on 10/25/2018.  Continue sitting up in chair in the daytime with I-S and flutter valve for pulmonary toiletry and prone in bed  at night.  Unfortunately he continues to remain extremely tenuous and hypoxic.  Prognosis is poor he is DNR.  Daughter was updated by me personally on 10/25/2018.  Continue supportive care as he has already maximized his treatment, no further intubation.  Continue high flow nasal cannula oxygen as needed.  Monitor clinically.  COVID-19 Labs  Recent Labs    10/25/18 0110 10/25/18 0118 10/26/18 0530 10/27/18 0305  DDIMER  --   --  1.18* 0.93*  FERRITIN 277  --  262 285  LDH  --  412* 434* 360*  CRP <0.8  --  <0.8 <0.8    Lab Results  Component Value Date   SARSCOV2NAA POSITIVE (A) 10/09/2018      Component Value Date/Time   BNP 41.7 10/27/2018 0305    Acute on chronic nonspecific CHF.  Could be mostly diastolic.  Does have bilateral rails on exam, no previous echo on file.  Diurese, low-dose beta-blocker and monitor.  Lasix IV repeated on 10/27/2018, outpatient cardiology follow-up and echocardiogram.    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  Hypokalemia - replaced and stable   Newly Diagnosed DM2 - poor outpatient control due to hyperglycemia A1c of 10.7, Lantus ISS dose was adjusted on 10/25/2018.  Continue to monitor and adjust as needed, DM insulin education has been ordered.  Lab Results  Component Value Date   HGBA1C 10.7 (H) 10/11/2018   CBG (last 3)  Recent Labs    10/26/18 1803 10/26/18 2144 10/27/18 0731  GLUCAP 308* 192* 163*      Condition - Extremely Guarded  Family Communication  : Daughter Austin Page by me over the phone on 10/25/2018.  Code Status :  DNR  Diet : Regular  Disposition Plan  :  Home once better  Consults  :   None  Procedures  :  None  PUD Prophylaxis : None  DVT Prophylaxis  :  Lovenox   Lab Results  Component Value Date   PLT 333 10/27/2018    Inpatient Medications  Scheduled Meds:  albuterol  2 puff Inhalation Q6H   chlorhexidine  15 mL Mouth Rinse BID   enoxaparin (LOVENOX) injection  40 mg Subcutaneous Q12H   feeding supplement (GLUCERNA SHAKE)  237 mL Oral BID WC   insulin aspart  0-15 Units Subcutaneous TID WC   insulin aspart  0-5 Units Subcutaneous QHS   insulin aspart  6 Units Subcutaneous TID WC   insulin glargine  15 Units Subcutaneous Daily   insulin starter kit- syringes  1 kit Other Once   living well with diabetes book   Does not apply Once   mouth rinse  15 mL Mouth Rinse q12n4p   methylPREDNISolone (SOLU-MEDROL) injection  60 mg Intravenous Daily   metoprolol tartrate  25 mg Oral BID   potassium chloride  40 mEq Oral Once   Continuous Infusions:  PRN Meds:.acetaminophen, alum & mag hydroxide-simeth, guaiFENesin-dextromethorphan, hydrOXYzine, lip balm, [DISCONTINUED] ondansetron **OR** ondansetron (ZOFRAN) IV, senna-docusate, sodium chloride  Antibiotics  :    Anti-infectives (From admission, onward)   Start     Dose/Rate Route Frequency Ordered Stop   10/24/18 2000  ceFEPIme (MAXIPIME) 2 g in sodium chloride 0.9 % 100 mL IVPB  Status:  Discontinued     2 g 200 mL/hr over 30 Minutes Intravenous Every 8 hours 10/24/18 1943 10/25/18 1222   10/13/18 1400  remdesivir 100 mg in sodium chloride 0.9 % 230 mL IVPB     100  mg over 30 Minutes Intravenous Every 24 hours 10/12/18 1113 10/16/18 1930   10/12/18 1400  remdesivir 200 mg in sodium chloride 0.9 % 210 mL IVPB     200 mg over 30 Minutes Intravenous Once 10/12/18 1113 10/12/18 1749       Time Spent in minutes  30   Lala Lund M.D on 10/27/2018 at 9:40 AM  To page go to [email or web address] - password [facility]  [facility] -  Office  [phone]   See all Orders from today for further  details    Objective:   Vitals:   10/26/18 0649 10/26/18 0919 10/26/18 1807 10/26/18 2000  BP:  (!) 143/91 123/76 140/82  Pulse: (!) 106 (!) 118  (!) 116  Resp: 19 (!) 25    Temp:   (!) 97.3 F (36.3 C) 97.8 F (36.6 C)  TempSrc:   Oral Oral  SpO2: 95% 96%  92%  Weight:      Height:        Wt Readings from Last 3 Encounters:  10/14/2018 72.6 kg     Intake/Output Summary (Last 24 hours) at 10/27/2018 0940 Last data filed at 10/26/2018 1806 Gross per 24 hour  Intake 660 ml  Output --  Net 660 ml     Physical Exam  Awake Alert, Oriented X 3, No new F.N deficits, Normal affect [place].AT,PERRAL Supple Neck,No JVD, No cervical lymphadenopathy appriciated.  Symmetrical Chest wall movement, Good air movement bilaterally, few rales RRR,No Gallops, Rubs or new Murmurs, No Parasternal Heave +ve B.Sounds, Abd Soft, No tenderness, No organomegaly appriciated, No rebound - guarding or rigidity. No Cyanosis, Clubbing or edema, No new Rash or bruise     Data Review:    CBC Recent Labs  Lab 10/22/18 0320 10/23/18 0330 10/25/18 0728 10/26/18 0530 10/27/18 0305  WBC 15.6* 17.4* 16.9* 19.6* 15.8*  HGB 15.7 15.8 15.8 16.2 16.1  HCT 46.5 47.2 48.6 47.7 48.5  PLT 300 326 331 341 333  MCV 88.7 88.2 91.7 88.7 90.3  MCH 30.0 29.5 29.8 30.1 30.0  MCHC 33.8 33.5 32.5 34.0 33.2  RDW 13.5 13.2 13.6 13.5 13.7  LYMPHSABS 1.9 2.7 1.6 1.8 1.6  MONOABS 0.6 0.7 0.7 0.7 0.7  EOSABS 0.1 0.1 0.0 0.0 0.0  BASOSABS 0.0 0.0 0.0 0.0 0.0    Chemistries  Recent Labs  Lab 10/21/18 0300 10/22/18 0320 10/23/18 0330 10/24/18 0430 10/25/18 0118 10/26/18 0530 10/27/18 0305  NA 130* 132* 131* 132* 133* 133* 135  K 4.0 3.3* 3.8 3.8 3.9 4.0 4.1  CL 93* 88* 89* 92* 92* 94* 93*  CO2 24 28 28 28 31 28  32  GLUCOSE 113* 31* 72 121* 94 104* 114*  BUN 31* 35* 27* 32* 38* 42* 42*  CREATININE 0.65 0.67 0.66 0.64 0.79 0.71 0.79  CALCIUM 8.8* 8.8* 8.9 8.9 9.0 9.1 9.1  MG 2.4 2.5* 2.6*  --   --  2.5*  2.5*  AST 33 34 35  --  36 35 27  ALT 26 25 27   --  39 43 41  ALKPHOS 75 73 77  --  70 75 70  BILITOT 1.0 0.7 0.8  --  0.8 1.0 1.0   ------------------------------------------------------------------------------------------------------------------ No results for input(s): CHOL, HDL, LDLCALC, TRIG, CHOLHDL, LDLDIRECT in the last 72 hours.  Lab Results  Component Value Date   HGBA1C 10.7 (H) 10/11/2018   ------------------------------------------------------------------------------------------------------------------ No results for input(s): TSH, T4TOTAL, T3FREE, THYROIDAB in the last 72 hours.  Invalid input(s): FREET3  Cardiac Enzymes No results for input(s): CKMB, TROPONINI, MYOGLOBIN in the last 168 hours.  Invalid input(s): CK ------------------------------------------------------------------------------------------------------------------    Component Value Date/Time   BNP 41.7 10/27/2018 0305    Micro Results No results found for this or any previous visit (from the past 240 hour(s)).  Radiology Reports Ct Chest Wo Contrast  Result Date: 10/26/2018 CLINICAL DATA:  Acute respiratory illness, COVID-19 positive. EXAM: CT CHEST WITHOUT CONTRAST TECHNIQUE: Multidetector CT imaging of the chest was performed following the standard protocol without IV contrast. COMPARISON:  10/11/2018 FINDINGS: Cardiovascular: Heart is normal size. Calcified plaque over the left main, left anterior descending and lateral circumflex coronary arteries. Calcified plaque over the thoracic aorta. Remaining vascular structures are unremarkable. Mediastinum/Nodes: No significant mediastinal or hilar adenopathy. Remaining mediastinal structures are unremarkable. Lungs/Pleura: Lungs are adequately inflated demonstrate moderate interval worsening of a patchy bilateral airspace process demonstrating ground-glass and crazy paving features and is mid to lower lung predominant. No effusion. Airways unremarkable.  Upper Abdomen: No acute findings. Calcified plaque over the abdominal aorta. Musculoskeletal: Mild degenerative change of the spine. IMPRESSION: Interval worsening of bilateral multifocal airspace process worse over the mid to lower lungs compatible with worsening known viral pneumonia. Aortic Atherosclerosis (ICD10-I70.0). Atherosclerotic coronary artery disease. Electronically Signed   By: Marin Olp M.D.   On: 10/26/2018 15:43   Ct Angio Chest Pe W And/or Wo Contrast  Result Date: 10/11/2018 CLINICAL DATA:  68 y/o  M; cough, fever, chills.  COVID-19 positive. EXAM: CT ANGIOGRAPHY CHEST WITH CONTRAST TECHNIQUE: Multidetector CT imaging of the chest was performed using the standard protocol during bolus administration of intravenous contrast. Multiplanar CT image reconstructions and MIPs were obtained to evaluate the vascular anatomy. CONTRAST:  122m OMNIPAQUE IOHEXOL 300 MG/ML  SOLN COMPARISON:  09/23/2018 chest radiograph FINDINGS: Cardiovascular: Mild respiratory motion artifact. Satisfactory opacification of the pulmonary arteries to the segmental level. No evidence of pulmonary embolism. Normal heart size. No pericardial effusion. Mild aortic and moderate coronary artery calcific atherosclerosis. Mediastinum/Nodes: No enlarged mediastinal, hilar, or axillary lymph nodes. Thyroid gland, trachea, and esophagus demonstrate no significant findings. Lungs/Pleura: Multiple peripheral ground-glass opacities with the largest confluent opacity in the right upper lobe. The right upper lobe demonstrates intra lobular septal thickening "crazy paving" pattern. No pleural effusion or pneumothorax. Upper Abdomen: No acute abnormality. Musculoskeletal: No chest wall abnormality. No acute or significant osseous findings. Review of the MIP images confirms the above findings. IMPRESSION: 1. Mild respiratory motion artifact. No pulmonary embolus identified. 2. There are a spectrum of findings in the lungs which can be  seen with acute atypical infection (as well as other non-infectious etiologies). In particular, viral pneumonia (including COVID-19) should be considered in the appropriate clinical setting. Critical Value/emergent results were called by telephone at the time of interpretation on 10/11/2018 at 3:08 am to [place], who verbally acknowledged these results. Electronically Signed   By: LKristine GarbeM.D.   On: 10/11/2018 03:21   Dg Chest Port 1 View  Result Date: 10/24/2018 CLINICAL DATA:  Shortness of breath.  COVID-19. EXAM: PORTABLE CHEST 1 VIEW COMPARISON:  Chest x-rays dated 10/16/2018, 10/12/2018 and 09/24/2018 FINDINGS: The bilateral pulmonary infiltrates have progressed. Heart size and vascularity are normal. No effusions. No significant bone abnormality. Aortic atherosclerosis. IMPRESSION: Progression of the bilateral hazy pulmonary infiltrates since the prior study of 05/20 Aortic Atherosclerosis (ICD10-I70.0). Electronically Signed   By: JLorriane ShireM.D.   On: 10/24/2018 08:46   [facility]  Result Date: 10/19/2018 CLINICAL DATA:  Shortness of breath EXAM: PORTABLE CHEST 1 VIEW COMPARISON:  10/16/2018 FINDINGS: Multifocal airspace opacities, improved in the upper lungs bilaterally, persistent at the lung bases. This appearance favors improving multifocal pneumonia. No pleural effusion or pneumothorax. The heart is normal in size. IMPRESSION: Improving multifocal pneumonia, as above. Electronically Signed   By: Julian Hy M.D.   On: 10/19/2018 10:21   Dg Chest Port 1 View  Result Date: 10/16/2018 CLINICAL DATA:  Dyspnea.COVID19 EXAM: PORTABLE CHEST 1 VIEW COMPARISON:  10/12/2018 FINDINGS: Heart size appears normal. No pleural effusion identified. Diffuse hazy opacities are noted in the left lung. Airspace consolidation within the right upper lobe and left base is improved from previous exam. IMPRESSION: 1. Persistent left lung hazy opacities compatible with  inflammation/infection. 2. Improving consolidation within the right upper lobe and left base. Electronically Signed   By: Kerby Moors M.D.   On: 10/16/2018 09:15   Dg Chest Port 1 View  Result Date: 10/12/2018 CLINICAL DATA:  Weakness and fever, history of COVID-19 EXAM: PORTABLE CHEST 1 VIEW COMPARISON:  10/11/2018 FINDINGS: Cardiac shadow is stable. Patchy infiltrates are seen bilaterally particularly in the right upper lobe consistent with that seen on recent CT examination and consistent with the patient's given clinical history. No sizable effusion is seen. No bony abnormality is noted. Elevation the right hemidiaphragm is again noted. IMPRESSION: Patchy infiltrates predominately within the right upper lobe consistent with the given clinical history of COVID-19 positivity. Electronically Signed   By: Inez Catalina M.D.   On: 10/12/2018 03:59   Dg Chest Portable 1 View  Result Date: 10/19/2018 CLINICAL DATA:  Symptoms of possible COVID-19 EXAM: PORTABLE CHEST 1 VIEW COMPARISON:  None. FINDINGS: The heart size and mediastinal contours are within normal limits. Both lungs are clear. The visualized skeletal structures are unremarkable. IMPRESSION: No active disease. Electronically Signed   By: Ulyses Jarred M.D.   On: 10/04/2018 22:55

## 2018-10-27 NOTE — Plan of Care (Signed)
Discussed with patient plan of care for the evening, pain management and importance of proning with some teach back displayed.  Called wife and gave her an update on his status.

## 2018-10-28 LAB — COMPREHENSIVE METABOLIC PANEL
ALT: 41 U/L (ref 0–44)
AST: 25 U/L (ref 15–41)
Albumin: 3.4 g/dL — ABNORMAL LOW (ref 3.5–5.0)
Alkaline Phosphatase: 71 U/L (ref 38–126)
Anion gap: 13 (ref 5–15)
BUN: 45 mg/dL — ABNORMAL HIGH (ref 8–23)
CO2: 30 mmol/L (ref 22–32)
Calcium: 8.9 mg/dL (ref 8.9–10.3)
Chloride: 90 mmol/L — ABNORMAL LOW (ref 98–111)
Creatinine, Ser: 0.69 mg/dL (ref 0.61–1.24)
GFR calc Af Amer: >60 mL/min (ref >60)
GFR calc non Af Amer: >60 mL/min (ref >60)
Glucose, Bld: 93 mg/dL (ref 70–99)
Potassium: 3.8 mmol/L (ref 3.5–5.1)
Sodium: 133 mmol/L — ABNORMAL LOW (ref 135–145)
Total Bilirubin: 0.8 mg/dL (ref 0.3–1.2)
Total Protein: 6.7 g/dL (ref 6.5–8.1)

## 2018-10-28 LAB — CBC WITH DIFFERENTIAL/PLATELET
Abs Immature Granulocytes: 0.13 10*3/uL — ABNORMAL HIGH (ref 0.00–0.07)
Basophils Absolute: 0 10*3/uL (ref 0.0–0.1)
Basophils Relative: 0 %
Eosinophils Absolute: 0 10*3/uL (ref 0.0–0.5)
Eosinophils Relative: 0 %
HCT: 50.7 % (ref 39.0–52.0)
Hemoglobin: 16.6 g/dL (ref 13.0–17.0)
Immature Granulocytes: 1 %
Lymphocytes Relative: 13 %
Lymphs Abs: 2.4 10*3/uL (ref 0.7–4.0)
MCH: 30 pg (ref 26.0–34.0)
MCHC: 32.7 g/dL (ref 30.0–36.0)
MCV: 91.5 fL (ref 80.0–100.0)
Monocytes Absolute: 0.7 10*3/uL (ref 0.1–1.0)
Monocytes Relative: 4 %
Neutro Abs: 15.1 10*3/uL — ABNORMAL HIGH (ref 1.7–7.7)
Neutrophils Relative %: 82 %
Platelets: 353 10*3/uL (ref 150–400)
RBC: 5.54 MIL/uL (ref 4.22–5.81)
RDW: 13.8 % (ref 11.5–15.5)
WBC: 18.4 10*3/uL — ABNORMAL HIGH (ref 4.0–10.5)
nRBC: 0 % (ref 0.0–0.2)

## 2018-10-28 LAB — GLUCOSE, CAPILLARY
Glucose-Capillary: 195 mg/dL — ABNORMAL HIGH (ref 70–99)
Glucose-Capillary: 202 mg/dL — ABNORMAL HIGH (ref 70–99)
Glucose-Capillary: 148 mg/dL — ABNORMAL HIGH (ref 70–99)
Glucose-Capillary: 157 mg/dL — ABNORMAL HIGH (ref 70–99)

## 2018-10-28 LAB — C-REACTIVE PROTEIN: CRP: <0.8 mg/dL (ref <1.0)

## 2018-10-28 LAB — LACTATE DEHYDROGENASE: LDH: 352 U/L — ABNORMAL HIGH (ref 98–192)

## 2018-10-28 LAB — D-DIMER, QUANTITATIVE: D-Dimer, Quant: 0.72 ug/mL-FEU — ABNORMAL HIGH (ref 0.00–0.50)

## 2018-10-28 LAB — MAGNESIUM: Magnesium: 2.7 mg/dL — ABNORMAL HIGH (ref 1.7–2.4)

## 2018-10-28 LAB — FERRITIN: Ferritin: 304 ng/mL (ref 24–336)

## 2018-10-28 LAB — BRAIN NATRIURETIC PEPTIDE: B Natriuretic Peptide: 43.1 pg/mL (ref 0.0–100.0)

## 2018-10-28 MED ORDER — METOPROLOL TARTRATE 25 MG PO TABS
25.0000 mg | ORAL_TABLET | Freq: Once | ORAL | Status: AC
  Administered 2018-10-28: 25 mg via ORAL
  Filled 2018-10-28: qty 1

## 2018-10-28 MED ORDER — METOPROLOL TARTRATE 25 MG PO TABS
50.0000 mg | ORAL_TABLET | Freq: Two times a day (BID) | ORAL | Status: DC
  Administered 2018-10-28 – 2018-11-06 (×18): 50 mg via ORAL
  Filled 2018-10-28 (×18): qty 2

## 2018-10-28 NOTE — Progress Notes (Signed)
Paged Dr. Roger Shelter via Shea Evans and asked MD to change patient's level of care to med surg since patient has had orders since 10-25-18 to discontinue cardiac monitoring and orders for ICU level of care remain.

## 2018-10-28 NOTE — Progress Notes (Signed)
Austin Page called to check on patient for patient's wife who lives with her.  This RN discussed that patient's oxygen needs are down to 3 Liters at this time and that it was reported that patient refused physical therapy today during day shift.  [name] thanked Therapist, sports for update and ended phone call.

## 2018-10-28 NOTE — Progress Notes (Signed)
PROGRESS NOTE                                                                                                                                                                                                             Patient Demographics:    Austin Page, is a 68 y.o. male, DOB - July 06, 1950, RXV:[id number]  Outpatient Primary MD for the patient is Patient, [name]    LOS - 67  Admit date - 10/13/2018    Chief Complaint  Patient presents with   Cough       Brief Narrative  68 year old male with no significant past medical history came into the hospital and was admitted on 09/30/2018 with 3 days of fever, chills, cough, vomiting and weakness in the setting of having a normal Covid contact.  In the ER he was febrile, tachycardic, tachypneic, underwent a CT angiogram which showed peripheral groundglass opacities.  He was Covid positive himself.  He had rapid deterioration of respiratory status requiring 15 L high flow nasal cannula and was transferred to the ICU on 5/25, he was transferred out of ICU on 10/19/2018 and transferred to my care on the same date.   Subjective:   Patient in bed, appears comfortable, denies any headache, no fever, no chest pain or pressure, mild shortness of breath , no abdominal pain. No focal weakness.    Assessment  & Plan :     1. Acute Hypoxic Resp. Failure due to Acute Covid 19 Viral Illness during the ongoing 2020 Covid 19 Pandemic - has received appropriate treatment which include IV steroids, 2 doses of Actemra along with convalescent plasma.    Treatment so far  - Remdesivir started on 5/24 - Received Actemra x 2 on 5/24 and 5/25 - Received convalescent plasma on 5/25 - Continue IV Solu-Medrol was increased again on 10/25/2018  Hypoxia seems to be quite positional however he has shown little signs of improvement, with pruning and sitting up improves temporarily, currently on 15 L  high flow nasal cannula oxygen.  He has been between high flow 30 L to 4 L in the last few days, has been adequately diuresed, on IV steroids which will be continued dose was adjusted on 10/25/2018.  Continue sitting up in chair in the daytime with I-S and flutter valve for pulmonary toiletry and prone in  bed at night.  Unfortunately he continues to remain extremely tenuous and hypoxic.  Prognosis is poor he is DNR.  Daughter was updated by me personally on 10/25/2018.  Continue supportive care as he has already maximized his treatment, no further intubation.  Continue high flow nasal cannula oxygen as needed.  Monitor clinically.  COVID-19 Labs  Recent Labs    10/26/18 0530 10/27/18 0305 10/28/18 0152  DDIMER 1.18* 0.93* 0.72*  FERRITIN 262 285 304  LDH 434* 360* 352*  CRP <0.8 <0.8 <0.8    Lab Results  Component Value Date   SARSCOV2NAA POSITIVE (A) 09/20/2018      Component Value Date/Time   BNP 43.1 10/28/2018 0152    Acute on chronic nonspecific CHF.  Could be mostly diastolic.  Does have bilateral rails on exam, no previous echo on file.  Diurese, low-dose beta-blocker and monitor.  Lasix IV repeated on 10/27/2018, outpatient cardiology follow-up and echocardiogram.    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  Hypokalemia - replaced and stable   Newly Diagnosed DM2 - poor outpatient control due to hyperglycemia A1c of 10.7, Lantus ISS dose was adjusted on 10/25/2018.  Continue to monitor and adjust as needed, DM insulin education has been ordered.  Lab Results  Component Value Date   HGBA1C 10.7 (H) 10/11/2018   CBG (last 3)  Recent Labs    10/27/18 1626 10/27/18 2149 10/28/18 0728  GLUCAP 150* 174* 195*      Condition - Extremely Guarded  Family Communication  : Daughter [name] by me over the phone on 10/25/2018, son il law Jaymin on 10/28/18  Code Status :  DNR  Diet : Regular  Disposition Plan  :  Home once better  Consults  :   None  Procedures  :  None  PUD Prophylaxis : None  DVT Prophylaxis  :  Lovenox   Lab Results  Component Value Date   PLT 353 10/28/2018    Inpatient Medications  Scheduled Meds:  albuterol  2 puff Inhalation Q6H   chlorhexidine  15 mL Mouth Rinse BID   enoxaparin (LOVENOX) injection  40 mg Subcutaneous Q12H   feeding supplement (GLUCERNA SHAKE)  237 mL Oral BID WC   insulin aspart  0-15 Units Subcutaneous TID WC   insulin aspart  0-5 Units Subcutaneous QHS   insulin aspart  6 Units Subcutaneous TID WC   insulin glargine  15 Units Subcutaneous Daily   insulin starter kit- syringes  1 kit Other Once   living well with diabetes book   Does not apply Once   mouth rinse  15 mL Mouth Rinse q12n4p   methylPREDNISolone (SOLU-MEDROL) injection  60 mg Intravenous Daily   metoprolol tartrate  25 mg Oral BID   Continuous Infusions:  PRN Meds:.acetaminophen, alum & mag hydroxide-simeth, guaiFENesin-dextromethorphan, hydrOXYzine, lip balm, [DISCONTINUED] ondansetron **OR** ondansetron (ZOFRAN) IV, senna-docusate, sodium chloride  Antibiotics  :    Anti-infectives (From admission, onward)   Start     Dose/Rate Route Frequency Ordered Stop   10/24/18 2000  ceFEPIme (MAXIPIME) 2 g in sodium chloride 0.9 % 100 mL IVPB  Status:  Discontinued     2 g 200 mL/hr over 30 Minutes Intravenous Every 8 hours 10/24/18 1943 10/25/18 1222   10/13/18 1400  remdesivir 100 mg in sodium chloride 0.9 % 230 mL IVPB     100 mg over 30 Minutes Intravenous Every 24 hours 10/12/18 1113 10/16/18 1930   10/12/18 1400  remdesivir  200 mg in sodium chloride 0.9 % 210 mL IVPB     200 mg over 30 Minutes Intravenous Once 10/12/18 1113 10/12/18 1749       Time Spent in minutes  30   Lala Lund M.D on 10/28/2018 at 11:32 AM  To page go to [email or web address] - password [facility]  [facility] -  Office  [phone]   See all Orders from today for further details    Objective:   Vitals:    10/28/18 0405 10/28/18 0613 10/28/18 0807 10/28/18 0900  BP:  (!) 145/93  (!) 135/97  Pulse: 92 (!) 129    Resp:  (!) 35    Temp:  (!) 97.3 F (36.3 C) (!) 97.5 F (36.4 C)   TempSrc:  Axillary Axillary   SpO2: 96% (!) 84%    Weight:      Height:        Wt Readings from Last 3 Encounters:  09/22/2018 72.6 kg     Intake/Output Summary (Last 24 hours) at 10/28/2018 1132 Last data filed at 10/28/2018 0300 Gross per 24 hour  Intake 600 ml  Output 2475 ml  Net -1875 ml     Physical Exam  Awake Alert, Oriented X 3, No new F.N deficits, Normal affect [place].AT,PERRAL Supple Neck,No JVD, No cervical lymphadenopathy appriciated.  Symmetrical Chest wall movement, Good air movement bilaterally, CTAB RRR,No Gallops, Rubs or new Murmurs, No Parasternal Heave +ve B.Sounds, Abd Soft, No tenderness, No organomegaly appriciated, No rebound - guarding or rigidity. No Cyanosis, Clubbing or edema, No new Rash or bruise    Data Review:    CBC Recent Labs  Lab 10/23/18 0330 10/25/18 0728 10/26/18 0530 10/27/18 0305 10/28/18 0152  WBC 17.4* 16.9* 19.6* 15.8* 18.4*  HGB 15.8 15.8 16.2 16.1 16.6  HCT 47.2 48.6 47.7 48.5 50.7  PLT 326 331 341 333 353  MCV 88.2 91.7 88.7 90.3 91.5  MCH 29.5 29.8 30.1 30.0 30.0  MCHC 33.5 32.5 34.0 33.2 32.7  RDW 13.2 13.6 13.5 13.7 13.8  LYMPHSABS 2.7 1.6 1.8 1.6 2.4  MONOABS 0.7 0.7 0.7 0.7 0.7  EOSABS 0.1 0.0 0.0 0.0 0.0  BASOSABS 0.0 0.0 0.0 0.0 0.0    Chemistries  Recent Labs  Lab 10/22/18 0320 10/23/18 0330 10/24/18 0430 10/25/18 0118 10/26/18 0530 10/27/18 0305 10/28/18 0152  NA 132* 131* 132* 133* 133* 135 133*  K 3.3* 3.8 3.8 3.9 4.0 4.1 3.8  CL 88* 89* 92* 92* 94* 93* 90*  CO2 _0 32 30  GLUCOSE 31* 72 121* 94 104* 114* 93  BUN 35* 27* 32* 38* 42* 42* 45*  CREATININE 0.67 0.66 0.64 0.79 0.71 0.79 0.69  CALCIUM 8.8* 8.9 8.9 9.0 9.1 9.1 8.9  MG 2.5* 2.6*  --   --  2.5* 2.5* 2.7*  AST 34 35  --  36 35 27 25  ALT 25 27   --  39 43 41 41  ALKPHOS 73 77  --  70 75 70 71  BILITOT 0.7 0.8  --  0.8 1.0 1.0 0.8   ------------------------------------------------------------------------------------------------------------------ No results for input(s): CHOL, HDL, LDLCALC, TRIG, CHOLHDL, LDLDIRECT in the last 72 hours.  Lab Results  Component Value Date   HGBA1C 10.7 (H) 10/11/2018   ------------------------------------------------------------------------------------------------------------------ No results for input(s): TSH, T4TOTAL, T3FREE, THYROIDAB in the last 72 hours.  Invalid input(s): FREET3  Cardiac Enzymes No results for input(s): CKMB, TROPONINI, MYOGLOBIN in the last 168 hours.  Invalid input(s): CK ------------------------------------------------------------------------------------------------------------------  Component Value Date/Time   BNP 43.1 10/28/2018 0152    Micro Results No results found for this or any previous visit (from the past 240 hour(s)).  Radiology Reports Ct Chest Wo Contrast  Result Date: 10/26/2018 CLINICAL DATA:  Acute respiratory illness, COVID-19 positive. EXAM: CT CHEST WITHOUT CONTRAST TECHNIQUE: Multidetector CT imaging of the chest was performed following the standard protocol without IV contrast. COMPARISON:  10/11/2018 FINDINGS: Cardiovascular: Heart is normal size. Calcified plaque over the left main, left anterior descending and lateral circumflex coronary arteries. Calcified plaque over the thoracic aorta. Remaining vascular structures are unremarkable. Mediastinum/Nodes: No significant mediastinal or hilar adenopathy. Remaining mediastinal structures are unremarkable. Lungs/Pleura: Lungs are adequately inflated demonstrate moderate interval worsening of a patchy bilateral airspace process demonstrating ground-glass and crazy paving features and is mid to lower lung predominant. No effusion. Airways unremarkable. Upper Abdomen: No acute findings. Calcified plaque  over the abdominal aorta. Musculoskeletal: Mild degenerative change of the spine. IMPRESSION: Interval worsening of bilateral multifocal airspace process worse over the mid to lower lungs compatible with worsening known viral pneumonia. Aortic Atherosclerosis (ICD10-I70.0). Atherosclerotic coronary artery disease. Electronically Signed   By: Marin Olp M.D.   On: 10/26/2018 15:43   Ct Angio Chest Pe W And/or Wo Contrast  Result Date: 10/11/2018 CLINICAL DATA:  68 y/o  M; cough, fever, chills.  COVID-19 positive. EXAM: CT ANGIOGRAPHY CHEST WITH CONTRAST TECHNIQUE: Multidetector CT imaging of the chest was performed using the standard protocol during bolus administration of intravenous contrast. Multiplanar CT image reconstructions and MIPs were obtained to evaluate the vascular anatomy. CONTRAST:  166m OMNIPAQUE IOHEXOL 300 MG/ML  SOLN COMPARISON:  09/29/2018 chest radiograph FINDINGS: Cardiovascular: Mild respiratory motion artifact. Satisfactory opacification of the pulmonary arteries to the segmental level. No evidence of pulmonary embolism. Normal heart size. No pericardial effusion. Mild aortic and moderate coronary artery calcific atherosclerosis. Mediastinum/Nodes: No enlarged mediastinal, hilar, or axillary lymph nodes. Thyroid gland, trachea, and esophagus demonstrate no significant findings. Lungs/Pleura: Multiple peripheral ground-glass opacities with the largest confluent opacity in the right upper lobe. The right upper lobe demonstrates intra lobular septal thickening "crazy paving" pattern. No pleural effusion or pneumothorax. Upper Abdomen: No acute abnormality. Musculoskeletal: No chest wall abnormality. No acute or significant osseous findings. Review of the MIP images confirms the above findings. IMPRESSION: 1. Mild respiratory motion artifact. No pulmonary embolus identified. 2. There are a spectrum of findings in the lungs which can be seen with acute atypical infection (as well as other  non-infectious etiologies). In particular, viral pneumonia (including COVID-19) should be considered in the appropriate clinical setting. Critical Value/emergent results were called by telephone at the time of interpretation on 10/11/2018 at 3:08 am to PA MLafferty, who verbally acknowledged these results. Electronically Signed   By: LKristine GarbeM.D.   On: 10/11/2018 03:21   Dg Chest Port 1 View  Result Date: 10/24/2018 CLINICAL DATA:  Shortness of breath.  COVID-19. EXAM: PORTABLE CHEST 1 VIEW COMPARISON:  Chest x-rays dated 10/16/2018, 10/12/2018 and 09/24/2018 FINDINGS: The bilateral pulmonary infiltrates have progressed. Heart size and vascularity are normal. No effusions. No significant bone abnormality. Aortic atherosclerosis. IMPRESSION: Progression of the bilateral hazy pulmonary infiltrates since the prior study of 05/20 Aortic Atherosclerosis (ICD10-I70.0). Electronically Signed   By: JLorriane ShireM.D.   On: 10/24/2018 08:46   Dg Chest Port 1 View  Result Date: 10/19/2018 CLINICAL DATA:  Shortness of breath EXAM: PORTABLE CHEST 1 VIEW COMPARISON:  10/16/2018 FINDINGS: Multifocal airspace opacities,  improved in the upper lungs bilaterally, persistent at the lung bases. This appearance favors improving multifocal pneumonia. No pleural effusion or pneumothorax. The heart is normal in size. IMPRESSION: Improving multifocal pneumonia, as above. Electronically Signed   By: Julian Hy M.D.   On: 10/19/2018 10:21   Dg Chest Port 1 View  Result Date: 10/16/2018 CLINICAL DATA:  Dyspnea.COVID19 EXAM: PORTABLE CHEST 1 VIEW COMPARISON:  10/12/2018 FINDINGS: Heart size appears normal. No pleural effusion identified. Diffuse hazy opacities are noted in the left lung. Airspace consolidation within the right upper lobe and left base is improved from previous exam. IMPRESSION: 1. Persistent left lung hazy opacities compatible with inflammation/infection. 2. Improving consolidation within  the right upper lobe and left base. Electronically Signed   By: Kerby Moors M.D.   On: 10/16/2018 09:15   Dg Chest Port 1 View  Result Date: 10/12/2018 CLINICAL DATA:  Weakness and fever, history of COVID-19 EXAM: PORTABLE CHEST 1 VIEW COMPARISON:  10/11/2018 FINDINGS: Cardiac shadow is stable. Patchy infiltrates are seen bilaterally particularly in the right upper lobe consistent with that seen on recent CT examination and consistent with the patient's given clinical history. No sizable effusion is seen. No bony abnormality is noted. Elevation the right hemidiaphragm is again noted. IMPRESSION: Patchy infiltrates predominately within the right upper lobe consistent with the given clinical history of COVID-19 positivity. Electronically Signed   By: Inez Catalina M.D.   On: 10/12/2018 03:59   Dg Chest Portable 1 View  Result Date: 09/26/2018 CLINICAL DATA:  Symptoms of possible COVID-19 EXAM: PORTABLE CHEST 1 VIEW COMPARISON:  None. FINDINGS: The heart size and mediastinal contours are within normal limits. Both lungs are clear. The visualized skeletal structures are unremarkable. IMPRESSION: No active disease. Electronically Signed   By: Ulyses Jarred M.D.   On: 09/30/2018 22:55

## 2018-10-28 NOTE — Progress Notes (Signed)
Called family this morning, to help with communication with the patient. Gave update on patients status. Doctor walked in and spoke to family also. Patient is resting comfortably. Nurse was able to get patients oxygen down to 3L on high flow. Patient tolerating well.

## 2018-10-28 NOTE — Progress Notes (Signed)
[facility] and AHF Team  ReDS Research Project   Patient #: [id number]  ReDS Measurement  Right: 42%  Left: 38%

## 2018-10-29 LAB — CBC WITH DIFFERENTIAL/PLATELET
Abs Immature Granulocytes: 0.07 10*3/uL (ref 0.00–0.07)
Basophils Absolute: 0 10*3/uL (ref 0.0–0.1)
Basophils Relative: 0 %
Eosinophils Absolute: 0 10*3/uL (ref 0.0–0.5)
Eosinophils Relative: 0 %
HCT: 47.7 % (ref 39.0–52.0)
Hemoglobin: 16 g/dL (ref 13.0–17.0)
Immature Granulocytes: 1 %
Lymphocytes Relative: 7 %
Lymphs Abs: 1.1 10*3/uL (ref 0.7–4.0)
MCH: 30.5 pg (ref 26.0–34.0)
MCHC: 33.5 g/dL (ref 30.0–36.0)
MCV: 91 fL (ref 80.0–100.0)
Monocytes Absolute: 0.7 10*3/uL (ref 0.1–1.0)
Monocytes Relative: 4 %
Neutro Abs: 13.5 10*3/uL — ABNORMAL HIGH (ref 1.7–7.7)
Neutrophils Relative %: 88 %
Platelets: 324 10*3/uL (ref 150–400)
RBC: 5.24 MIL/uL (ref 4.22–5.81)
RDW: 13.8 % (ref 11.5–15.5)
WBC: 15.3 10*3/uL — ABNORMAL HIGH (ref 4.0–10.5)
nRBC: 0 % (ref 0.0–0.2)

## 2018-10-29 LAB — COMPREHENSIVE METABOLIC PANEL
ALT: 35 U/L (ref 0–44)
AST: 22 U/L (ref 15–41)
Albumin: 3 g/dL — ABNORMAL LOW (ref 3.5–5.0)
Alkaline Phosphatase: 82 U/L (ref 38–126)
Anion gap: 14 (ref 5–15)
BUN: 43 mg/dL — ABNORMAL HIGH (ref 8–23)
CO2: 26 mmol/L (ref 22–32)
Calcium: 9.3 mg/dL (ref 8.9–10.3)
Chloride: 92 mmol/L — ABNORMAL LOW (ref 98–111)
Creatinine, Ser: 0.68 mg/dL (ref 0.61–1.24)
GFR calc Af Amer: >60 mL/min (ref >60)
GFR calc non Af Amer: >60 mL/min (ref >60)
Glucose, Bld: 238 mg/dL — ABNORMAL HIGH (ref 70–99)
Potassium: 4 mmol/L (ref 3.5–5.1)
Sodium: 132 mmol/L — ABNORMAL LOW (ref 135–145)
Total Bilirubin: 1.2 mg/dL (ref 0.3–1.2)
Total Protein: 5.8 g/dL — ABNORMAL LOW (ref 6.5–8.1)

## 2018-10-29 LAB — FERRITIN: Ferritin: 307 ng/mL (ref 24–336)

## 2018-10-29 LAB — D-DIMER, QUANTITATIVE: D-Dimer, Quant: 0.4 ug/mL-FEU (ref 0.00–0.50)

## 2018-10-29 LAB — MAGNESIUM: Magnesium: 2.3 mg/dL (ref 1.7–2.4)

## 2018-10-29 LAB — GLUCOSE, CAPILLARY
Glucose-Capillary: 180 mg/dL — ABNORMAL HIGH (ref 70–99)
Glucose-Capillary: 366 mg/dL — ABNORMAL HIGH (ref 70–99)
Glucose-Capillary: 118 mg/dL — ABNORMAL HIGH (ref 70–99)

## 2018-10-29 LAB — BRAIN NATRIURETIC PEPTIDE: B Natriuretic Peptide: 110.3 pg/mL — ABNORMAL HIGH (ref 0.0–100.0)

## 2018-10-29 LAB — LACTATE DEHYDROGENASE: LDH: 298 U/L — ABNORMAL HIGH (ref 98–192)

## 2018-10-29 LAB — C-REACTIVE PROTEIN: CRP: <0.8 mg/dL (ref <1.0)

## 2018-10-29 MED ORDER — FUROSEMIDE 10 MG/ML IJ SOLN
40.0000 mg | Freq: Once | INTRAMUSCULAR | Status: AC
  Administered 2018-10-29: 40 mg via INTRAVENOUS
  Filled 2018-10-29: qty 4

## 2018-10-29 MED ORDER — CLONAZEPAM 0.5 MG PO TBDP
0.5000 mg | ORAL_TABLET | Freq: Two times a day (BID) | ORAL | Status: DC
  Administered 2018-10-29 – 2018-11-07 (×19): 0.5 mg via ORAL
  Filled 2018-10-29 (×19): qty 1

## 2018-10-29 MED ORDER — HYDROCORTISONE (PERIANAL) 2.5 % EX CREA
TOPICAL_CREAM | Freq: Three times a day (TID) | CUTANEOUS | Status: DC
  Administered 2018-10-29 – 2018-11-02 (×13): via RECTAL
  Filled 2018-10-29: qty 28.35

## 2018-10-29 NOTE — Progress Notes (Signed)
Dr Candiss Norse at bedside, pt on maxed above 15L HFNC, sats at 90%, RT paged per MD request. rsp 28, md request in 1 hour.

## 2018-10-29 NOTE — Progress Notes (Signed)
Pt on 15L HFNC, eating a banana, o2 sats 91%, will continue to monitor.

## 2018-10-29 NOTE — Progress Notes (Signed)
[facility] Department and AHF Team  ReDS Research Project   Patient #: [id number]  ReDS Measurement  Right: 33%  Left: low quality x3

## 2018-10-29 NOTE — Progress Notes (Signed)
PROGRESS NOTE                                                                                                                                                                                                             Patient Demographics:    Austin Page, is a 68 y.o. male, DOB - [birth year], TLX:[id number]  Outpatient Primary MD for the patient is Patient, [name]    LOS - 18  Admit date - 10/08/2018    Chief Complaint  Patient presents with   Cough       Brief Narrative  68 year old male with no significant past medical history came into the hospital and was admitted on 10/06/2018 with 3 days of fever, chills, cough, vomiting and weakness in the setting of having a normal Covid contact.  In the ER he was febrile, tachycardic, tachypneic, underwent a CT angiogram which showed peripheral groundglass opacities.  He was Covid positive himself.  He had rapid deterioration of respiratory status requiring 15 L high flow nasal cannula and was transferred to the ICU on 5/25, he was transferred out of ICU on 10/19/2018 and transferred to my care on the same date.   Subjective:   Patient in bed, appears comfortable, denies any headache, no fever, no chest pain or pressure, no shortness of breath , no abdominal pain. No focal weakness.   Assessment  & Plan :     1. Acute Hypoxic Resp. Failure due to Acute Covid 19 Viral Illness during the ongoing 2020 Covid 19 Pandemic - has received appropriate treatment which include IV steroids, 2 doses of Actemra along with convalescent plasma.    Treatment so far  - Remdesivir started on 5/24 - Received Actemra x 2 on 5/24 and 5/25 - Received convalescent plasma on 5/25 - Continue IV Solu-Medrol was increased again on 10/25/2018  Hypoxia seems to be quite positional however he has shown little signs of improvement, with pruning and sitting up improves temporarily, currently on 15 L high  flow nasal cannula oxygen.  He has been between high flow 30 L to 4 L in the last few days, has been adequately diuresed,  on IV steroids which will be continued dose was adjusted on 10/25/2018.  Continue sitting up in chair in the daytime with I-S and flutter valve for pulmonary toiletry and prone in bed at night.  Unfortunately he continues to remain extremely tenuous and hypoxic.  Prognosis is poor he is DNR.  Daughter was updated by me personally on 10/25/2018.  Continue supportive care as he has already maximized his treatment, no further intubation.  Continue high flow nasal cannula oxygen as needed.  Monitor clinically.  COVID-19 Labs  Recent Labs    10/27/18 0305 10/28/18 0152 10/29/18 0309  DDIMER 0.93* 0.72* 0.40  FERRITIN 285 304 307  LDH 360* 352* 298*  CRP <0.8 <0.8 <0.8    Lab Results  Component Value Date   SARSCOV2NAA POSITIVE (A) 09/27/2018      Component Value Date/Time   BNP 110.3 (H) 10/29/2018 0309    Acute on chronic nonspecific CHF.  Could be mostly diastolic.  Does have bilateral rails on exam, no previous echo on file.  Diurese, low-dose beta-blocker and monitor.  Lasix IV repeated on 10/27/2018, outpatient cardiology follow-up and echocardiogram.    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  Hypokalemia - replaced and stable   Rectal pain while having bowel movement.  Anusol cream.  Outpatient GI follow-up.    Mild anxiety.  Low-dose Klonopin.    Newly Diagnosed DM2 - poor outpatient control due to hyperglycemia A1c of 10.7, Lantus ISS dose was adjusted on 10/25/2018.  Continue to monitor and adjust as needed, DM insulin education has been ordered.  Lab Results  Component Value Date   HGBA1C 10.7 (H) 10/11/2018   CBG (last 3)  Recent Labs    10/28/18 1602 10/28/18 2032 10/29/18 0746  GLUCAP 148* 157* 180*      Condition - Extremely Guarded  Family Communication  : Daughter [name] by me over the phone on 10/25/2018, son il  law Jaymin on 10/28/18, 10/29/18  Code Status :  DNR  Diet : Regular  Disposition Plan  :  Home once better  Consults  :  None  Procedures  :  None  PUD Prophylaxis : None  DVT Prophylaxis  :  Lovenox   Lab Results  Component Value Date   PLT 324 10/29/2018    Inpatient Medications  Scheduled Meds:  albuterol  2 puff Inhalation Q6H   chlorhexidine  15 mL Mouth Rinse BID   enoxaparin (LOVENOX) injection  40 mg Subcutaneous Q12H   feeding supplement (GLUCERNA SHAKE)  237 mL Oral BID WC   insulin aspart  0-15 Units Subcutaneous TID WC   insulin aspart  0-5 Units Subcutaneous QHS   insulin aspart  6 Units Subcutaneous TID WC   insulin glargine  15 Units Subcutaneous Daily   insulin starter kit- syringes  1 kit Other Once   living well with diabetes book   Does not apply Once   mouth rinse  15 mL Mouth Rinse q12n4p   methylPREDNISolone (SOLU-MEDROL) injection  60 mg Intravenous Daily   metoprolol tartrate  50 mg Oral BID   Continuous Infusions:  PRN Meds:.acetaminophen, alum & mag hydroxide-simeth, guaiFENesin-dextromethorphan, hydrOXYzine, lip balm, [DISCONTINUED] ondansetron **OR** ondansetron (ZOFRAN) IV, senna-docusate, sodium chloride  Antibiotics  :    Anti-infectives (From admission, onward)   Start     Dose/Rate Route Frequency Ordered Stop   10/24/18 2000  ceFEPIme (MAXIPIME) 2 g in sodium chloride 0.9 % 100 mL IVPB  Status:  Discontinued  2 g 200 mL/hr over 30 Minutes Intravenous Every 8 hours 10/24/18 1943 10/25/18 1222   10/13/18 1400  remdesivir 100 mg in sodium chloride 0.9 % 230 mL IVPB     100 mg over 30 Minutes Intravenous Every 24 hours 10/12/18 1113 10/16/18 1930   10/12/18 1400  remdesivir 200 mg in sodium chloride 0.9 % 210 mL IVPB     200 mg over 30 Minutes Intravenous Once 10/12/18 1113 10/12/18 1749       Time Spent in minutes  30   Lala Lund M.D on 10/29/2018 at 10:44 AM  To page go to [email or web address] - password  [facility]  [facility] -  Office  [phone]   See all Orders from today for further details    Objective:   Vitals:   10/29/18 0502 10/29/18 0555 10/29/18 0700 10/29/18 0748  BP: 129/82   137/75  Pulse: 99 (!) 106  (!) 116  Resp: (!) 28   20  Temp: 97.9 F (36.6 C)  (!) 92 F (33.3 C) (!) 97.4 F (36.3 C)  TempSrc: Oral   Axillary  SpO2: 92% (!) 88%  96%  Weight:      Height:        Wt Readings from Last 3 Encounters:  09/30/2018 72.6 kg     Intake/Output Summary (Last 24 hours) at 10/29/2018 1044 Last data filed at 10/29/2018 1000 Gross per 24 hour  Intake 360 ml  Output 640 ml  Net -280 ml     Physical Exam  Awake Alert, Oriented X 3, No new F.N deficits, Normal affect [place].AT,PERRAL Supple Neck,No JVD, No cervical lymphadenopathy appriciated.  Symmetrical Chest wall movement, Good air movement bilaterally, CTAB RRR,No Gallops, Rubs or new Murmurs, No Parasternal Heave +ve B.Sounds, Abd Soft, No tenderness, No organomegaly appriciated, No rebound - guarding or rigidity. No Cyanosis, Clubbing or edema, No new Rash or bruise    Data Review:    CBC Recent Labs  Lab 10/25/18 0728 10/26/18 0530 10/27/18 0305 10/28/18 0152 10/29/18 0309  WBC 16.9* 19.6* 15.8* 18.4* 15.3*  HGB 15.8 16.2 16.1 16.6 16.0  HCT 48.6 47.7 48.5 50.7 47.7  PLT 331 341 333 353 324  MCV 91.7 88.7 90.3 91.5 91.0  MCH 29.8 30.1 30.0 30.0 30.5  MCHC 32.5 34.0 33.2 32.7 33.5  RDW 13.6 13.5 13.7 13.8 13.8  LYMPHSABS 1.6 1.8 1.6 2.4 1.1  MONOABS 0.7 0.7 0.7 0.7 0.7  EOSABS 0.0 0.0 0.0 0.0 0.0  BASOSABS 0.0 0.0 0.0 0.0 0.0    Chemistries  Recent Labs  Lab 10/23/18 0330  10/25/18 0118 10/26/18 0530 10/27/18 0305 10/28/18 0152 10/29/18 0309  NA 131*   < > 133* 133* 135 133* 132*  K 3.8   < > 3.9 4.0 4.1 3.8 4.0  CL 89*   < > 92* 94* 93* 90* 92*  CO2 28   < > 31 28 32 30 26  GLUCOSE 72   < > 94 104* 114* 93 238*  BUN 27*   < > 38* 42* 42* 45* 43*  CREATININE 0.66   < >  0.79 0.71 0.79 0.69 0.68  CALCIUM 8.9   < > 9.0 9.1 9.1 8.9 9.3  MG 2.6*  --   --  2.5* 2.5* 2.7* 2.3  AST 35  --  36 35 _0 ALT 27  --  39 43 41 41 35  ALKPHOS 77  --  70 75 70 71 82  BILITOT  0.8  --  0.8 1.0 1.0 0.8 1.2   < > = values in this interval not displayed.   ------------------------------------------------------------------------------------------------------------------ No results for input(s): CHOL, HDL, LDLCALC, TRIG, CHOLHDL, LDLDIRECT in the last 72 hours.  Lab Results  Component Value Date   HGBA1C 10.7 (H) 10/11/2018   ------------------------------------------------------------------------------------------------------------------ No results for input(s): TSH, T4TOTAL, T3FREE, THYROIDAB in the last 72 hours.  Invalid input(s): FREET3  Cardiac Enzymes No results for input(s): CKMB, TROPONINI, MYOGLOBIN in the last 168 hours.  Invalid input(s): CK ------------------------------------------------------------------------------------------------------------------    Component Value Date/Time   BNP 110.3 (H) 10/29/2018 0309    Micro Results No results found for this or any previous visit (from the past 240 hour(s)).  Radiology Reports Ct Chest Wo Contrast  Result Date: 10/26/2018 CLINICAL DATA:  Acute respiratory illness, COVID-19 positive. EXAM: CT CHEST WITHOUT CONTRAST TECHNIQUE: Multidetector CT imaging of the chest was performed following the standard protocol without IV contrast. COMPARISON:  10/11/2018 FINDINGS: Cardiovascular: Heart is normal size. Calcified plaque over the left main, left anterior descending and lateral circumflex coronary arteries. Calcified plaque over the thoracic aorta. Remaining vascular structures are unremarkable. Mediastinum/Nodes: No significant mediastinal or hilar adenopathy. Remaining mediastinal structures are unremarkable. Lungs/Pleura: Lungs are adequately inflated demonstrate moderate interval worsening of a patchy  bilateral airspace process demonstrating ground-glass and crazy paving features and is mid to lower lung predominant. No effusion. Airways unremarkable. Upper Abdomen: No acute findings. Calcified plaque over the abdominal aorta. Musculoskeletal: Mild degenerative change of the spine. IMPRESSION: Interval worsening of bilateral multifocal airspace process worse over the mid to lower lungs compatible with worsening known viral pneumonia. Aortic Atherosclerosis (ICD10-I70.0). Atherosclerotic coronary artery disease. Electronically Signed   By: Marin Olp M.D.   On: 10/26/2018 15:43   Ct Angio Chest Pe W And/or Wo Contrast  Result Date: 10/11/2018 CLINICAL DATA:  68 y/o  M; cough, fever, chills.  COVID-19 positive. EXAM: CT ANGIOGRAPHY CHEST WITH CONTRAST TECHNIQUE: Multidetector CT imaging of the chest was performed using the standard protocol during bolus administration of intravenous contrast. Multiplanar CT image reconstructions and MIPs were obtained to evaluate the vascular anatomy. CONTRAST:  135m OMNIPAQUE IOHEXOL 300 MG/ML  SOLN COMPARISON:  09/23/2018 chest radiograph FINDINGS: Cardiovascular: Mild respiratory motion artifact. Satisfactory opacification of the pulmonary arteries to the segmental level. No evidence of pulmonary embolism. Normal heart size. No pericardial effusion. Mild aortic and moderate coronary artery calcific atherosclerosis. Mediastinum/Nodes: No enlarged mediastinal, hilar, or axillary lymph nodes. Thyroid gland, trachea, and esophagus demonstrate no significant findings. Lungs/Pleura: Multiple peripheral ground-glass opacities with the largest confluent opacity in the right upper lobe. The right upper lobe demonstrates intra lobular septal thickening "crazy paving" pattern. No pleural effusion or pneumothorax. Upper Abdomen: No acute abnormality. Musculoskeletal: No chest wall abnormality. No acute or significant osseous findings. Review of the MIP images confirms the above  findings. IMPRESSION: 1. Mild respiratory motion artifact. No pulmonary embolus identified. 2. There are a spectrum of findings in the lungs which can be seen with acute atypical infection (as well as other non-infectious etiologies). In particular, viral pneumonia (including COVID-19) should be considered in the appropriate clinical setting. Critical Value/emergent results were called by telephone at the time of interpretation on 10/11/2018 at 3:08 am to [place], who verbally acknowledged these results. Electronically Signed   By: LKristine GarbeM.D.   On: 10/11/2018 03:21   Dg Chest Port 1 View  Result Date: 10/24/2018 CLINICAL DATA:  Shortness of breath.  COVID-19.  EXAM: PORTABLE CHEST 1 VIEW COMPARISON:  Chest x-rays dated 10/16/2018, 10/12/2018 and 10/04/2018 FINDINGS: The bilateral pulmonary infiltrates have progressed. Heart size and vascularity are normal. No effusions. No significant bone abnormality. Aortic atherosclerosis. IMPRESSION: Progression of the bilateral hazy pulmonary infiltrates since the prior study of 05/20 Aortic Atherosclerosis (ICD10-I70.0). Electronically Signed   By: Lorriane Shire M.D.   On: 10/24/2018 08:46   Dg Chest Port 1 View  Result Date: 10/19/2018 CLINICAL DATA:  Shortness of breath EXAM: PORTABLE CHEST 1 VIEW COMPARISON:  10/16/2018 FINDINGS: Multifocal airspace opacities, improved in the upper lungs bilaterally, persistent at the lung bases. This appearance favors improving multifocal pneumonia. No pleural effusion or pneumothorax. The heart is normal in size. IMPRESSION: Improving multifocal pneumonia, as above. Electronically Signed   By: Julian Hy M.D.   On: 10/19/2018 10:21   Dg Chest Port 1 View  Result Date: 10/16/2018 CLINICAL DATA:  Dyspnea.COVID19 EXAM: PORTABLE CHEST 1 VIEW COMPARISON:  10/12/2018 FINDINGS: Heart size appears normal. No pleural effusion identified. Diffuse hazy opacities are noted in the left lung. Airspace  consolidation within the right upper lobe and left base is improved from previous exam. IMPRESSION: 1. Persistent left lung hazy opacities compatible with inflammation/infection. 2. Improving consolidation within the right upper lobe and left base. Electronically Signed   By: Kerby Moors M.D.   On: 10/16/2018 09:15   Dg Chest Port 1 View  Result Date: 10/12/2018 CLINICAL DATA:  Weakness and fever, history of COVID-19 EXAM: PORTABLE CHEST 1 VIEW COMPARISON:  10/11/2018 FINDINGS: Cardiac shadow is stable. Patchy infiltrates are seen bilaterally particularly in the right upper lobe consistent with that seen on recent CT examination and consistent with the patient's given clinical history. No sizable effusion is seen. No bony abnormality is noted. Elevation the right hemidiaphragm is again noted. IMPRESSION: Patchy infiltrates predominately within the right upper lobe consistent with the given clinical history of COVID-19 positivity. Electronically Signed   By: Inez Catalina M.D.   On: 10/12/2018 03:59   Dg Chest Portable 1 View  Result Date: 09/23/2018 CLINICAL DATA:  Symptoms of possible COVID-19 EXAM: PORTABLE CHEST 1 VIEW COMPARISON:  None. FINDINGS: The heart size and mediastinal contours are within normal limits. Both lungs are clear. The visualized skeletal structures are unremarkable. IMPRESSION: No active disease. Electronically Signed   By: Ulyses Jarred M.D.   On: 09/23/2018 22:55

## 2018-10-29 NOTE — Progress Notes (Signed)
Inpatient Diabetes Program Recommendations  AACE/ADA: New Consensus Statement on Inpatient Glycemic Control (2015)  Target Ranges:  Prepandial:   less than 140 mg/dL      Peak postprandial:   less than 180 mg/dL (1-2 hours)      Critically ill patients:  140 - 180 mg/dL   Lab Results  Component Value Date   GLUCAP 366 (H) 10/29/2018   HGBA1C 10.7 (H) 10/11/2018    Review of Glycemic Control  Post-prandial blood sugar > 300 mg/dL. Titrate Novolog meal coverage insulin. On Solumedrol 60 mg QD  Inpatient Diabetes Program Recommendations:     Increase Novolog to 8 units tidwc if pt eats > 50% meal.  Will continue to follow.  Thank you. Lorenda Peck, RD, LDN, CDE Inpatient Diabetes Coordinator [phone]

## 2018-10-29 NOTE — Progress Notes (Signed)
Pt had an episode of desaturation this am, was placed on non rebreather at 15L around 11 am, Md notified and attempots to wean pt have been unsuccessful. Pt  Grunting with resperations, resp 36-42, o2 at 67 percent because pt keeps removing mask due to sob and pt stating hes hot, pt  quickly desats to 60s. md request pt be placed on 15L HFNC and let him know o2 sats in 15 mins.

## 2018-10-30 ENCOUNTER — Inpatient Hospital Stay (HOSPITAL_COMMUNITY): Payer: BC Managed Care – PPO

## 2018-10-30 LAB — GLUCOSE, CAPILLARY
Glucose-Capillary: 203 mg/dL — ABNORMAL HIGH (ref 70–99)
Glucose-Capillary: 188 mg/dL — ABNORMAL HIGH (ref 70–99)
Glucose-Capillary: 235 mg/dL — ABNORMAL HIGH (ref 70–99)
Glucose-Capillary: 141 mg/dL — ABNORMAL HIGH (ref 70–99)

## 2018-10-30 LAB — COMPREHENSIVE METABOLIC PANEL
ALT: 44 U/L (ref 0–44)
AST: 32 U/L (ref 15–41)
Albumin: 3 g/dL — ABNORMAL LOW (ref 3.5–5.0)
Alkaline Phosphatase: 70 U/L (ref 38–126)
Anion gap: 11 (ref 5–15)
BUN: 44 mg/dL — ABNORMAL HIGH (ref 8–23)
CO2: 32 mmol/L (ref 22–32)
Calcium: 8.8 mg/dL — ABNORMAL LOW (ref 8.9–10.3)
Chloride: 92 mmol/L — ABNORMAL LOW (ref 98–111)
Creatinine, Ser: 0.72 mg/dL (ref 0.61–1.24)
GFR calc Af Amer: >60 mL/min (ref >60)
GFR calc non Af Amer: >60 mL/min (ref >60)
Glucose, Bld: 137 mg/dL — ABNORMAL HIGH (ref 70–99)
Potassium: 3.8 mmol/L (ref 3.5–5.1)
Sodium: 135 mmol/L (ref 135–145)
Total Bilirubin: 0.6 mg/dL (ref 0.3–1.2)
Total Protein: 5.8 g/dL — ABNORMAL LOW (ref 6.5–8.1)

## 2018-10-30 LAB — CBC
HCT: 49.6 % (ref 39.0–52.0)
Hemoglobin: 16.4 g/dL (ref 13.0–17.0)
MCH: 29.9 pg (ref 26.0–34.0)
MCHC: 33.1 g/dL (ref 30.0–36.0)
MCV: 90.5 fL (ref 80.0–100.0)
Platelets: 296 10*3/uL (ref 150–400)
RBC: 5.48 MIL/uL (ref 4.22–5.81)
RDW: 13.8 % (ref 11.5–15.5)
WBC: 14.6 10*3/uL — ABNORMAL HIGH (ref 4.0–10.5)
nRBC: 0 % (ref 0.0–0.2)

## 2018-10-30 LAB — MAGNESIUM: Magnesium: 2.5 mg/dL — ABNORMAL HIGH (ref 1.7–2.4)

## 2018-10-30 LAB — BRAIN NATRIURETIC PEPTIDE: B Natriuretic Peptide: 111.8 pg/mL — ABNORMAL HIGH (ref 0.0–100.0)

## 2018-10-30 LAB — LACTATE DEHYDROGENASE: LDH: 395 U/L — ABNORMAL HIGH (ref 98–192)

## 2018-10-30 MED ORDER — ISOSORBIDE MONONITRATE ER 60 MG PO TB24
60.0000 mg | ORAL_TABLET | Freq: Every day | ORAL | Status: DC
  Administered 2018-10-30 – 2018-11-07 (×9): 60 mg via ORAL
  Filled 2018-10-30 (×10): qty 1

## 2018-10-30 MED ORDER — FUROSEMIDE 10 MG/ML IJ SOLN: 20.0000 mg | Freq: Once | INTRAMUSCULAR | Status: DC

## 2018-10-30 MED ORDER — FUROSEMIDE 10 MG/ML IJ SOLN
40.0000 mg | Freq: Once | INTRAMUSCULAR | Status: AC
  Administered 2018-10-30: 40 mg via INTRAVENOUS
  Filled 2018-10-30: qty 4

## 2018-10-30 MED ORDER — ALBUTEROL SULFATE HFA 108 (90 BASE) MCG/ACT IN AERS
2.0000 | INHALATION_SPRAY | Freq: Four times a day (QID) | RESPIRATORY_TRACT | Status: DC | PRN
  Administered 2018-10-31 – 2018-11-06 (×10): 2 via RESPIRATORY_TRACT
  Filled 2018-10-30: qty 6.7

## 2018-10-30 MED ORDER — POTASSIUM CHLORIDE CRYS ER 20 MEQ PO TBCR
40.0000 meq | EXTENDED_RELEASE_TABLET | Freq: Once | ORAL | Status: AC
  Administered 2018-10-30: 40 meq via ORAL
  Filled 2018-10-30: qty 2

## 2018-10-30 NOTE — Progress Notes (Signed)
[facility] Department and AHF Team  ReDS Research Project   Patient #: [id number]  ReDS Measurement  Right: 30%  Left: low quality x 3

## 2018-10-30 NOTE — Progress Notes (Signed)
PROGRESS NOTE                                                                                                                                                                                                             Patient Demographics:    Austin Page, is a 68 y.o. male, DOB - 05-10-1951, VQX:[id number]  Outpatient Primary MD for the patient is Patient, [name]    LOS - 19  Admit date - 09/28/2018    Chief Complaint  Patient presents with  . Cough       Brief Narrative  68 year old male with no significant past medical history came into the hospital and was admitted on 09/28/2018 with 3 days of fever, chills, cough, vomiting and weakness in the setting of having a normal Covid contact.  In the ER he was febrile, tachycardic, tachypneic, underwent a CT angiogram which showed peripheral groundglass opacities.  He was Covid positive himself.  He had rapid deterioration of respiratory status requiring 15 L high flow nasal cannula and was transferred to the ICU on 5/25, he was transferred out of ICU on 10/19/2018 and transferred to my care on the same date.   Subjective:   Patient in bed, appears comfortable, denies any headache, no fever, no chest pain or pressure, no shortness of breath , no abdominal pain. No focal weakness.   Assessment  & Plan :     1. Acute Hypoxic Resp. Failure due to Acute Covid 19 Viral Illness during the ongoing 2020 Covid 19 Pandemic - has received appropriate treatment which include IV steroids, 2 doses of Actemra along with convalescent plasma.    Treatment so far  - Remdesivir started on 5/24 - Received Actemra x 2 on 5/24 and 5/25 - Received convalescent plasma on 5/25 - Continue IV Solu-Medrol was increased again on 10/25/2018  Hypoxia seems to be quite positional however he has shown little signs of improvement, with pruning and sitting up improves temporarily, currently on 15 L high  flow nasal cannula oxygen.  He has been between high flow 30 L to 4 L in the last few days, has been adequately diuresed, on IV steroids which will be continued dose was adjusted on 10/25/2018.  Continue sitting up in chair in the daytime with I-S and flutter valve for pulmonary toiletry and prone in bed  at night.    Unfortunately he continues to remain extremely tenuous and hypoxic.  Prognosis is poor he is DNR.  Daughter was updated by me personally on 10/25/2018.  Continue supportive care as he has already maximized his treatment, no further intubation.  Continue high flow nasal cannula oxygen as needed.  Monitor clinically.  I have updated family multiple times if he declines any further full goal of care will be comfort.  He is DNR.   COVID-19 Labs  Recent Labs    10/28/18 0152 10/29/18 0309 10/30/18 0300  DDIMER 0.72* 0.40  --   FERRITIN 304 307  --   LDH 352* 298* 395*  CRP <0.8 <0.8  --     Lab Results  Component Value Date   SARSCOV2NAA POSITIVE (A) 10/11/2018      Component Value Date/Time   BNP 111.8 (H) 10/30/2018 0845    Acute on chronic nonspecific CHF.  Could be mostly diastolic.  Does have bilateral rails on exam, no previous echo on file.  Diurese, low-dose beta-blocker and monitor.  Intermittent IV Lasix, outpatient cardiology follow-up and echocardiogram.    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  Hypokalemia - replaced and stable   Rectal pain while having bowel movement.  Anusol cream.  Outpatient GI follow-up.    Mild anxiety.  Low-dose Klonopin.    Newly Diagnosed DM2 - poor outpatient control due to hyperglycemia A1c of 10.7, Lantus ISS dose was adjusted on 10/25/2018.  Continue to monitor and adjust as needed, DM insulin education has been ordered.  Lab Results  Component Value Date   HGBA1C 10.7 (H) 10/11/2018   CBG (last 3)  Recent Labs    10/29/18 1049 10/29/18 1629 10/30/18 0749  GLUCAP 366* 118* 203*       Condition - Extremely Guarded  Family Communication  : Daughter [name] by me over the phone on 10/25/2018, son il law Jaymin on 10/28/18, 10/29/18  Code Status :  DNR  Diet : Regular  Disposition Plan  :  Home once better  Consults  :  None  Procedures  :  None  PUD Prophylaxis : None  DVT Prophylaxis  :  Lovenox   Lab Results  Component Value Date   PLT 296 10/30/2018    Inpatient Medications  Scheduled Meds: . chlorhexidine  15 mL Mouth Rinse BID  . clonazepam  0.5 mg Oral BID  . enoxaparin (LOVENOX) injection  40 mg Subcutaneous Q12H  . feeding supplement (GLUCERNA SHAKE)  237 mL Oral BID WC  . hydrocortisone   Rectal TID  . insulin aspart  0-15 Units Subcutaneous TID WC  . insulin aspart  0-5 Units Subcutaneous QHS  . insulin aspart  6 Units Subcutaneous TID WC  . insulin glargine  15 Units Subcutaneous Daily  . insulin starter kit- syringes  1 kit Other Once  . isosorbide mononitrate  60 mg Oral Daily  . living well with diabetes book   Does not apply Once  . mouth rinse  15 mL Mouth Rinse q12n4p  . methylPREDNISolone (SOLU-MEDROL) injection  60 mg Intravenous Daily  . metoprolol tartrate  50 mg Oral BID   Continuous Infusions:  PRN Meds:.acetaminophen, albuterol, alum & mag hydroxide-simeth, guaiFENesin-dextromethorphan, lip balm, [DISCONTINUED] ondansetron **OR** ondansetron (ZOFRAN) IV, senna-docusate, sodium chloride  Antibiotics  :    Anti-infectives (From admission, onward)   Start     Dose/Rate Route Frequency Ordered Stop   10/24/18 2000  ceFEPIme (MAXIPIME) 2  g in sodium chloride 0.9 % 100 mL IVPB  Status:  Discontinued     2 g 200 mL/hr over 30 Minutes Intravenous Every 8 hours 10/24/18 1943 10/25/18 1222   10/13/18 1400  remdesivir 100 mg in sodium chloride 0.9 % 230 mL IVPB     100 mg over 30 Minutes Intravenous Every 24 hours 10/12/18 1113 10/16/18 1930   10/12/18 1400  remdesivir 200 mg in sodium chloride 0.9 % 210 mL IVPB     200 mg over 30  Minutes Intravenous Once 10/12/18 1113 10/12/18 1749       Time Spent in minutes  30   Lala Lund M.D on 10/30/2018 at 10:42 AM  To page go to [email or web address] - password [facility]  [facility] -  Office  [phone]   See all Orders from today for further details    Objective:   Vitals:   10/30/18 0516 10/30/18 0750 10/30/18 1033 10/30/18 1034  BP: 135/82 128/85    Pulse: 85 (!) 107    Resp: 16 (!) 22 (!) 40 (!) 30  Temp: (!) 97.4 F (36.3 C) (!) 97.5 F (36.4 C)    TempSrc: Oral Axillary    SpO2: 98% 93% (!) 79% (!) 88%  Weight:      Height:        Wt Readings from Last 3 Encounters:  10/18/2018 72.6 kg     Intake/Output Summary (Last 24 hours) at 10/30/2018 1042 Last data filed at 10/30/2018 0630 Gross per 24 hour  Intake 720 ml  Output 500 ml  Net 220 ml     Physical Exam  Awake Alert, Oriented X 3, No new F.N deficits, Normal affect [place].AT,PERRAL Supple Neck,No JVD, No cervical lymphadenopathy appriciated.  Symmetrical Chest wall movement, Good air movement bilaterally, CTAB RRR,No Gallops, Rubs or new Murmurs, No Parasternal Heave +ve B.Sounds, Abd Soft, No tenderness, No organomegaly appriciated, No rebound - guarding or rigidity. No Cyanosis, Clubbing or edema, No new Rash or bruise    Data Review:    CBC Recent Labs  Lab 10/25/18 0728 10/26/18 0530 10/27/18 0305 10/28/18 0152 10/29/18 0309 10/30/18 0845  WBC 16.9* 19.6* 15.8* 18.4* 15.3* 14.6*  HGB 15.8 16.2 16.1 16.6 16.0 16.4  HCT 48.6 47.7 48.5 50.7 47.7 49.6  PLT 331 341 333 353 324 296  MCV 91.7 88.7 90.3 91.5 91.0 90.5  MCH 29.8 30.1 30.0 30.0 30.5 29.9  MCHC 32.5 34.0 33.2 32.7 33.5 33.1  RDW 13.6 13.5 13.7 13.8 13.8 13.8  LYMPHSABS 1.6 1.8 1.6 2.4 1.1  --   MONOABS 0.7 0.7 0.7 0.7 0.7  --   EOSABS 0.0 0.0 0.0 0.0 0.0  --   BASOSABS 0.0 0.0 0.0 0.0 0.0  --     Chemistries  Recent Labs  Lab 10/26/18 0530 10/27/18 0305 10/28/18 0152 10/29/18 0309 10/30/18 0845  NA  133* 135 133* 132* 135  K 4.0 4.1 3.8 4.0 3.8  CL 94* 93* 90* 92* 92*  CO2 28 32 30 26 32  GLUCOSE 104* 114* 93 238* 137*  BUN 42* 42* 45* 43* 44*  CREATININE 0.71 0.79 0.69 0.68 0.72  CALCIUM 9.1 9.1 8.9 9.3 8.8*  MG 2.5* 2.5* 2.7* 2.3 2.5*  AST 35 _0 32  ALT 43 41 41 35 44  ALKPHOS 75 70 71 82 70  BILITOT 1.0 1.0 0.8 1.2 0.6   ------------------------------------------------------------------------------------------------------------------ No results for input(s): CHOL, HDL, LDLCALC, TRIG, CHOLHDL, LDLDIRECT in the last 72 hours.  Lab Results  Component Value Date   HGBA1C 10.7 (H) 10/11/2018   ------------------------------------------------------------------------------------------------------------------ No results for input(s): TSH, T4TOTAL, T3FREE, THYROIDAB in the last 72 hours.  Invalid input(s): FREET3  Cardiac Enzymes No results for input(s): CKMB, TROPONINI, MYOGLOBIN in the last 168 hours.  Invalid input(s): CK ------------------------------------------------------------------------------------------------------------------    Component Value Date/Time   BNP 111.8 (H) 10/30/2018 0845    Micro Results No results found for this or any previous visit (from the past 240 hour(s)).  Radiology Reports Ct Chest Wo Contrast  Result Date: 10/26/2018 CLINICAL DATA:  Acute respiratory illness, COVID-19 positive. EXAM: CT CHEST WITHOUT CONTRAST TECHNIQUE: Multidetector CT imaging of the chest was performed following the standard protocol without IV contrast. COMPARISON:  10/11/2018 FINDINGS: Cardiovascular: Heart is normal size. Calcified plaque over the left main, left anterior descending and lateral circumflex coronary arteries. Calcified plaque over the thoracic aorta. Remaining vascular structures are unremarkable. Mediastinum/Nodes: No significant mediastinal or hilar adenopathy. Remaining mediastinal structures are unremarkable. Lungs/Pleura: Lungs are adequately  inflated demonstrate moderate interval worsening of a patchy bilateral airspace process demonstrating ground-glass and crazy paving features and is mid to lower lung predominant. No effusion. Airways unremarkable. Upper Abdomen: No acute findings. Calcified plaque over the abdominal aorta. Musculoskeletal: Mild degenerative change of the spine. IMPRESSION: Interval worsening of bilateral multifocal airspace process worse over the mid to lower lungs compatible with worsening known viral pneumonia. Aortic Atherosclerosis (ICD10-I70.0). Atherosclerotic coronary artery disease. Electronically Signed   By: Marin Olp M.D.   On: 10/26/2018 15:43   Ct Angio Chest Pe W And/or Wo Contrast  Result Date: 10/11/2018 CLINICAL DATA:  68 y/o  M; cough, fever, chills.  COVID-19 positive. EXAM: CT ANGIOGRAPHY CHEST WITH CONTRAST TECHNIQUE: Multidetector CT imaging of the chest was performed using the standard protocol during bolus administration of intravenous contrast. Multiplanar CT image reconstructions and MIPs were obtained to evaluate the vascular anatomy. CONTRAST:  116m OMNIPAQUE IOHEXOL 300 MG/ML  SOLN COMPARISON:  10/02/2018 chest radiograph FINDINGS: Cardiovascular: Mild respiratory motion artifact. Satisfactory opacification of the pulmonary arteries to the segmental level. No evidence of pulmonary embolism. Normal heart size. No pericardial effusion. Mild aortic and moderate coronary artery calcific atherosclerosis. Mediastinum/Nodes: No enlarged mediastinal, hilar, or axillary lymph nodes. Thyroid gland, trachea, and esophagus demonstrate no significant findings. Lungs/Pleura: Multiple peripheral ground-glass opacities with the largest confluent opacity in the right upper lobe. The right upper lobe demonstrates intra lobular septal thickening "crazy paving" pattern. No pleural effusion or pneumothorax. Upper Abdomen: No acute abnormality. Musculoskeletal: No chest wall abnormality. No acute or significant  osseous findings. Review of the MIP images confirms the above findings. IMPRESSION: 1. Mild respiratory motion artifact. No pulmonary embolus identified. 2. There are a spectrum of findings in the lungs which can be seen with acute atypical infection (as well as other non-infectious etiologies). In particular, viral pneumonia (including COVID-19) should be considered in the appropriate clinical setting. Critical Value/emergent results were called by telephone at the time of interpretation on 10/11/2018 at 3:08 am to PA MQuamba, who verbally acknowledged these results. Electronically Signed   By: LKristine GarbeM.D.   On: 10/11/2018 03:21   Dg Chest Port 1 View  Result Date: 10/30/2018 CLINICAL DATA:  COPD, positive COVID-19. EXAM: PORTABLE CHEST 1 VIEW COMPARISON:  10/24/2018 and 09/28/2018 as well as chest CT 10/26/2018 FINDINGS: Lungs are somewhat hypoinflated and demonstrate bilateral mixed interstitial airspace opacification which is mid to lower lung predominant and not significantly changed from the  recent prior exam 10/24/2018. No effusion. Cardiomediastinal silhouette and remainder of the exam is unchanged. IMPRESSION: Stable bilateral mixed interstitial airspace process likely due to viral pneumonia in this patient who is known COVID-19 positive. Electronically Signed   By: Marin Olp M.D.   On: 10/30/2018 08:41   Dg Chest Port 1 View  Result Date: 10/24/2018 CLINICAL DATA:  Shortness of breath.  COVID-19. EXAM: PORTABLE CHEST 1 VIEW COMPARISON:  Chest x-rays dated 10/16/2018, 10/12/2018 and 10/07/2018 FINDINGS: The bilateral pulmonary infiltrates have progressed. Heart size and vascularity are normal. No effusions. No significant bone abnormality. Aortic atherosclerosis. IMPRESSION: Progression of the bilateral hazy pulmonary infiltrates since the prior study of 05/20 Aortic Atherosclerosis (ICD10-I70.0). Electronically Signed   By: Lorriane Shire M.D.   On: 10/24/2018 08:46   Dg  Chest Port 1 View  Result Date: 10/19/2018 CLINICAL DATA:  Shortness of breath EXAM: PORTABLE CHEST 1 VIEW COMPARISON:  10/16/2018 FINDINGS: Multifocal airspace opacities, improved in the upper lungs bilaterally, persistent at the lung bases. This appearance favors improving multifocal pneumonia. No pleural effusion or pneumothorax. The heart is normal in size. IMPRESSION: Improving multifocal pneumonia, as above. Electronically Signed   By: Julian Hy M.D.   On: 10/19/2018 10:21   Dg Chest Port 1 View  Result Date: 10/16/2018 CLINICAL DATA:  Dyspnea.COVID19 EXAM: PORTABLE CHEST 1 VIEW COMPARISON:  10/12/2018 FINDINGS: Heart size appears normal. No pleural effusion identified. Diffuse hazy opacities are noted in the left lung. Airspace consolidation within the right upper lobe and left base is improved from previous exam. IMPRESSION: 1. Persistent left lung hazy opacities compatible with inflammation/infection. 2. Improving consolidation within the right upper lobe and left base. Electronically Signed   By: Kerby Moors M.D.   On: 10/16/2018 09:15   Dg Chest Port 1 View  Result Date: 10/12/2018 CLINICAL DATA:  Weakness and fever, history of COVID-19 EXAM: PORTABLE CHEST 1 VIEW COMPARISON:  10/11/2018 FINDINGS: Cardiac shadow is stable. Patchy infiltrates are seen bilaterally particularly in the right upper lobe consistent with that seen on recent CT examination and consistent with the patient's given clinical history. No sizable effusion is seen. No bony abnormality is noted. Elevation the right hemidiaphragm is again noted. IMPRESSION: Patchy infiltrates predominately within the right upper lobe consistent with the given clinical history of COVID-19 positivity. Electronically Signed   By: Inez Catalina M.D.   On: 10/12/2018 03:59   Dg Chest Portable 1 View  Result Date: 10/15/2018 CLINICAL DATA:  Symptoms of possible COVID-19 EXAM: PORTABLE CHEST 1 VIEW COMPARISON:  None. FINDINGS: The heart  size and mediastinal contours are within normal limits. Both lungs are clear. The visualized skeletal structures are unremarkable. IMPRESSION: No active disease. Electronically Signed   By: Ulyses Jarred M.D.   On: 10/06/2018 22:55

## 2018-10-30 NOTE — TOC Initial Note (Signed)
Transition of Care [facility]) - Initial/Assessment Note   Patient Details  Name: Austin Page MRN: [id number] Date of Birth: [birth year]  [facility] CM/SW Contact:    Midge Minium RN, BSN, NCM-BC, ACM-RN [phone] Georgetta Haber remotely) Phone Number: 10/30/2018, 2:28 PM  Clinical Narrative:                 CM consult acknowledged for [facility]. CM spoke to [place], [place] liaison; the patient does not meet LTACH criteria for admission. Patient continues on HFNC, but is uninsured, PT recommends HHPT/RW, no trach, nor available beds at the facility, per the liaison. Patient does not have an established PCP with a hospital f/u appointment arranged on 11/03/18 @ 0930; AVS updated. OT eval pending. CM team will continue to follow for progression needs.   Expected Discharge Plan: [place] Barriers to Discharge: Continued Medical Work up(High oxygen requirement)   Patient Goals and CMS Choice     Choice offered to / list presented to : NA  Expected Discharge Plan and Services Expected Discharge Plan: [place] In-house Referral: NA Discharge Planning Services: CM Consult, Follow-up appt scheduled, Medication Assistance Post Acute Care Choice: NA Living arrangements for the past 2 months: Single Family Home                  Prior Living Arrangements/Services Living arrangements for the past 2 months: Single Family Home Lives with:: Self, Adult Children  Activities of Daily Living Home Assistive Devices/Equipment: None ADL Screening (condition at time of admission) Patient's cognitive ability adequate to safely complete daily activities?: Yes Is the patient deaf or have difficulty hearing?: No Does the patient have difficulty seeing, even when wearing glasses/contacts?: Yes Does the patient have difficulty concentrating, remembering, or making decisions?: No Patient able to express need for assistance with ADLs?: Yes Does the patient have  difficulty dressing or bathing?: No Independently performs ADLs?: Yes (appropriate for developmental age) Does the patient have difficulty walking or climbing stairs?: No Weakness of Legs: None Weakness of Arms/Hands: None   Admission diagnosis:  COVID-19 [U07.1, J98.8] Patient Active Problem List   Diagnosis Date Noted  . Acute respiratory distress syndrome (ARDS) due to COVID-19 virus   . COVID-19   . Dyspnea   . Acute respiratory disease due to COVID-19 virus 10/11/2018  . Hyperglycemia 10/11/2018  . Hyponatremia 10/11/2018   PCP:  Patient, [name] Pharmacy:  No Pharmacies Listed    Social Determinants of Health (SDOH) Interventions    Readmission Risk Interventions No flowsheet data found.

## 2018-10-30 NOTE — Evaluation (Signed)
Physical Therapy Evaluation Patient Details Name: Austin Page MRN: [id number] DOB: 1950/12/18 Today's Date: 10/30/2018   History of Present Illness  68 y.o. male admitted on 04/15/19 with fever, cough, N/V and generalized weakness.  He was found to be COVID (+) with acute hypoxic respiratory failure, acute on chronic nonspecific CHF, hyponatremia, hypokalemia, anxiety, DM2 (newly dx).  Pt with no significant PMH on file.    Clinical Impression  Pt has significant activity intolerance.  On NRB 15L he was able to transfer OOB to the recliner chair with one person mod assist over buckling knees.  RR up to the 40s, O2 in the upper 70s and ~5 mins to recover seated back to 88% where I switched him back to 15 L HFNC.  Pt gasping for air during the recovery period.  He reports via [employer] interpreter being independent and working in housekeeping PTA.  He lives with his wife who is currently well.   PT to follow acutely for deficits listed below.      Follow Up Recommendations Home health PT    Equipment Recommendations  Rolling walker with 5" wheels    Recommendations for Other Services OT consult     Precautions / Restrictions Precautions Precautions: Fall;Other (comment) Precaution Comments: monitor O2 sats, very tenuous. Restrictions Weight Bearing Restrictions: No      Mobility  Bed Mobility Overal bed mobility: Needs Assistance Bed Mobility: Supine to Sit     Supine to sit: Min assist     General bed mobility comments: Min hand held assist to pull up to sitting EOB.    Transfers Overall transfer level: Needs assistance Equipment used: 1 person hand held assist Transfers: Sit to/from [employer] CorporationStand;Stand Pivot Transfers Sit to Stand: Mod assist Stand pivot transfers: Mod assist       General transfer comment: Mod assist over buckling knees to stand and pivot to the recliner chair.  He almost missed the seat.  Next time would be helpful to use the RW.   Ambulation/Gait              General Gait Details: NT due to low pulmonary tolerance of mobility.          Balance Overall balance assessment: Needs assistance Sitting-balance support: Feet supported;Bilateral upper extremity supported Sitting balance-Leahy Scale: Fair     Standing balance support: Bilateral upper extremity supported Standing balance-Leahy Scale: Poor Standing balance comment: needs external assit                             Pertinent Vitals/Pain Pain Assessment: No/denies pain    Home Living Family/patient expects to be discharged to:: Private residence Living Arrangements: Spouse/significant other Available Help at Discharge: Family;Available 24 hours/day(wife works, but he says she can be home) Type of Home: House Home Access: Level entry     Home Layout: One level Home Equipment: Cane - single point("somewhere")      Prior Function Level of Independence: Independent         Comments: does not drive, but 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         Extremity/Trunk Assessment   Upper Extremity Assessment Upper Extremity Assessment: Defer to OT evaluation    Lower Extremity Assessment Lower Extremity Assessment: Generalized weakness(3-/5 grossly)    Cervical / Trunk Assessment Cervical / Trunk Assessment: Normal  Communication   Communication: Prefers language other than English(Hindi)  Cognition Arousal/Alertness: Awake/alert Behavior During Therapy: Anxious  Overall Cognitive Status: Within Functional Limits for tasks assessed                                 General Comments: Generally intact      General Comments General comments (skin integrity, edema, etc.): RN recommended changing from HFNC to NRB mask for mobility.  On NRB 15L, pt desaturated to 79% with RR in the 40s.  ~5 mins recovery with mask on to get to 88%, then switched back to HFNC at 15L (pt likes this better (more comfortable).          Assessment/Plan     PT Assessment Patient needs continued PT services  PT Problem List Decreased strength;Decreased activity tolerance;Decreased balance;Decreased mobility;Decreased knowledge of use of DME;Decreased knowledge of precautions;Cardiopulmonary status limiting activity       PT Treatment Interventions DME instruction;Gait training;Functional mobility training;Therapeutic activities;Therapeutic exercise;Balance training;Patient/family education    PT Goals (Current goals can be found in the Care Plan section)  Acute Rehab PT Goals Patient Stated Goal: to get better, go home PT Goal Formulation: With patient Time For Goal Achievement: 11/13/18 Potential to Achieve Goals: Good    Frequency Min 3X/week           AM-PAC PT "6 Clicks" Mobility  Outcome Measure Help needed turning from your back to your side while in a flat bed without using bedrails?: A Little Help needed moving from lying on your back to sitting on the side of a flat bed without using bedrails?: A Little Help needed moving to and from a bed to a chair (including a wheelchair)?: A Lot Help needed standing up from a chair using your arms (e.g., wheelchair or bedside chair)?: A Lot Help needed to walk in hospital room?: A Lot Help needed climbing 3-5 steps with a railing? : Total 6 Click Score: 13    End of Session Equipment Utilized During Treatment: Oxygen Activity Tolerance: Patient limited by fatigue;Treatment limited secondary to medical complications (Comment) Patient left: in chair;with call bell/phone within reach   PT Visit Diagnosis: Muscle weakness (generalized) (M62.81);Difficulty in walking, not elsewhere classified (R26.2)    Time: 4098-1191 PT Time Calculation (min) (ACUTE ONLY): 30 min   Charges:      Wells Guiles B. Ko Bardon, PT, DPT  Acute Rehabilitation [phone] pager #[phone] office   PT Evaluation $PT Eval Moderate Complexity: 1 Mod PT Treatments $Therapeutic Activity: 8-22  mins        10/30/2018, 10:42 AM

## 2018-10-31 LAB — COMPREHENSIVE METABOLIC PANEL
ALT: 39 U/L (ref 0–44)
AST: 22 U/L (ref 15–41)
Albumin: 2.9 g/dL — ABNORMAL LOW (ref 3.5–5.0)
Alkaline Phosphatase: 62 U/L (ref 38–126)
Anion gap: 11 (ref 5–15)
BUN: 49 mg/dL — ABNORMAL HIGH (ref 8–23)
CO2: 30 mmol/L (ref 22–32)
Calcium: 9.1 mg/dL (ref 8.9–10.3)
Chloride: 96 mmol/L — ABNORMAL LOW (ref 98–111)
Creatinine, Ser: 0.65 mg/dL (ref 0.61–1.24)
GFR calc Af Amer: >60 mL/min (ref >60)
GFR calc non Af Amer: >60 mL/min (ref >60)
Glucose, Bld: 103 mg/dL — ABNORMAL HIGH (ref 70–99)
Potassium: 4.8 mmol/L (ref 3.5–5.1)
Sodium: 137 mmol/L (ref 135–145)
Total Bilirubin: 0.7 mg/dL (ref 0.3–1.2)
Total Protein: 5.5 g/dL — ABNORMAL LOW (ref 6.5–8.1)

## 2018-10-31 LAB — CBC WITH DIFFERENTIAL/PLATELET
Abs Immature Granulocytes: 0.09 10*3/uL — ABNORMAL HIGH (ref 0.00–0.07)
Basophils Absolute: 0 10*3/uL (ref 0.0–0.1)
Basophils Relative: 0 %
Eosinophils Absolute: 0 10*3/uL (ref 0.0–0.5)
Eosinophils Relative: 0 %
HCT: 46.4 % (ref 39.0–52.0)
Hemoglobin: 15.2 g/dL (ref 13.0–17.0)
Immature Granulocytes: 1 %
Lymphocytes Relative: 8 %
Lymphs Abs: 1.2 10*3/uL (ref 0.7–4.0)
MCH: 29.8 pg (ref 26.0–34.0)
MCHC: 32.8 g/dL (ref 30.0–36.0)
MCV: 91 fL (ref 80.0–100.0)
Monocytes Absolute: 0.6 10*3/uL (ref 0.1–1.0)
Monocytes Relative: 4 %
Neutro Abs: 13 10*3/uL — ABNORMAL HIGH (ref 1.7–7.7)
Neutrophils Relative %: 87 %
Platelets: 278 10*3/uL (ref 150–400)
RBC: 5.1 MIL/uL (ref 4.22–5.81)
RDW: 13.8 % (ref 11.5–15.5)
WBC: 14.9 10*3/uL — ABNORMAL HIGH (ref 4.0–10.5)
nRBC: 0 % (ref 0.0–0.2)

## 2018-10-31 LAB — GLUCOSE, CAPILLARY
Glucose-Capillary: 107 mg/dL — ABNORMAL HIGH (ref 70–99)
Glucose-Capillary: 294 mg/dL — ABNORMAL HIGH (ref 70–99)
Glucose-Capillary: 357 mg/dL — ABNORMAL HIGH (ref 70–99)
Glucose-Capillary: 210 mg/dL — ABNORMAL HIGH (ref 70–99)

## 2018-10-31 LAB — FERRITIN: Ferritin: 206 ng/mL (ref 24–336)

## 2018-10-31 LAB — MAGNESIUM: Magnesium: 2.6 mg/dL — ABNORMAL HIGH (ref 1.7–2.4)

## 2018-10-31 LAB — D-DIMER, QUANTITATIVE: D-Dimer, Quant: 0.36 ug/mL-FEU (ref 0.00–0.50)

## 2018-10-31 LAB — BRAIN NATRIURETIC PEPTIDE: B Natriuretic Peptide: 73.1 pg/mL (ref 0.0–100.0)

## 2018-10-31 LAB — LACTATE DEHYDROGENASE: LDH: 282 U/L — ABNORMAL HIGH (ref 98–192)

## 2018-10-31 LAB — C-REACTIVE PROTEIN: CRP: <0.8 mg/dL (ref <1.0)

## 2018-10-31 MED ORDER — FUROSEMIDE 10 MG/ML IJ SOLN
60.0000 mg | Freq: Once | INTRAMUSCULAR | Status: AC
  Administered 2018-10-31: 60 mg via INTRAVENOUS
  Filled 2018-10-31: qty 6

## 2018-10-31 NOTE — Progress Notes (Signed)
Rt called to bedside due to desat when patient was using the bedside commode.  RT asked RN to return patient to bed and try to prone until RT could arrive.  Upon entering the room RT found RN at bedside, patient in bed with HFNC at 15 L, sats slowly climbing to 88%. Patient is lying on his left side and apprears comfortable at this time.

## 2018-10-31 NOTE — Progress Notes (Signed)
OT Cancellation Note  Patient Details Name: Austin Page MRN: [id number] DOB: 1950-08-27   Cancelled Treatment:    Reason Eval/Treat Not Completed: Patient not medically ready;Medical issues which prohibited therapy. Nsg asked to hold today due to respiratory distress this am. Will follow un Sunday or Monday as appropriate.   Ramond Dial, OT/L   Acute OT Clinical Specialist Acute Rehabilitation Services Pager [phone] Office [phone]  10/31/2018, 11:06 AM

## 2018-10-31 NOTE — Progress Notes (Signed)
[facility] Department and AHF Team  ReDS Research Project   Patient #: [id number]  ReDS Measurement  Right: 32%  Left: 33%

## 2018-10-31 NOTE — Progress Notes (Addendum)
PROGRESS NOTE                                                                                                                                                                                                             Patient Demographics:    Austin Page, is a 68 y.o. male, DOB - 1951-03-23, QHK:[id number]  Outpatient Primary MD for the patient is Patient, [name]    LOS - 20  Admit date - 09/29/2018    Chief Complaint  Patient presents with  . Cough       Brief Narrative  68 year old male with no significant past medical history came into the hospital and was admitted on 10/16/2018 with 3 days of fever, chills, cough, vomiting and weakness in the setting of having a normal Covid contact.  In the ER he was febrile, tachycardic, tachypneic, underwent a CT angiogram which showed peripheral groundglass opacities.  He was Covid positive himself.  He had rapid deterioration of respiratory status requiring 15 L high flow nasal cannula and was transferred to the ICU on 5/25, he was transferred out of ICU on 10/19/2018 and transferred to my care on the same date.   Subjective:   Patient in bed, appears comfortable, denies any headache, no fever, no chest pain or pressure, +ve shortness of breath , no abdominal pain. No focal weakness.   Assessment  & Plan :     1. Acute Hypoxic Resp. Failure due to Acute Covid 19 Viral Illness during the ongoing 2020 Covid 19 Pandemic - has received appropriate treatment which include IV steroids, 2 doses of Actemra along with convalescent plasma.    Treatment so far  - Remdesivir started on 5/24 - Received Actemra x 2 on 5/24 and 5/25 - Received convalescent plasma on 5/25 - Continue IV Solu-Medrol was increased again on 10/25/2018  Hypoxia seems to be quite positional however he has shown little signs of improvement, with pruning and sitting up improves temporarily, currently on 15 L high  flow nasal cannula oxygen.  He has been between high flow 30 L to 4 L in the last few days, has been adequately diuresed, on IV steroids which will be continued dose was adjusted on 10/25/2018.  Continue sitting up in chair in the daytime with I-S and flutter valve for pulmonary toiletry and prone in bed  at night.    Unfortunately he continues to remain extremely tenuous and hypoxic.  Prognosis is poor he is DNR.  Daughter was updated by me personally on 10/25/2018.  Continue supportive care as he has already maximized his treatment, no further intubation.  Continue high flow nasal cannula oxygen as needed.  Monitor clinically.  I have updated family multiple times if he declines any further full goal of care will be comfort.  He is DNR.   COVID-19 Labs  Recent Labs    10/29/18 0309 10/30/18 0300 10/31/18 0300  DDIMER 0.40  --  0.36  FERRITIN 307  --  206  LDH 298* 395* 282*  CRP <0.8  --  <0.8    Lab Results  Component Value Date   SARSCOV2NAA POSITIVE (A) 09/30/2018      Component Value Date/Time   BNP 73.1 10/31/2018 0300    Acute on chronic nonspecific CHF.  Could be mostly diastolic.  Does have bilateral rails on exam, no previous echo on file.  Diurese, low-dose beta-blocker and monitor.  Intermittent IV Lasix repeat 10/31/18, outpatient cardiology follow-up and echocardiogram.    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  Hypokalemia - replaced and stable   Rectal pain while having bowel movement.  Anusol cream.  Outpatient GI follow-up.    Mild anxiety.  Low-dose Klonopin.    Newly Diagnosed DM2 - poor outpatient control due to hyperglycemia A1c of 10.7, Lantus ISS dose was adjusted on 10/25/2018.  Continue to monitor and adjust as needed, DM insulin education has been ordered.  Lab Results  Component Value Date   HGBA1C 10.7 (H) 10/11/2018   CBG (last 3)  Recent Labs    10/30/18 1644 10/30/18 2102 10/31/18 0748  GLUCAP 235* 141* 107*       Condition - Extremely Guarded  Family Communication  : Daughter [name] by me over the phone on 10/25/2018, son il law Jaymin on 10/28/18, 10/29/18  Code Status :  DNR  Diet : Regular  Disposition Plan  :  Home once better  Consults  :  None  Procedures  :  None  PUD Prophylaxis : None  DVT Prophylaxis  :  Lovenox   Lab Results  Component Value Date   PLT 278 10/31/2018    Inpatient Medications  Scheduled Meds: . chlorhexidine  15 mL Mouth Rinse BID  . clonazepam  0.5 mg Oral BID  . enoxaparin (LOVENOX) injection  40 mg Subcutaneous Q12H  . feeding supplement (GLUCERNA SHAKE)  237 mL Oral BID WC  . hydrocortisone   Rectal TID  . insulin aspart  0-15 Units Subcutaneous TID WC  . insulin aspart  0-5 Units Subcutaneous QHS  . insulin aspart  6 Units Subcutaneous TID WC  . insulin glargine  15 Units Subcutaneous Daily  . insulin starter kit- syringes  1 kit Other Once  . isosorbide mononitrate  60 mg Oral Daily  . living well with diabetes book   Does not apply Once  . mouth rinse  15 mL Mouth Rinse q12n4p  . methylPREDNISolone (SOLU-MEDROL) injection  60 mg Intravenous Daily  . metoprolol tartrate  50 mg Oral BID   Continuous Infusions:  PRN Meds:.acetaminophen, albuterol, alum & mag hydroxide-simeth, guaiFENesin-dextromethorphan, lip balm, [DISCONTINUED] ondansetron **OR** ondansetron (ZOFRAN) IV, senna-docusate, sodium chloride  Antibiotics  :    Anti-infectives (From admission, onward)   Start     Dose/Rate Route Frequency Ordered Stop   10/24/18 2000  ceFEPIme (MAXIPIME)  2 g in sodium chloride 0.9 % 100 mL IVPB  Status:  Discontinued     2 g 200 mL/hr over 30 Minutes Intravenous Every 8 hours 10/24/18 1943 10/25/18 1222   10/13/18 1400  remdesivir 100 mg in sodium chloride 0.9 % 230 mL IVPB     100 mg over 30 Minutes Intravenous Every 24 hours 10/12/18 1113 10/16/18 1930   10/12/18 1400  remdesivir 200 mg in sodium chloride 0.9 % 210 mL IVPB     200 mg over  30 Minutes Intravenous Once 10/12/18 1113 10/12/18 1749       Time Spent in minutes  30   Lala Lund M.D on 10/31/2018 at 9:03 AM  To page go to [email or web address] - password [facility]  [facility] -  Office  [phone]   See all Orders from today for further details    Objective:   Vitals:   10/30/18 1647 10/30/18 2104 10/31/18 0357 10/31/18 0750  BP: 106/73 111/84 121/82 118/80  Pulse: 100 91 85 88  Resp:    18  Temp: 98.3 F (36.8 C) (!) 97.2 F (36.2 C) 97.7 F (36.5 C) 97.6 F (36.4 C)  TempSrc: Axillary Oral Oral Oral  SpO2: 92% 96% 94% (!) 84%  Weight:      Height:        Wt Readings from Last 3 Encounters:  10/15/2018 72.6 kg     Intake/Output Summary (Last 24 hours) at 10/31/2018 0903 Last data filed at 10/31/2018 0752 Gross per 24 hour  Intake 840 ml  Output 525 ml  Net 315 ml     Physical Exam  Awake Alert, Oriented X 3, No new F.N deficits, Normal affect [place].AT,PERRAL Supple Neck,No JVD, No cervical lymphadenopathy appriciated.  Symmetrical Chest wall movement, Good air movement bilaterally, +ve rales RRR,No Gallops, Rubs or new Murmurs, No Parasternal Heave +ve B.Sounds, Abd Soft, No tenderness, No organomegaly appriciated, No rebound - guarding or rigidity. No Cyanosis, Clubbing or edema, No new Rash or bruise     Data Review:    CBC Recent Labs  Lab 10/26/18 0530 10/27/18 0305 10/28/18 0152 10/29/18 0309 10/30/18 0845 10/31/18 0300  WBC 19.6* 15.8* 18.4* 15.3* 14.6* 14.9*  HGB 16.2 16.1 16.6 16.0 16.4 15.2  HCT 47.7 48.5 50.7 47.7 49.6 46.4  PLT 341 333 353 324 296 278  MCV 88.7 90.3 91.5 91.0 90.5 91.0  MCH 30.1 30.0 30.0 30.5 29.9 29.8  MCHC 34.0 33.2 32.7 33.5 33.1 32.8  RDW 13.5 13.7 13.8 13.8 13.8 13.8  LYMPHSABS 1.8 1.6 2.4 1.1  --  1.2  MONOABS 0.7 0.7 0.7 0.7  --  0.6  EOSABS 0.0 0.0 0.0 0.0  --  0.0  BASOSABS 0.0 0.0 0.0 0.0  --  0.0    Chemistries  Recent Labs  Lab 10/27/18 0305 10/28/18 0152 10/29/18  0309 10/30/18 0845 10/31/18 0300  NA 135 133* 132* 135 137  K 4.1 3.8 4.0 3.8 4.8  CL 93* 90* 92* 92* 96*  CO2 32 30 26 32 30  GLUCOSE 114* 93 238* 137* 103*  BUN 42* 45* 43* 44* 49*  CREATININE 0.79 0.69 0.68 0.72 0.65  CALCIUM 9.1 8.9 9.3 8.8* 9.1  MG 2.5* 2.7* 2.3 2.5* 2.6*  AST 27 25 22  32 22  ALT 41 41 35 44 39  ALKPHOS 70 71 82 70 62  BILITOT 1.0 0.8 1.2 0.6 0.7   ------------------------------------------------------------------------------------------------------------------ No results for input(s): CHOL, HDL, LDLCALC, TRIG, CHOLHDL, LDLDIRECT in the  last 72 hours.  Lab Results  Component Value Date   HGBA1C 10.7 (H) 10/11/2018   ------------------------------------------------------------------------------------------------------------------ No results for input(s): TSH, T4TOTAL, T3FREE, THYROIDAB in the last 72 hours.  Invalid input(s): FREET3  Cardiac Enzymes No results for input(s): CKMB, TROPONINI, MYOGLOBIN in the last 168 hours.  Invalid input(s): CK ------------------------------------------------------------------------------------------------------------------    Component Value Date/Time   BNP 73.1 10/31/2018 0300    Micro Results No results found for this or any previous visit (from the past 240 hour(s)).  Radiology Reports Ct Chest Wo Contrast  Result Date: 10/26/2018 CLINICAL DATA:  Acute respiratory illness, COVID-19 positive. EXAM: CT CHEST WITHOUT CONTRAST TECHNIQUE: Multidetector CT imaging of the chest was performed following the standard protocol without IV contrast. COMPARISON:  10/11/2018 FINDINGS: Cardiovascular: Heart is normal size. Calcified plaque over the left main, left anterior descending and lateral circumflex coronary arteries. Calcified plaque over the thoracic aorta. Remaining vascular structures are unremarkable. Mediastinum/Nodes: No significant mediastinal or hilar adenopathy. Remaining mediastinal structures are unremarkable.  Lungs/Pleura: Lungs are adequately inflated demonstrate moderate interval worsening of a patchy bilateral airspace process demonstrating ground-glass and crazy paving features and is mid to lower lung predominant. No effusion. Airways unremarkable. Upper Abdomen: No acute findings. Calcified plaque over the abdominal aorta. Musculoskeletal: Mild degenerative change of the spine. IMPRESSION: Interval worsening of bilateral multifocal airspace process worse over the mid to lower lungs compatible with worsening known viral pneumonia. Aortic Atherosclerosis (ICD10-I70.0). Atherosclerotic coronary artery disease. Electronically Signed   By: Marin Olp M.D.   On: 10/26/2018 15:43   Ct Angio Chest Pe W And/or Wo Contrast  Result Date: 10/11/2018 CLINICAL DATA:  68 y/o  M; cough, fever, chills.  COVID-19 positive. EXAM: CT ANGIOGRAPHY CHEST WITH CONTRAST TECHNIQUE: Multidetector CT imaging of the chest was performed using the standard protocol during bolus administration of intravenous contrast. Multiplanar CT image reconstructions and MIPs were obtained to evaluate the vascular anatomy. CONTRAST:  153m OMNIPAQUE IOHEXOL 300 MG/ML  SOLN COMPARISON:  10/09/2018 chest radiograph FINDINGS: Cardiovascular: Mild respiratory motion artifact. Satisfactory opacification of the pulmonary arteries to the segmental level. No evidence of pulmonary embolism. Normal heart size. No pericardial effusion. Mild aortic and moderate coronary artery calcific atherosclerosis. Mediastinum/Nodes: No enlarged mediastinal, hilar, or axillary lymph nodes. Thyroid gland, trachea, and esophagus demonstrate no significant findings. Lungs/Pleura: Multiple peripheral ground-glass opacities with the largest confluent opacity in the right upper lobe. The right upper lobe demonstrates intra lobular septal thickening "crazy paving" pattern. No pleural effusion or pneumothorax. Upper Abdomen: No acute abnormality. Musculoskeletal: No chest wall  abnormality. No acute or significant osseous findings. Review of the MIP images confirms the above findings. IMPRESSION: 1. Mild respiratory motion artifact. No pulmonary embolus identified. 2. There are a spectrum of findings in the lungs which can be seen with acute atypical infection (as well as other non-infectious etiologies). In particular, viral pneumonia (including COVID-19) should be considered in the appropriate clinical setting. Critical Value/emergent results were called by telephone at the time of interpretation on 10/11/2018 at 3:08 am to PA MElmo, who verbally acknowledged these results. Electronically Signed   By: LKristine GarbeM.D.   On: 10/11/2018 03:21   Dg Chest Port 1 View  Result Date: 10/30/2018 CLINICAL DATA:  COPD, positive COVID-19. EXAM: PORTABLE CHEST 1 VIEW COMPARISON:  10/24/2018 and 10/19/2018 as well as chest CT 10/26/2018 FINDINGS: Lungs are somewhat hypoinflated and demonstrate bilateral mixed interstitial airspace opacification which is mid to lower lung predominant and not significantly  changed from the recent prior exam 10/24/2018. No effusion. Cardiomediastinal silhouette and remainder of the exam is unchanged. IMPRESSION: Stable bilateral mixed interstitial airspace process likely due to viral pneumonia in this patient who is known COVID-19 positive. Electronically Signed   By: Marin Olp M.D.   On: 10/30/2018 08:41   Dg Chest Port 1 View  Result Date: 10/24/2018 CLINICAL DATA:  Shortness of breath.  COVID-19. EXAM: PORTABLE CHEST 1 VIEW COMPARISON:  Chest x-rays dated 10/16/2018, 10/12/2018 and 09/19/2018 FINDINGS: The bilateral pulmonary infiltrates have progressed. Heart size and vascularity are normal. No effusions. No significant bone abnormality. Aortic atherosclerosis. IMPRESSION: Progression of the bilateral hazy pulmonary infiltrates since the prior study of 05/20 Aortic Atherosclerosis (ICD10-I70.0). Electronically Signed   By: Lorriane Shire M.D.   On: 10/24/2018 08:46   Dg Chest Port 1 View  Result Date: 10/19/2018 CLINICAL DATA:  Shortness of breath EXAM: PORTABLE CHEST 1 VIEW COMPARISON:  10/16/2018 FINDINGS: Multifocal airspace opacities, improved in the upper lungs bilaterally, persistent at the lung bases. This appearance favors improving multifocal pneumonia. No pleural effusion or pneumothorax. The heart is normal in size. IMPRESSION: Improving multifocal pneumonia, as above. Electronically Signed   By: Julian Hy M.D.   On: 10/19/2018 10:21   Dg Chest Port 1 View  Result Date: 10/16/2018 CLINICAL DATA:  Dyspnea.COVID19 EXAM: PORTABLE CHEST 1 VIEW COMPARISON:  10/12/2018 FINDINGS: Heart size appears normal. No pleural effusion identified. Diffuse hazy opacities are noted in the left lung. Airspace consolidation within the right upper lobe and left base is improved from previous exam. IMPRESSION: 1. Persistent left lung hazy opacities compatible with inflammation/infection. 2. Improving consolidation within the right upper lobe and left base. Electronically Signed   By: Kerby Moors M.D.   On: 10/16/2018 09:15   Dg Chest Port 1 View  Result Date: 10/12/2018 CLINICAL DATA:  Weakness and fever, history of COVID-19 EXAM: PORTABLE CHEST 1 VIEW COMPARISON:  10/11/2018 FINDINGS: Cardiac shadow is stable. Patchy infiltrates are seen bilaterally particularly in the right upper lobe consistent with that seen on recent CT examination and consistent with the patient's given clinical history. No sizable effusion is seen. No bony abnormality is noted. Elevation the right hemidiaphragm is again noted. IMPRESSION: Patchy infiltrates predominately within the right upper lobe consistent with the given clinical history of COVID-19 positivity. Electronically Signed   By: Inez Catalina M.D.   On: 10/12/2018 03:59   Dg Chest Portable 1 View  Result Date: 10/04/2018 CLINICAL DATA:  Symptoms of possible COVID-19 EXAM: PORTABLE CHEST 1  VIEW COMPARISON:  None. FINDINGS: The heart size and mediastinal contours are within normal limits. Both lungs are clear. The visualized skeletal structures are unremarkable. IMPRESSION: No active disease. Electronically Signed   By: Ulyses Jarred M.D.   On: 09/25/2018 22:55

## 2018-11-01 LAB — CBC WITH DIFFERENTIAL/PLATELET
Abs Immature Granulocytes: 0.09 10*3/uL — ABNORMAL HIGH (ref 0.00–0.07)
Basophils Absolute: 0 10*3/uL (ref 0.0–0.1)
Basophils Relative: 0 %
Eosinophils Absolute: 0 10*3/uL (ref 0.0–0.5)
Eosinophils Relative: 0 %
HCT: 47.5 % (ref 39.0–52.0)
Hemoglobin: 15.7 g/dL (ref 13.0–17.0)
Immature Granulocytes: 1 %
Lymphocytes Relative: 8 %
Lymphs Abs: 1.4 10*3/uL (ref 0.7–4.0)
MCH: 30.3 pg (ref 26.0–34.0)
MCHC: 33.1 g/dL (ref 30.0–36.0)
MCV: 91.7 fL (ref 80.0–100.0)
Monocytes Absolute: 0.9 10*3/uL (ref 0.1–1.0)
Monocytes Relative: 6 %
Neutro Abs: 13.7 10*3/uL — ABNORMAL HIGH (ref 1.7–7.7)
Neutrophils Relative %: 85 %
Platelets: 257 10*3/uL (ref 150–400)
RBC: 5.18 MIL/uL (ref 4.22–5.81)
RDW: 13.8 % (ref 11.5–15.5)
WBC: 16.1 10*3/uL — ABNORMAL HIGH (ref 4.0–10.5)
nRBC: 0 % (ref 0.0–0.2)

## 2018-11-01 LAB — FERRITIN: Ferritin: 208 ng/mL (ref 24–336)

## 2018-11-01 LAB — MAGNESIUM: Magnesium: 2.7 mg/dL — ABNORMAL HIGH (ref 1.7–2.4)

## 2018-11-01 LAB — LACTATE DEHYDROGENASE: LDH: 230 U/L — ABNORMAL HIGH (ref 98–192)

## 2018-11-01 LAB — D-DIMER, QUANTITATIVE: D-Dimer, Quant: 0.33 ug/mL-FEU (ref 0.00–0.50)

## 2018-11-01 LAB — COMPREHENSIVE METABOLIC PANEL
ALT: 39 U/L (ref 0–44)
AST: 22 U/L (ref 15–41)
Albumin: 3.4 g/dL — ABNORMAL LOW (ref 3.5–5.0)
Alkaline Phosphatase: 65 U/L (ref 38–126)
Anion gap: 10 (ref 5–15)
BUN: 50 mg/dL — ABNORMAL HIGH (ref 8–23)
CO2: 34 mmol/L — ABNORMAL HIGH (ref 22–32)
Calcium: 9.4 mg/dL (ref 8.9–10.3)
Chloride: 96 mmol/L — ABNORMAL LOW (ref 98–111)
Creatinine, Ser: 0.72 mg/dL (ref 0.61–1.24)
GFR calc Af Amer: >60 mL/min (ref >60)
GFR calc non Af Amer: >60 mL/min (ref >60)
Glucose, Bld: 72 mg/dL (ref 70–99)
Potassium: 4.5 mmol/L (ref 3.5–5.1)
Sodium: 140 mmol/L (ref 135–145)
Total Bilirubin: 0.8 mg/dL (ref 0.3–1.2)
Total Protein: 6 g/dL — ABNORMAL LOW (ref 6.5–8.1)

## 2018-11-01 LAB — GLUCOSE, CAPILLARY
Glucose-Capillary: 112 mg/dL — ABNORMAL HIGH (ref 70–99)
Glucose-Capillary: 182 mg/dL — ABNORMAL HIGH (ref 70–99)
Glucose-Capillary: 150 mg/dL — ABNORMAL HIGH (ref 70–99)
Glucose-Capillary: 98 mg/dL (ref 70–99)

## 2018-11-01 LAB — C-REACTIVE PROTEIN: CRP: <0.8 mg/dL (ref <1.0)

## 2018-11-01 LAB — BRAIN NATRIURETIC PEPTIDE: B Natriuretic Peptide: 58.8 pg/mL (ref 0.0–100.0)

## 2018-11-01 MED ORDER — INSULIN GLARGINE 100 UNIT/ML ~~LOC~~ SOLN
30.0000 [IU] | Freq: Every day | SUBCUTANEOUS | Status: DC
  Administered 2018-11-01 – 2018-11-02 (×2): 30 [IU] via SUBCUTANEOUS
  Filled 2018-11-01 (×4): qty 0.3

## 2018-11-01 NOTE — Progress Notes (Signed)
CardioVascular Research Department and AHF Team  ReDS Research Project   Patient #: [id number]  ReDS Measurement  Right: 39 %  Left: 20 %

## 2018-11-01 NOTE — Progress Notes (Signed)
PROGRESS NOTE                                                                                                                                                                                                             Patient Demographics:    Austin Page, is a 68 y.o. male, DOB - [birth year], XMI:[id number]  Outpatient Primary MD for the patient is Patient, [name]    LOS - 21  Admit date - 10/08/2018    Chief Complaint  Patient presents with  . Cough       Brief Narrative  68 year old male with no significant past medical history came into the hospital and was admitted on 10/14/2018 with 3 days of fever, chills, cough, vomiting and weakness in the setting of having a normal Covid contact.  In the ER he was febrile, tachycardic, tachypneic, underwent a CT angiogram which showed peripheral groundglass opacities.  He was Covid positive himself.  He had rapid deterioration of respiratory status requiring 15 L high flow nasal cannula and was transferred to the ICU on 5/25, he was transferred out of ICU on 10/19/2018 and transferred to my care on the same date.   Subjective:   Patient in bed, appears comfortable, denies any headache, no fever, no chest pain or pressure, no shortness of breath , no abdominal pain. No focal weakness.   Assessment  & Plan :     1. Acute Hypoxic Resp. Failure due to Acute Covid 19 Viral Illness during the ongoing 2020 Covid 19 Pandemic - has received appropriate treatment which include IV steroids, 2 doses of Actemra along with convalescent plasma.    Treatment so far  - Remdesivir started on 5/24 - Received Actemra x 2 on 5/24 and 5/25 - Received convalescent plasma on 5/25 - Continue IV Solu-Medrol was increased again on 10/25/2018  Hypoxia seems to be quite positional however he has shown little signs of improvement, with pruning and sitting up improves temporarily, currently on 15 L high  flow nasal cannula oxygen.  He has been between high flow 30 L to 4 L in the last few days, has been adequately diuresed, on IV steroids which will be continued dose was adjusted on 10/25/2018.  Continue sitting up in chair in the daytime with I-S and flutter valve for pulmonary toiletry and prone in bed  at night.    Unfortunately he continues to remain extremely tenuous and hypoxic.  Prognosis is poor he is DNR.  Daughter was updated by me personally on 10/25/2018.  Continue supportive care as he has already maximized his treatment, no further intubation.  Continue high flow nasal cannula oxygen as needed.  Monitor clinically.  I have updated family multiple times if he declines any further full goal of care will be comfort.  He is DNR.   COVID-19 Labs  Recent Labs    10/30/18 0300 10/31/18 0300 11/01/18 0620  DDIMER  --  0.36 0.33  FERRITIN  --  206 208  LDH 395* 282* 230*  CRP  --  <0.8 <0.8    Lab Results  Component Value Date   SARSCOV2NAA POSITIVE (A) 10/15/2018      Component Value Date/Time   BNP 58.8 11/01/2018 0620    Acute on chronic nonspecific CHF.  Could be mostly diastolic.  Does have bilateral rails on exam, no previous echo on file.  Diurese, low-dose beta-blocker and monitor.  Intermittent IV Lasix repeat 10/31/18, outpatient cardiology follow-up and echocardiogram.    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  Hypokalemia - replaced and stable   Rectal pain while having bowel movement.  Anusol cream.  Outpatient GI follow-up.    Mild anxiety.  Low-dose Klonopin.    Newly Diagnosed DM2 - poor outpatient control due to hyperglycemia A1c of 10.7, Lantus ISS dose was adjusted on 10/25/2018.  Continue to monitor and adjust as needed, DM insulin education has been ordered.  Lab Results  Component Value Date   HGBA1C 10.7 (H) 10/11/2018   CBG (last 3)  Recent Labs    10/31/18 1554 10/31/18 2100 11/01/18 0809  GLUCAP 357* 210* 112*       Condition - Extremely Guarded  Family Communication  : Daughter [name] by me over the phone on 10/25/2018, son il law Jaymin on 10/28/18, 10/29/18  Code Status :  DNR  Diet : Regular  Disposition Plan  :  LTAC   Consults  :  None  Procedures  :  None  PUD Prophylaxis : None  DVT Prophylaxis  :  Lovenox   Lab Results  Component Value Date   PLT 257 11/01/2018    Inpatient Medications  Scheduled Meds: . chlorhexidine  15 mL Mouth Rinse BID  . clonazepam  0.5 mg Oral BID  . enoxaparin (LOVENOX) injection  40 mg Subcutaneous Q12H  . feeding supplement (GLUCERNA SHAKE)  237 mL Oral BID WC  . hydrocortisone   Rectal TID  . insulin aspart  0-15 Units Subcutaneous TID WC  . insulin aspart  0-5 Units Subcutaneous QHS  . insulin aspart  6 Units Subcutaneous TID WC  . insulin glargine  30 Units Subcutaneous Daily  . insulin starter kit- syringes  1 kit Other Once  . isosorbide mononitrate  60 mg Oral Daily  . living well with diabetes book   Does not apply Once  . mouth rinse  15 mL Mouth Rinse q12n4p  . methylPREDNISolone (SOLU-MEDROL) injection  60 mg Intravenous Daily  . metoprolol tartrate  50 mg Oral BID   Continuous Infusions:  PRN Meds:.acetaminophen, albuterol, alum & mag hydroxide-simeth, guaiFENesin-dextromethorphan, lip balm, [DISCONTINUED] ondansetron **OR** ondansetron (ZOFRAN) IV, senna-docusate, sodium chloride  Antibiotics  :    Anti-infectives (From admission, onward)   Start     Dose/Rate Route Frequency Ordered Stop   10/24/18 2000  ceFEPIme (MAXIPIME) 2  g in sodium chloride 0.9 % 100 mL IVPB  Status:  Discontinued     2 g 200 mL/hr over 30 Minutes Intravenous Every 8 hours 10/24/18 1943 10/25/18 1222   10/13/18 1400  remdesivir 100 mg in sodium chloride 0.9 % 230 mL IVPB     100 mg over 30 Minutes Intravenous Every 24 hours 10/12/18 1113 10/16/18 1930   10/12/18 1400  remdesivir 200 mg in sodium chloride 0.9 % 210 mL IVPB     200 mg over 30 Minutes  Intravenous Once 10/12/18 1113 10/12/18 1749       Time Spent in minutes  30   Lala Lund M.D on 11/01/2018 at 9:56 AM  To page go to [email or web address] - password [facility]  [facility] -  Office  [phone]   See all Orders from today for further details    Objective:   Vitals:   10/31/18 1556 10/31/18 1930 11/01/18 0415 11/01/18 0700  BP: 107/76 120/89 (!) 129/92   Pulse: 100 (!) 103 98   Resp:   (!) 32   Temp: (!) 96.4 F (35.8 C) 98.4 F (36.9 C) 98.2 F (36.8 C) (!) 85 F (29.4 C)  TempSrc: Axillary Oral Oral   SpO2: (!) 86% 91% (!) 88%   Weight:      Height:        Wt Readings from Last 3 Encounters:  10/19/2018 72.6 kg     Intake/Output Summary (Last 24 hours) at 11/01/2018 0956 Last data filed at 11/01/2018 0600 Gross per 24 hour  Intake 120 ml  Output 625 ml  Net -505 ml     Physical Exam  Awake Alert, Oriented X 3, No new F.N deficits, Normal affect [place].AT,PERRAL Supple Neck,No JVD, No cervical lymphadenopathy appriciated.  Symmetrical Chest wall movement, Good air movement bilaterally, CTAB RRR,No Gallops, Rubs or new Murmurs, No Parasternal Heave +ve B.Sounds, Abd Soft, No tenderness, No organomegaly appriciated, No rebound - guarding or rigidity. No Cyanosis, Clubbing or edema, No new Rash or bruise    Data Review:    CBC Recent Labs  Lab 10/27/18 0305 10/28/18 0152 10/29/18 0309 10/30/18 0845 10/31/18 0300 11/01/18 0620  WBC 15.8* 18.4* 15.3* 14.6* 14.9* 16.1*  HGB 16.1 16.6 16.0 16.4 15.2 15.7  HCT 48.5 50.7 47.7 49.6 46.4 47.5  PLT 333 353 324 296 278 257  MCV 90.3 91.5 91.0 90.5 91.0 91.7  MCH 30.0 30.0 30.5 29.9 29.8 30.3  MCHC 33.2 32.7 33.5 33.1 32.8 33.1  RDW 13.7 13.8 13.8 13.8 13.8 13.8  LYMPHSABS 1.6 2.4 1.1  --  1.2 1.4  MONOABS 0.7 0.7 0.7  --  0.6 0.9  EOSABS 0.0 0.0 0.0  --  0.0 0.0  BASOSABS 0.0 0.0 0.0  --  0.0 0.0    Chemistries  Recent Labs  Lab 10/28/18 0152 10/29/18 0309 10/30/18 0845 10/31/18  0300 11/01/18 0620  NA 133* 132* 135 137 140  K 3.8 4.0 3.8 4.8 4.5  CL 90* 92* 92* 96* 96*  CO2 30 26 32 30 34*  GLUCOSE 93 238* 137* 103* 72  BUN 45* 43* 44* 49* 50*  CREATININE 0.69 0.68 0.72 0.65 0.72  CALCIUM 8.9 9.3 8.8* 9.1 9.4  MG 2.7* 2.3 2.5* 2.6* 2.7*  AST 25 22 32 22 22  ALT 41 35 44 39 39  ALKPHOS 71 82 70 62 65  BILITOT 0.8 1.2 0.6 0.7 0.8   ------------------------------------------------------------------------------------------------------------------ No results for input(s): CHOL, HDL, LDLCALC, TRIG, CHOLHDL, LDLDIRECT  in the last 72 hours.  Lab Results  Component Value Date   HGBA1C 10.7 (H) 10/11/2018   ------------------------------------------------------------------------------------------------------------------ No results for input(s): TSH, T4TOTAL, T3FREE, THYROIDAB in the last 72 hours.  Invalid input(s): FREET3  Cardiac Enzymes No results for input(s): CKMB, TROPONINI, MYOGLOBIN in the last 168 hours.  Invalid input(s): CK ------------------------------------------------------------------------------------------------------------------    Component Value Date/Time   BNP 58.8 11/01/2018 0620    Micro Results No results found for this or any previous visit (from the past 240 hour(s)).  Radiology Reports Ct Chest Wo Contrast  Result Date: 10/26/2018 CLINICAL DATA:  Acute respiratory illness, COVID-19 positive. EXAM: CT CHEST WITHOUT CONTRAST TECHNIQUE: Multidetector CT imaging of the chest was performed following the standard protocol without IV contrast. COMPARISON:  10/11/2018 FINDINGS: Cardiovascular: Heart is normal size. Calcified plaque over the left main, left anterior descending and lateral circumflex coronary arteries. Calcified plaque over the thoracic aorta. Remaining vascular structures are unremarkable. Mediastinum/Nodes: No significant mediastinal or hilar adenopathy. Remaining mediastinal structures are unremarkable. Lungs/Pleura:  Lungs are adequately inflated demonstrate moderate interval worsening of a patchy bilateral airspace process demonstrating ground-glass and crazy paving features and is mid to lower lung predominant. No effusion. Airways unremarkable. Upper Abdomen: No acute findings. Calcified plaque over the abdominal aorta. Musculoskeletal: Mild degenerative change of the spine. IMPRESSION: Interval worsening of bilateral multifocal airspace process worse over the mid to lower lungs compatible with worsening known viral pneumonia. Aortic Atherosclerosis (ICD10-I70.0). Atherosclerotic coronary artery disease. Electronically Signed   By: Marin Olp M.D.   On: 10/26/2018 15:43   Ct Angio Chest Pe W And/or Wo Contrast  Result Date: 10/11/2018 CLINICAL DATA:  68 y/o  M; cough, fever, chills.  COVID-19 positive. EXAM: CT ANGIOGRAPHY CHEST WITH CONTRAST TECHNIQUE: Multidetector CT imaging of the chest was performed using the standard protocol during bolus administration of intravenous contrast. Multiplanar CT image reconstructions and MIPs were obtained to evaluate the vascular anatomy. CONTRAST:  11m OMNIPAQUE IOHEXOL 300 MG/ML  SOLN COMPARISON:  09/21/2018 chest radiograph FINDINGS: Cardiovascular: Mild respiratory motion artifact. Satisfactory opacification of the pulmonary arteries to the segmental level. No evidence of pulmonary embolism. Normal heart size. No pericardial effusion. Mild aortic and moderate coronary artery calcific atherosclerosis. Mediastinum/Nodes: No enlarged mediastinal, hilar, or axillary lymph nodes. Thyroid gland, trachea, and esophagus demonstrate no significant findings. Lungs/Pleura: Multiple peripheral ground-glass opacities with the largest confluent opacity in the right upper lobe. The right upper lobe demonstrates intra lobular septal thickening "crazy paving" pattern. No pleural effusion or pneumothorax. Upper Abdomen: No acute abnormality. Musculoskeletal: No chest wall abnormality. No acute  or significant osseous findings. Review of the MIP images confirms the above findings. IMPRESSION: 1. Mild respiratory motion artifact. No pulmonary embolus identified. 2. There are a spectrum of findings in the lungs which can be seen with acute atypical infection (as well as other non-infectious etiologies). In particular, viral pneumonia (including COVID-19) should be considered in the appropriate clinical setting. Critical Value/emergent results were called by telephone at the time of interpretation on 10/11/2018 at 3:08 am to PA MCarrabelle, who verbally acknowledged these results. Electronically Signed   By: LKristine GarbeM.D.   On: 10/11/2018 03:21   Dg Chest Port 1 View  Result Date: 10/30/2018 CLINICAL DATA:  COPD, positive COVID-19. EXAM: PORTABLE CHEST 1 VIEW COMPARISON:  10/24/2018 and 09/28/2018 as well as chest CT 10/26/2018 FINDINGS: Lungs are somewhat hypoinflated and demonstrate bilateral mixed interstitial airspace opacification which is mid to lower lung predominant and  not significantly changed from the recent prior exam 10/24/2018. No effusion. Cardiomediastinal silhouette and remainder of the exam is unchanged. IMPRESSION: Stable bilateral mixed interstitial airspace process likely due to viral pneumonia in this patient who is known COVID-19 positive. Electronically Signed   By: Marin Olp M.D.   On: 10/30/2018 08:41   Dg Chest Port 1 View  Result Date: 10/24/2018 CLINICAL DATA:  Shortness of breath.  COVID-19. EXAM: PORTABLE CHEST 1 VIEW COMPARISON:  Chest x-rays dated 10/16/2018, 10/12/2018 and 10/16/2018 FINDINGS: The bilateral pulmonary infiltrates have progressed. Heart size and vascularity are normal. No effusions. No significant bone abnormality. Aortic atherosclerosis. IMPRESSION: Progression of the bilateral hazy pulmonary infiltrates since the prior study of 05/20 Aortic Atherosclerosis (ICD10-I70.0). Electronically Signed   By: Lorriane Shire M.D.   On:  10/24/2018 08:46   Dg Chest Port 1 View  Result Date: 10/19/2018 CLINICAL DATA:  Shortness of breath EXAM: PORTABLE CHEST 1 VIEW COMPARISON:  10/16/2018 FINDINGS: Multifocal airspace opacities, improved in the upper lungs bilaterally, persistent at the lung bases. This appearance favors improving multifocal pneumonia. No pleural effusion or pneumothorax. The heart is normal in size. IMPRESSION: Improving multifocal pneumonia, as above. Electronically Signed   By: Julian Hy M.D.   On: 10/19/2018 10:21   Dg Chest Port 1 View  Result Date: 10/16/2018 CLINICAL DATA:  Dyspnea.COVID19 EXAM: PORTABLE CHEST 1 VIEW COMPARISON:  10/12/2018 FINDINGS: Heart size appears normal. No pleural effusion identified. Diffuse hazy opacities are noted in the left lung. Airspace consolidation within the right upper lobe and left base is improved from previous exam. IMPRESSION: 1. Persistent left lung hazy opacities compatible with inflammation/infection. 2. Improving consolidation within the right upper lobe and left base. Electronically Signed   By: Kerby Moors M.D.   On: 10/16/2018 09:15   Dg Chest Port 1 View  Result Date: 10/12/2018 CLINICAL DATA:  Weakness and fever, history of COVID-19 EXAM: PORTABLE CHEST 1 VIEW COMPARISON:  10/11/2018 FINDINGS: Cardiac shadow is stable. Patchy infiltrates are seen bilaterally particularly in the right upper lobe consistent with that seen on recent CT examination and consistent with the patient's given clinical history. No sizable effusion is seen. No bony abnormality is noted. Elevation the right hemidiaphragm is again noted. IMPRESSION: Patchy infiltrates predominately within the right upper lobe consistent with the given clinical history of COVID-19 positivity. Electronically Signed   By: Inez Catalina M.D.   On: 10/12/2018 03:59   Dg Chest Portable 1 View  Result Date: 09/19/2018 CLINICAL DATA:  Symptoms of possible COVID-19 EXAM: PORTABLE CHEST 1 VIEW COMPARISON:   None. FINDINGS: The heart size and mediastinal contours are within normal limits. Both lungs are clear. The visualized skeletal structures are unremarkable. IMPRESSION: No active disease. Electronically Signed   By: Ulyses Jarred M.D.   On: 10/04/2018 22:55

## 2018-11-02 ENCOUNTER — Inpatient Hospital Stay (HOSPITAL_COMMUNITY): Payer: BC Managed Care – PPO

## 2018-11-02 LAB — COMPREHENSIVE METABOLIC PANEL
ALT: 37 U/L (ref 0–44)
AST: 22 U/L (ref 15–41)
Albumin: 3.2 g/dL — ABNORMAL LOW (ref 3.5–5.0)
Alkaline Phosphatase: 66 U/L (ref 38–126)
Anion gap: 12 (ref 5–15)
BUN: 40 mg/dL — ABNORMAL HIGH (ref 8–23)
CO2: 33 mmol/L — ABNORMAL HIGH (ref 22–32)
Calcium: 9.2 mg/dL (ref 8.9–10.3)
Chloride: 95 mmol/L — ABNORMAL LOW (ref 98–111)
Creatinine, Ser: 0.7 mg/dL (ref 0.61–1.24)
GFR calc Af Amer: >60 mL/min (ref >60)
GFR calc non Af Amer: >60 mL/min (ref >60)
Glucose, Bld: 100 mg/dL — ABNORMAL HIGH (ref 70–99)
Potassium: 4.8 mmol/L (ref 3.5–5.1)
Sodium: 140 mmol/L (ref 135–145)
Total Bilirubin: 0.8 mg/dL (ref 0.3–1.2)
Total Protein: 6 g/dL — ABNORMAL LOW (ref 6.5–8.1)

## 2018-11-02 LAB — D-DIMER, QUANTITATIVE: D-Dimer, Quant: 0.35 ug/mL-FEU (ref 0.00–0.50)

## 2018-11-02 LAB — CBC WITH DIFFERENTIAL/PLATELET
Abs Immature Granulocytes: 0.08 10*3/uL — ABNORMAL HIGH (ref 0.00–0.07)
Basophils Absolute: 0 10*3/uL (ref 0.0–0.1)
Basophils Relative: 0 %
Eosinophils Absolute: 0 10*3/uL (ref 0.0–0.5)
Eosinophils Relative: 0 %
HCT: 49.6 % (ref 39.0–52.0)
Hemoglobin: 16.4 g/dL (ref 13.0–17.0)
Immature Granulocytes: 1 %
Lymphocytes Relative: 7 %
Lymphs Abs: 1.1 10*3/uL (ref 0.7–4.0)
MCH: 30.8 pg (ref 26.0–34.0)
MCHC: 33.1 g/dL (ref 30.0–36.0)
MCV: 93.2 fL (ref 80.0–100.0)
Monocytes Absolute: 0.7 10*3/uL (ref 0.1–1.0)
Monocytes Relative: 5 %
Neutro Abs: 13.7 10*3/uL — ABNORMAL HIGH (ref 1.7–7.7)
Neutrophils Relative %: 87 %
Platelets: 236 10*3/uL (ref 150–400)
RBC: 5.32 MIL/uL (ref 4.22–5.81)
RDW: 14.3 % (ref 11.5–15.5)
WBC: 15.6 10*3/uL — ABNORMAL HIGH (ref 4.0–10.5)
nRBC: 0 % (ref 0.0–0.2)

## 2018-11-02 LAB — MAGNESIUM: Magnesium: 2.7 mg/dL — ABNORMAL HIGH (ref 1.7–2.4)

## 2018-11-02 LAB — GLUCOSE, CAPILLARY
Glucose-Capillary: 131 mg/dL — ABNORMAL HIGH (ref 70–99)
Glucose-Capillary: 201 mg/dL — ABNORMAL HIGH (ref 70–99)
Glucose-Capillary: 107 mg/dL — ABNORMAL HIGH (ref 70–99)

## 2018-11-02 LAB — LACTATE DEHYDROGENASE: LDH: 260 U/L — ABNORMAL HIGH (ref 98–192)

## 2018-11-02 LAB — FERRITIN: Ferritin: 177 ng/mL (ref 24–336)

## 2018-11-02 LAB — BRAIN NATRIURETIC PEPTIDE: B Natriuretic Peptide: 59.1 pg/mL (ref 0.0–100.0)

## 2018-11-02 LAB — C-REACTIVE PROTEIN: CRP: <0.8 mg/dL (ref <1.0)

## 2018-11-02 MED ORDER — MILK AND MOLASSES ENEMA
1.0000 | Freq: Once | RECTAL | Status: DC
  Filled 2018-11-02: qty 240

## 2018-11-02 MED ORDER — POLYETHYLENE GLYCOL 3350 17 G PO PACK
17.0000 g | PACK | Freq: Two times a day (BID) | ORAL | Status: DC
  Administered 2018-11-02 – 2018-11-07 (×8): 17 g via ORAL
  Filled 2018-11-02 (×10): qty 1

## 2018-11-02 MED ORDER — DOCUSATE SODIUM 100 MG PO CAPS
200.0000 mg | ORAL_CAPSULE | Freq: Two times a day (BID) | ORAL | Status: DC
  Administered 2018-11-02 – 2018-11-07 (×8): 200 mg via ORAL
  Filled 2018-11-02 (×9): qty 2

## 2018-11-02 MED ORDER — FUROSEMIDE 10 MG/ML IJ SOLN
40.0000 mg | Freq: Once | INTRAMUSCULAR | Status: AC
  Administered 2018-11-02: 40 mg via INTRAVENOUS
  Filled 2018-11-02: qty 4

## 2018-11-02 MED ORDER — LACTULOSE 10 GM/15ML PO SOLN
20.0000 g | Freq: Two times a day (BID) | ORAL | Status: DC
  Administered 2018-11-02 (×2): 20 g via ORAL
  Filled 2018-11-02 (×2): qty 30

## 2018-11-02 MED ORDER — BISACODYL 10 MG RE SUPP
10.0000 mg | Freq: Once | RECTAL | Status: AC
  Administered 2018-11-02: 10 mg via RECTAL
  Filled 2018-11-02: qty 1

## 2018-11-02 MED ORDER — SENNOSIDES-DOCUSATE SODIUM 8.6-50 MG PO TABS
1.0000 | ORAL_TABLET | Freq: Once | ORAL | Status: AC
  Administered 2018-11-02: 1 via ORAL
  Filled 2018-11-02: qty 1

## 2018-11-02 NOTE — Progress Notes (Signed)
CardioVascular Research Department and AHF Team  ReDS Research Project   Patient #: [id number]  ReDS Measurement  Right: 37 %  Left: 34 %

## 2018-11-02 NOTE — Progress Notes (Signed)
   11/02/18 0215  Provider Notification  Provider Name/Title Dr. Sarajane Jews  Date Provider Notified 11/02/18  Notification Type Page  Notification Reason Change in status  Response See new orders  Date of Provider Response 11/02/18   Patient moaning and complaining of severe rectal pain while attempting to have a bowel movement.  Last documented BM was 10/29/2018.  Positive bowel sounds to all four quadrants.  No external hemorrhoids noted.  Patient insisted on getting to [facility].  While on BSC, his O2 went down to 70s on 15 liters HFNC.  He was finally able to have small pellet like hard stool.  Called Dr. Sarajane Jews.  Miralax and Ducoloax Suppository given.  KUB ordered.  Will continue to monitor patient.  Earleen Reaper RN

## 2018-11-02 NOTE — Progress Notes (Signed)
   11/02/18 0501  Provider Notification  Provider Name/Title Dr. Sarajane Jews  Date Provider Notified 11/02/18  Time Provider Notified 0501  Notification Type Page  Notification Reason Change in status  Response See new orders  Date of Provider Response 11/02/18   Patient calling multiple times asking for nurse.  Upon assessment, he is moaning and wanting the pain in his rectum to stop.  Attempted to disimpact patient manually and was able to get a small amount of brown stool.  He is restless while in bed and his oxygen level on HFNC is dropping into the 60s.  Placed patient on non rebreather and oxygen levels only coming up to 70s.  Patient is insistent on getting on the [facility].  His oxygen levels again drop into the 60s.  Placed on both non rebreather and HFNC and oxygen levels only into the 70s.  Dr. Sarajane Jews called and made aware of situation.  Milk of magnesium enema ordered but we will have to wait for retention enema to be delivered via courier from [facility].  In the meantime, patient is able to have medium soft brown bowel movement.  He is cleaned and assisted back to bed.  Oxygen levels remain in the low 70s.  We finally convince patient to lay in the prone position.  We are able to get oxygen levels to 90s on 15 liters HFNC.  Will continue to monitor patient.  Earleen Reaper RN

## 2018-11-02 NOTE — Progress Notes (Signed)
PROGRESS NOTE                                                                                                                                                                                                             Patient Demographics:    Austin Page, is a 68 y.o. male, DOB - 1950-08-30, LPF:[id number]  Outpatient Primary MD for the patient is Patient, [name]    LOS - 22  Admit date - 10/14/2018    Chief Complaint  Patient presents with  . Cough       Brief Narrative  68 year old male with no significant past medical history came into the hospital and was admitted on 10/19/2018 with 3 days of fever, chills, cough, vomiting and weakness in the setting of having a normal Covid contact.  In the ER he was febrile, tachycardic, tachypneic, underwent a CT angiogram which showed peripheral groundglass opacities.  He was Covid positive himself.  He had rapid deterioration of respiratory status requiring 15 L high flow nasal cannula and was transferred to the ICU on 5/25, he was transferred out of ICU on 10/19/2018 and transferred to my care on the same date.   Subjective:   Patient in bed, appears comfortable, denies any headache, no fever, no chest pain or pressure,+ve shortness of breath , no abdominal pain. No focal weakness.   Assessment  & Plan :     1. Acute Hypoxic Resp. Failure due to Acute Covid 19 Viral Illness during the ongoing 2020 Covid 19 Pandemic - has received appropriate treatment which include IV steroids, 2 doses of Actemra along with convalescent plasma.    Treatment so far  - Remdesivir started on 5/24 - Received Actemra x 2 on 5/24 and 5/25 - Received convalescent plasma on 5/25 - Continue IV Solu-Medrol was increased again on 10/25/2018  Hypoxia seems to be quite positional however he has shown little signs of improvement, with pruning and sitting up improves temporarily, currently on 15 L high  flow nasal cannula oxygen.  He has been between high flow 30 L to 4 L in the last few days, has been adequately diuresed, on IV steroids which will be continued dose was adjusted on 10/25/2018.  Continue sitting up in chair in the daytime with I-S and flutter valve for pulmonary toiletry and prone in bed at  night.    Unfortunately he continues to remain extremely tenuous and hypoxic.  Prognosis is poor he is DNR.  Daughter was updated by me personally on 10/25/2018.  Continue supportive care as he has already maximized his treatment, no further intubation.  Continue high flow nasal cannula oxygen as needed.  Monitor clinically.  I have updated family multiple times if he declines any further full goal of care will be comfort.  He is DNR.   COVID-19 Labs  Recent Labs    10/31/18 0300 11/01/18 0620 11/02/18 0330  DDIMER 0.36 0.33 0.35  FERRITIN 206 208 177  LDH 282* 230* 260*  CRP <0.8 <0.8 <0.8    Lab Results  Component Value Date   SARSCOV2NAA POSITIVE (A) 09/24/2018      Component Value Date/Time   BNP 59.1 11/02/2018 0330    Acute on chronic nonspecific CHF.  Could be mostly diastolic.  Does have bilateral rails on exam, no previous echo on file.  Diurese, low-dose beta-blocker and monitor.  Intermittent IV Lasix repeat 10/31/18, outpatient cardiology follow-up and echocardiogram.    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  Hypokalemia - replaced and stable   Rectal pain while having bowel movement.  Anusol cream.  Outpatient GI follow-up.    Mild anxiety.  Low-dose Klonopin.    Constipation with moderate stool burden noted on x-ray on 11/02/2018.  Started on aggressive bowel regimen, already having good results we will continue to monitor.    Newly Diagnosed DM2 - poor outpatient control due to hyperglycemia A1c of 10.7, Lantus ISS dose was adjusted on 10/25/2018.  Continue to monitor and adjust as needed, DM insulin education has been ordered.  Lab  Results  Component Value Date   HGBA1C 10.7 (H) 10/11/2018   CBG (last 3)  Recent Labs    11/01/18 1552 11/01/18 2058 11/02/18 0738  GLUCAP 150* 98 131*      Condition - Extremely Guarded  Family Communication  : Daughter [name] by me over the phone on 10/25/2018, son il law Jaymin on 10/28/18, 10/29/18  Code Status :  DNR  Diet : Regular  Disposition Plan  :  LTAC   Consults  :  None  Procedures  :  None  PUD Prophylaxis : None  DVT Prophylaxis  :  Lovenox   Lab Results  Component Value Date   PLT 236 11/02/2018    Inpatient Medications  Scheduled Meds: . chlorhexidine  15 mL Mouth Rinse BID  . clonazepam  0.5 mg Oral BID  . docusate sodium  200 mg Oral BID  . enoxaparin (LOVENOX) injection  40 mg Subcutaneous Q12H  . feeding supplement (GLUCERNA SHAKE)  237 mL Oral BID WC  . hydrocortisone   Rectal TID  . insulin aspart  0-15 Units Subcutaneous TID WC  . insulin aspart  0-5 Units Subcutaneous QHS  . insulin aspart  6 Units Subcutaneous TID WC  . insulin glargine  30 Units Subcutaneous Daily  . insulin starter kit- syringes  1 kit Other Once  . isosorbide mononitrate  60 mg Oral Daily  . lactulose  20 g Oral BID  . living well with diabetes book   Does not apply Once  . mouth rinse  15 mL Mouth Rinse q12n4p  . methylPREDNISolone (SOLU-MEDROL) injection  60 mg Intravenous Daily  . metoprolol tartrate  50 mg Oral BID  . milk and molasses  1 enema Rectal Once  . polyethylene glycol  17 g  Oral BID   Continuous Infusions:  PRN Meds:.acetaminophen, albuterol, alum & mag hydroxide-simeth, guaiFENesin-dextromethorphan, lip balm, [DISCONTINUED] ondansetron **OR** ondansetron (ZOFRAN) IV, sodium chloride  Antibiotics  :    Anti-infectives (From admission, onward)   Start     Dose/Rate Route Frequency Ordered Stop   10/24/18 2000  ceFEPIme (MAXIPIME) 2 g in sodium chloride 0.9 % 100 mL IVPB  Status:  Discontinued     2 g 200 mL/hr over 30 Minutes Intravenous  Every 8 hours 10/24/18 1943 10/25/18 1222   10/13/18 1400  remdesivir 100 mg in sodium chloride 0.9 % 230 mL IVPB     100 mg over 30 Minutes Intravenous Every 24 hours 10/12/18 1113 10/16/18 1930   10/12/18 1400  remdesivir 200 mg in sodium chloride 0.9 % 210 mL IVPB     200 mg over 30 Minutes Intravenous Once 10/12/18 1113 10/12/18 1749       Time Spent in minutes  30   Lala Lund M.D on 11/02/2018 at 9:34 AM  To page go to [email or web address] - password [facility]  [facility] -  Office  [phone]   See all Orders from today for further details    Objective:   Vitals:   11/02/18 0600 11/02/18 0643 11/02/18 0700 11/02/18 0740  BP:      Pulse: (!) 116 (!) 101    Resp:      Temp:   (!) 90 F (32.2 C) 98.1 F (36.7 C)  TempSrc:    Axillary  SpO2: (!) 76% 92%  92%  Weight:      Height:        Wt Readings from Last 3 Encounters:  10/16/2018 72.6 kg     Intake/Output Summary (Last 24 hours) at 11/02/2018 0934 Last data filed at 11/02/2018 0600 Gross per 24 hour  Intake 150 ml  Output 325 ml  Net -175 ml     Physical Exam  Awake Alert, Oriented X 3, No new F.N deficits, Normal affect Lynn.AT,PERRAL Supple Neck,No JVD, No cervical lymphadenopathy appriciated.  Symmetrical Chest wall movement, Good air movement bilaterally, few rales RRR,No Gallops, Rubs or new Murmurs, No Parasternal Heave +ve B.Sounds, Abd Soft, No tenderness, No organomegaly appriciated, No rebound - guarding or rigidity. No Cyanosis, Clubbing or edema, No new Rash or bruise    Data Review:    CBC Recent Labs  Lab 10/28/18 0152 10/29/18 0309 10/30/18 0845 10/31/18 0300 11/01/18 0620 11/02/18 0330  WBC 18.4* 15.3* 14.6* 14.9* 16.1* 15.6*  HGB 16.6 16.0 16.4 15.2 15.7 16.4  HCT 50.7 47.7 49.6 46.4 47.5 49.6  PLT 353 324 296 278 257 236  MCV 91.5 91.0 90.5 91.0 91.7 93.2  MCH 30.0 30.5 29.9 29.8 30.3 30.8  MCHC 32.7 33.5 33.1 32.8 33.1 33.1  RDW 13.8 13.8 13.8 13.8 13.8 14.3   LYMPHSABS 2.4 1.1  --  1.2 1.4 1.1  MONOABS 0.7 0.7  --  0.6 0.9 0.7  EOSABS 0.0 0.0  --  0.0 0.0 0.0  BASOSABS 0.0 0.0  --  0.0 0.0 0.0    Chemistries  Recent Labs  Lab 10/29/18 0309 10/30/18 0845 10/31/18 0300 11/01/18 0620 11/02/18 0330  NA 132* 135 137 140 140  K 4.0 3.8 4.8 4.5 4.8  CL 92* 92* 96* 96* 95*  CO2 26 32 30 34* 33*  GLUCOSE 238* 137* 103* 72 100*  BUN 43* 44* 49* 50* 40*  CREATININE 0.68 0.72 0.65 0.72 0.70  CALCIUM 9.3 8.8* 9.1 9.4 9.2  MG 2.3 2.5* 2.6* 2.7* 2.7*  AST 22 32 22 22 22   ALT 35 44 39 39 37  ALKPHOS 82 70 62 65 66  BILITOT 1.2 0.6 0.7 0.8 0.8   ------------------------------------------------------------------------------------------------------------------ No results for input(s): CHOL, HDL, LDLCALC, TRIG, CHOLHDL, LDLDIRECT in the last 72 hours.  Lab Results  Component Value Date   HGBA1C 10.7 (H) 10/11/2018   ------------------------------------------------------------------------------------------------------------------ No results for input(s): TSH, T4TOTAL, T3FREE, THYROIDAB in the last 72 hours.  Invalid input(s): FREET3  Cardiac Enzymes No results for input(s): CKMB, TROPONINI, MYOGLOBIN in the last 168 hours.  Invalid input(s): CK ------------------------------------------------------------------------------------------------------------------    Component Value Date/Time   BNP 59.1 11/02/2018 0330    Micro Results No results found for this or any previous visit (from the past 240 hour(s)).  Radiology Reports Dg Abd 1 View  Result Date: 11/02/2018 CLINICAL DATA:  Abdominal pain and constipation EXAM: ABDOMEN - 1 VIEW COMPARISON:  None. FINDINGS: There is a moderate amount of stool throughout the colon. The bowel gas pattern is nonobstructive. There is no acute osseous abnormality. IMPRESSION: Moderate stool burden. Electronically Signed   By: Constance Holster M.D.   On: 11/02/2018 02:56   Ct Chest Wo Contrast   Result Date: 10/26/2018 CLINICAL DATA:  Acute respiratory illness, COVID-19 positive. EXAM: CT CHEST WITHOUT CONTRAST TECHNIQUE: Multidetector CT imaging of the chest was performed following the standard protocol without IV contrast. COMPARISON:  10/11/2018 FINDINGS: Cardiovascular: Heart is normal size. Calcified plaque over the left main, left anterior descending and lateral circumflex coronary arteries. Calcified plaque over the thoracic aorta. Remaining vascular structures are unremarkable. Mediastinum/Nodes: No significant mediastinal or hilar adenopathy. Remaining mediastinal structures are unremarkable. Lungs/Pleura: Lungs are adequately inflated demonstrate moderate interval worsening of a patchy bilateral airspace process demonstrating ground-glass and crazy paving features and is mid to lower lung predominant. No effusion. Airways unremarkable. Upper Abdomen: No acute findings. Calcified plaque over the abdominal aorta. Musculoskeletal: Mild degenerative change of the spine. IMPRESSION: Interval worsening of bilateral multifocal airspace process worse over the mid to lower lungs compatible with worsening known viral pneumonia. Aortic Atherosclerosis (ICD10-I70.0). Atherosclerotic coronary artery disease. Electronically Signed   By: Marin Olp M.D.   On: 10/26/2018 15:43   Ct Angio Chest Pe W And/or Wo Contrast  Result Date: 10/11/2018 CLINICAL DATA:  68 y/o  M; cough, fever, chills.  COVID-19 positive. EXAM: CT ANGIOGRAPHY CHEST WITH CONTRAST TECHNIQUE: Multidetector CT imaging of the chest was performed using the standard protocol during bolus administration of intravenous contrast. Multiplanar CT image reconstructions and MIPs were obtained to evaluate the vascular anatomy. CONTRAST:  137m OMNIPAQUE IOHEXOL 300 MG/ML  SOLN COMPARISON:  09/27/2018 chest radiograph FINDINGS: Cardiovascular: Mild respiratory motion artifact. Satisfactory opacification of the pulmonary arteries to the segmental  level. No evidence of pulmonary embolism. Normal heart size. No pericardial effusion. Mild aortic and moderate coronary artery calcific atherosclerosis. Mediastinum/Nodes: No enlarged mediastinal, hilar, or axillary lymph nodes. Thyroid gland, trachea, and esophagus demonstrate no significant findings. Lungs/Pleura: Multiple peripheral ground-glass opacities with the largest confluent opacity in the right upper lobe. The right upper lobe demonstrates intra lobular septal thickening "crazy paving" pattern. No pleural effusion or pneumothorax. Upper Abdomen: No acute abnormality. Musculoskeletal: No chest wall abnormality. No acute or significant osseous findings. Review of the MIP images confirms the above findings. IMPRESSION: 1. Mild respiratory motion artifact. No pulmonary embolus identified. 2. There are a spectrum of findings in the lungs which can be seen with acute atypical infection (as  well as other non-infectious etiologies). In particular, viral pneumonia (including COVID-19) should be considered in the appropriate clinical setting. Critical Value/emergent results were called by telephone at the time of interpretation on 10/11/2018 at 3:08 am to PA Karn[place] , who verbally acknowledged these results. Electronically Signed   By: Kristine Garbe M.D.   On: 10/11/2018 03:21   Dg Chest Port 1 View  Result Date: 10/30/2018 CLINICAL DATA:  COPD, positive COVID-19. EXAM: PORTABLE CHEST 1 VIEW COMPARISON:  10/24/2018 and 09/28/2018 as well as chest CT 10/26/2018 FINDINGS: Lungs are somewhat hypoinflated and demonstrate bilateral mixed interstitial airspace opacification which is mid to lower lung predominant and not significantly changed from the recent prior exam 10/24/2018. No effusion. Cardiomediastinal silhouette and remainder of the exam is unchanged. IMPRESSION: Stable bilateral mixed interstitial airspace process likely due to viral pneumonia in this patient who is known COVID-19 positive.  Electronically Signed   By: Marin Olp M.D.   On: 10/30/2018 08:41   Dg Chest Port 1 View  Result Date: 10/24/2018 CLINICAL DATA:  Shortness of breath.  COVID-19. EXAM: PORTABLE CHEST 1 VIEW COMPARISON:  Chest x-rays dated 10/16/2018, 10/12/2018 and 09/19/2018 FINDINGS: The bilateral pulmonary infiltrates have progressed. Heart size and vascularity are normal. No effusions. No significant bone abnormality. Aortic atherosclerosis. IMPRESSION: Progression of the bilateral hazy pulmonary infiltrates since the prior study of 05/20 Aortic Atherosclerosis (ICD10-I70.0). Electronically Signed   By: Lorriane Shire M.D.   On: 10/24/2018 08:46   Dg Chest Port 1 View  Result Date: 10/19/2018 CLINICAL DATA:  Shortness of breath EXAM: PORTABLE CHEST 1 VIEW COMPARISON:  10/16/2018 FINDINGS: Multifocal airspace opacities, improved in the upper lungs bilaterally, persistent at the lung bases. This appearance favors improving multifocal pneumonia. No pleural effusion or pneumothorax. The heart is normal in size. IMPRESSION: Improving multifocal pneumonia, as above. Electronically Signed   By: Julian Hy M.D.   On: 10/19/2018 10:21   Dg Chest Port 1 View  Result Date: 10/16/2018 CLINICAL DATA:  Dyspnea.COVID19 EXAM: PORTABLE CHEST 1 VIEW COMPARISON:  10/12/2018 FINDINGS: Heart size appears normal. No pleural effusion identified. Diffuse hazy opacities are noted in the left lung. Airspace consolidation within the right upper lobe and left base is improved from previous exam. IMPRESSION: 1. Persistent left lung hazy opacities compatible with inflammation/infection. 2. Improving consolidation within the right upper lobe and left base. Electronically Signed   By: Kerby Moors M.D.   On: 10/16/2018 09:15   Dg Chest Port 1 View  Result Date: 10/12/2018 CLINICAL DATA:  Weakness and fever, history of COVID-19 EXAM: PORTABLE CHEST 1 VIEW COMPARISON:  10/11/2018 FINDINGS: Cardiac shadow is stable. Patchy infiltrates  are seen bilaterally particularly in the right upper lobe consistent with that seen on recent CT examination and consistent with the patient's given clinical history. No sizable effusion is seen. No bony abnormality is noted. Elevation the right hemidiaphragm is again noted. IMPRESSION: Patchy infiltrates predominately within the right upper lobe consistent with the given clinical history of COVID-19 positivity. Electronically Signed   By: Inez Catalina M.D.   On: 10/12/2018 03:59   Dg Chest Portable 1 View  Result Date: 09/26/2018 CLINICAL DATA:  Symptoms of possible COVID-19 EXAM: PORTABLE CHEST 1 VIEW COMPARISON:  None. FINDINGS: The heart size and mediastinal contours are within normal limits. Both lungs are clear. The visualized skeletal structures are unremarkable. IMPRESSION: No active disease. Electronically Signed   By: Ulyses Jarred M.D.   On: 09/30/2018 22:55

## 2018-11-02 NOTE — Progress Notes (Signed)
OT Cancellation Note  Patient Details Name: Austin Page MRN: [id number] DOB: 1950-10-07   Cancelled Treatment:    Reason Eval/Treat Not Completed: Medical issues which prohibited therapy; spoke with RN, pt had rough night, still with respiratory difficulties. Request to hold OT eval today. Will follow up for OT eval as schedule permits.  Lou Cal, OT Supplemental Rehabilitation Services Pager [phone] Office [place] 11/02/2018, 10:30 AM

## 2018-11-03 ENCOUNTER — Ambulatory Visit: Payer: MEDICAID | Admitting: Family Medicine

## 2018-11-03 LAB — COMPREHENSIVE METABOLIC PANEL
ALT: 38 U/L (ref 0–44)
AST: 26 U/L (ref 15–41)
Albumin: 3.4 g/dL — ABNORMAL LOW (ref 3.5–5.0)
Alkaline Phosphatase: 72 U/L (ref 38–126)
Anion gap: 13 (ref 5–15)
BUN: 40 mg/dL — ABNORMAL HIGH (ref 8–23)
CO2: 33 mmol/L — ABNORMAL HIGH (ref 22–32)
Calcium: 9.5 mg/dL (ref 8.9–10.3)
Chloride: 95 mmol/L — ABNORMAL LOW (ref 98–111)
Creatinine, Ser: 0.71 mg/dL (ref 0.61–1.24)
GFR calc Af Amer: >60 mL/min (ref >60)
GFR calc non Af Amer: >60 mL/min (ref >60)
Glucose, Bld: 33 mg/dL — CL (ref 70–99)
Potassium: 4.2 mmol/L (ref 3.5–5.1)
Sodium: 141 mmol/L (ref 135–145)
Total Bilirubin: 0.8 mg/dL (ref 0.3–1.2)
Total Protein: 6.2 g/dL — ABNORMAL LOW (ref 6.5–8.1)

## 2018-11-03 LAB — CBC WITH DIFFERENTIAL/PLATELET
Abs Immature Granulocytes: 0.1 10*3/uL — ABNORMAL HIGH (ref 0.00–0.07)
Basophils Absolute: 0 10*3/uL (ref 0.0–0.1)
Basophils Relative: 0 %
Eosinophils Absolute: 0 10*3/uL (ref 0.0–0.5)
Eosinophils Relative: 0 %
HCT: 52 % (ref 39.0–52.0)
Hemoglobin: 16.6 g/dL (ref 13.0–17.0)
Immature Granulocytes: 1 %
Lymphocytes Relative: 10 %
Lymphs Abs: 1.8 10*3/uL (ref 0.7–4.0)
MCH: 30.1 pg (ref 26.0–34.0)
MCHC: 31.9 g/dL (ref 30.0–36.0)
MCV: 94.2 fL (ref 80.0–100.0)
Monocytes Absolute: 1.1 10*3/uL — ABNORMAL HIGH (ref 0.1–1.0)
Monocytes Relative: 6 %
Neutro Abs: 14.6 10*3/uL — ABNORMAL HIGH (ref 1.7–7.7)
Neutrophils Relative %: 83 %
Platelets: 254 10*3/uL (ref 150–400)
RBC: 5.52 MIL/uL (ref 4.22–5.81)
RDW: 14.4 % (ref 11.5–15.5)
WBC: 17.6 10*3/uL — ABNORMAL HIGH (ref 4.0–10.5)
nRBC: 0 % (ref 0.0–0.2)

## 2018-11-03 LAB — GLUCOSE, CAPILLARY
Glucose-Capillary: 152 mg/dL — ABNORMAL HIGH (ref 70–99)
Glucose-Capillary: 70 mg/dL (ref 70–99)
Glucose-Capillary: 201 mg/dL — ABNORMAL HIGH (ref 70–99)
Glucose-Capillary: 153 mg/dL — ABNORMAL HIGH (ref 70–99)
Glucose-Capillary: 275 mg/dL — ABNORMAL HIGH (ref 70–99)
Glucose-Capillary: 122 mg/dL — ABNORMAL HIGH (ref 70–99)

## 2018-11-03 LAB — FERRITIN: Ferritin: 203 ng/mL (ref 24–336)

## 2018-11-03 LAB — BRAIN NATRIURETIC PEPTIDE: B Natriuretic Peptide: 62.9 pg/mL (ref 0.0–100.0)

## 2018-11-03 LAB — LACTATE DEHYDROGENASE: LDH: 289 U/L — ABNORMAL HIGH (ref 98–192)

## 2018-11-03 LAB — D-DIMER, QUANTITATIVE: D-Dimer, Quant: 0.41 ug/mL-FEU (ref 0.00–0.50)

## 2018-11-03 LAB — MAGNESIUM: Magnesium: 2.8 mg/dL — ABNORMAL HIGH (ref 1.7–2.4)

## 2018-11-03 LAB — C-REACTIVE PROTEIN: CRP: <0.8 mg/dL (ref <1.0)

## 2018-11-03 MED ORDER — LACTULOSE 10 GM/15ML PO SOLN
20.0000 g | Freq: Every day | ORAL | Status: DC
  Administered 2018-11-03 – 2018-11-07 (×4): 20 g via ORAL
  Filled 2018-11-03 (×5): qty 30

## 2018-11-03 MED ORDER — DEXTROSE 50 % IV SOLN
25.0000 mL | Freq: Once | INTRAVENOUS | Status: AC
  Administered 2018-11-03: 25 mL via INTRAVENOUS

## 2018-11-03 MED ORDER — PRO-STAT SUGAR FREE PO LIQD
30.0000 mL | Freq: Three times a day (TID) | ORAL | Status: DC
  Administered 2018-11-03 – 2018-11-05 (×3): 30 mL via ORAL
  Filled 2018-11-03 (×4): qty 30

## 2018-11-03 MED ORDER — FUROSEMIDE 10 MG/ML IJ SOLN
60.0000 mg | Freq: Once | INTRAMUSCULAR | Status: AC
  Administered 2018-11-03: 60 mg via INTRAVENOUS
  Filled 2018-11-03: qty 6

## 2018-11-03 MED ORDER — DEXTROSE 50 % IV SOLN
INTRAVENOUS | Status: AC
  Filled 2018-11-03: qty 50

## 2018-11-03 MED ORDER — GLUCERNA SHAKE PO LIQD
237.0000 mL | Freq: Three times a day (TID) | ORAL | Status: DC
  Administered 2018-11-03 (×2): 237 mL via ORAL
  Filled 2018-11-03 (×5): qty 237

## 2018-11-03 MED ORDER — INSULIN GLARGINE 100 UNIT/ML ~~LOC~~ SOLN
25.0000 [IU] | Freq: Every day | SUBCUTANEOUS | Status: DC
  Administered 2018-11-03 – 2018-11-05 (×3): 25 [IU] via SUBCUTANEOUS
  Filled 2018-11-03 (×4): qty 0.25

## 2018-11-03 NOTE — Progress Notes (Signed)
Occupational Therapy Evaluation Patient Details Name: Austin Page MRN: [id number] DOB: 03-08-1951 Today's Date: 11/03/2018    History of Present Illness 68 y.o. male admitted on 11-21-2018 with fever, cough, N/V and generalized weakness.  He was found to be COVID (+) with acute hypoxic respiratory failure, acute on chronic nonspecific CHF, hyponatremia, hypokalemia, anxiety, DM2 (newly dx).  Pt with no significant PMH on file.     Clinical Impression   Limited eval per nsg request. Pt having a difficult time maintaining SpO2 in 90s. Pt requires assistance with bed mobility due to weakness and poor activity tolerance. Pt will need therapy at SNF. Will follow acutely.     Follow Up Recommendations  SNF;Supervision/Assistance - 24 hour    Equipment Recommendations  3 in 1 bedside commode    Recommendations for Other Services       Precautions / Restrictions Precautions Precautions: Fall;Other (comment) Precaution Comments: monitor O2 sats, very tenuous.      Mobility Bed Mobility Overal bed mobility: Needs Assistance Bed Mobility: Rolling Rolling: Mod assist            Transfers                 General transfer comment: not attempted at this time per nsg request    Balance                                           ADL either performed or assessed with clinical judgement   ADL Overall ADL's : Needs assistance/impaired     Grooming: Set up;Sitting;Bed level   Upper Body Bathing: Minimal assistance;Bed level Upper Body Bathing Details (indicate cue type and reason): limited by endurance Lower Body Bathing: Maximal assistance;Bed level   Upper Body Dressing : Moderate assistance;Bed level   Lower Body Dressing: Maximal assistance;Bed level               Functional mobility during ADLs: (bed level due to SpO2) General ADL Comments: significantly limited by poor endurance; assisted nsg with positioning pt on bed pan     Vision          Perception     Praxis      Pertinent Vitals/Pain Pain Assessment: Faces Faces Pain Scale: Hurts a little bit Pain Location: general discomfort Pain Descriptors / Indicators: Discomfort Pain Intervention(s): Limited activity within patient's tolerance     Hand Dominance Right   Extremity/Trunk Assessment Upper Extremity Assessment Upper Extremity Assessment: Generalized weakness   Lower Extremity Assessment Lower Extremity Assessment: Defer to PT evaluation   Cervical / Trunk Assessment Cervical / Trunk Assessment: Normal   Communication Communication Communication: Prefers language other than English   Cognition Arousal/Alertness: Awake/alert Behavior During Therapy: WFL for tasks assessed/performed Overall Cognitive Status: Within Functional Limits for tasks assessed                                     General Comments       Exercises     Shoulder Instructions      Home Living Family/patient expects to be discharged to:: Private residence Living Arrangements: Spouse/significant other Available Help at Discharge: Family;Available 24 hours/day Type of Home: House Home Access: Level entry     Home Layout: One level     Bathroom Shower/Tub: Walk-in shower  Bathroom Toilet: Standard     Home Equipment: Cane - single point          Prior Functioning/Environment Level of Independence: Independent        Comments: does not drive, but 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         OT Problem List: Decreased strength;Decreased activity tolerance;Decreased knowledge of use of DME or AE;Cardiopulmonary status limiting activity;Pain      OT Treatment/Interventions: Self-care/ADL training;Therapeutic exercise;Neuromuscular education;Energy conservation;DME and/or AE instruction;Therapeutic activities;Patient/family education;Balance training    OT Goals(Current goals can be found in the care plan section) Acute Rehab OT  Goals Patient Stated Goal: to get better OT Goal Formulation: With patient Time For Goal Achievement: 11/17/18 Potential to Achieve Goals: Good  OT Frequency: Min 3X/week   Barriers to D/C:            Co-evaluation              AM-PAC OT "6 Clicks" Daily Activity     Outcome Measure Help from another person eating meals?: A Little Help from another person taking care of personal grooming?: A Little Help from another person toileting, which includes using toliet, bedpan, or urinal?: A Lot Help from another person bathing (including washing, rinsing, drying)?: A Lot Help from another person to put on and taking off regular upper body clothing?: A Lot Help from another person to put on and taking off regular lower body clothing?: A Lot 6 Click Score: 14   End of Session Equipment Utilized During Treatment: Oxygen  Activity Tolerance: Patient limited by fatigue Patient left: in bed;with call bell/phone within reach  OT Visit Diagnosis: Other abnormalities of gait and mobility (R26.89);Muscle weakness (generalized) (M62.81);Pain                Time: 1235-1250 OT Time Calculation (min): 15 min Charges:  OT General Charges $OT Visit: 1 Visit OT Evaluation $OT Eval Moderate Complexity: [place], OT/L   Acute OT Clinical Specialist Acute Rehabilitation Services Pager [phone] Office [phone]   [facility] 11/03/2018, 3:45 PM

## 2018-11-03 NOTE — Progress Notes (Signed)
PT Cancellation Note  Patient Details Name: HERNY SCURLOCK MRN: [id number] DOB: Oct 17, 1950   Cancelled Treatment:    Reason Eval/Treat Not Completed: Medical issues which prohibited therapy, requiring  Increased O2 requirements today, limited activity.   Claretha Cooper 11/03/2018, 4:51 PM [place] Pager [phone] Office [phone]

## 2018-11-03 NOTE — Progress Notes (Signed)
Updated daughter Kathrynn Ducking) over the phone.

## 2018-11-03 NOTE — Progress Notes (Signed)
Hypoglycemic Event  CBG: 70  Treatment: 1/2 amp D50 Symptoms: weakness  Follow-up CBG: Time 830 CBG Result: 201  Possible Reasons for Event: lack of po intake  Comments/MD notified: While in the room.  Requested 1/2 amp D50 be given.    Reilly Blades, Kathryne Hitch

## 2018-11-03 NOTE — Progress Notes (Signed)
PROGRESS NOTE                                                                                                                                                                                                             Patient Demographics:    Austin Page, is a 68 y.o. male, DOB - May 07, 1951, JYN:[id number]  Outpatient Primary MD for the patient is Patient, [name]    LOS - 23  Admit date - 09/21/2018    Chief Complaint  Patient presents with  . Cough       Brief Narrative  68 year old male with no significant past medical history came into the hospital and was admitted on 09/27/2018 with 3 days of fever, chills, cough, vomiting and weakness in the setting of having a normal Covid contact.  In the ER he was febrile, tachycardic, tachypneic, underwent a CT angiogram which showed peripheral groundglass opacities.  He was Covid positive himself.  He had rapid deterioration of respiratory status requiring 15 L high flow nasal cannula and was transferred to the ICU on 5/25, he was transferred out of ICU on 10/19/2018 and transferred to my care on the same date.   Subjective:   Patient in bed, appears comfortable, denies any headache, no fever, no chest pain or pressure, +ve shortness of breath , no abdominal pain. No focal weakness.   Assessment  & Plan :     1. Acute Hypoxic Resp. Failure due to Acute Covid 19 Viral Illness during the ongoing 2020 Covid 19 Pandemic - has received appropriate treatment which include IV steroids, 2 doses of Actemra along with convalescent plasma.    Treatment so far  - Remdesivir started on 5/24 - Received Actemra x 2 on 5/24 and 5/25 - Received convalescent plasma on 5/25 - Continue IV Solu-Medrol was increased again on 10/25/2018  Hypoxia seems to be quite positional however he has shown little signs of improvement, with pruning and sitting up improves temporarily, currently on 15 L high  flow nasal cannula oxygen.  He has been between high flow 30 L to 4 L in the last few days, has been adequately diuresed, on IV steroids which will be continued dose was adjusted on 10/25/2018.  Continue sitting up in chair in the daytime with I-S and flutter valve for pulmonary toiletry and prone in bed  at night.    Unfortunately he continues to remain extremely tenuous and hypoxic.  Prognosis is poor he is DNR.  Daughter was updated by me personally on 10/25/2018.  Continue supportive care as he has already maximized his treatment, no further intubation.  Continue high flow nasal cannula oxygen as needed.  Monitor clinically.  Prepare for LTAC discharge social worker has been consulted on 10/31/2018 and will consult again on 11/03/2018.  I have updated family multiple times if he declines any further full goal of care will be comfort.  He is DNR.   COVID-19 Labs  Recent Labs    11/01/18 0620 11/02/18 0330 11/03/18 0328  DDIMER 0.33 0.35 0.41  FERRITIN 208 177  --   LDH 230* 260* 289*  CRP <0.8 <0.8  --     Lab Results  Component Value Date   SARSCOV2NAA POSITIVE (A) 09/26/2018      Component Value Date/Time   BNP 62.9 11/03/2018 0328    Acute on chronic nonspecific CHF.  Could be mostly diastolic.  Does have bilateral rails on exam, no previous echo on file.  Diurese, low-dose beta-blocker and monitor.  Intermittent IV Lasix repeat 11/03/18, outpatient cardiology follow-up and echocardiogram.    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  Hypokalemia - replaced and stable   Rectal pain while having bowel movement.  Anusol cream.  Outpatient GI follow-up.  Resolved for now.  Mild anxiety.  Low-dose Klonopin.    Constipation with moderate stool burden noted on x-ray on 11/02/2018.  Started on aggressive bowel regimen, already having good results we will continue to monitor.    Newly Diagnosed DM2 - poor outpatient control due to hyperglycemia A1c of 10.7,  he has been placed on Lantus, sliding scale along with pre-meal NovoLog however his oral intake has been poor and now he is having episodes of hypoglycemia hence dose adjusted again on 11/03/2018.  Lab Results  Component Value Date   HGBA1C 10.7 (H) 10/11/2018   CBG (last 3)  Recent Labs    11/02/18 1121 11/02/18 2043 11/03/18 0731  GLUCAP 201* 107* 70      Condition - Extremely Guarded  Family Communication  : Daughter [name] by me over the phone on 10/25/2018, son il law Jaymin on 10/28/18, 10/29/18  Code Status :  DNR  Diet : Regular  Disposition Plan  :  LTAC   Consults  :  None  Procedures  :  None  PUD Prophylaxis : None  DVT Prophylaxis  :  Lovenox   Lab Results  Component Value Date   PLT 254 11/03/2018    Inpatient Medications  Scheduled Meds: . dextrose      . chlorhexidine  15 mL Mouth Rinse BID  . clonazepam  0.5 mg Oral BID  . docusate sodium  200 mg Oral BID  . enoxaparin (LOVENOX) injection  40 mg Subcutaneous Q12H  . feeding supplement (GLUCERNA SHAKE)  237 mL Oral BID WC  . hydrocortisone   Rectal TID  . insulin aspart  0-15 Units Subcutaneous TID WC  . insulin aspart  0-5 Units Subcutaneous QHS  . insulin aspart  6 Units Subcutaneous TID WC  . insulin glargine  30 Units Subcutaneous Daily  . insulin starter kit- syringes  1 kit Other Once  . isosorbide mononitrate  60 mg Oral Daily  . lactulose  20 g Oral BID  . living well with diabetes book   Does not apply Once  .  mouth rinse  15 mL Mouth Rinse q12n4p  . methylPREDNISolone (SOLU-MEDROL) injection  60 mg Intravenous Daily  . metoprolol tartrate  50 mg Oral BID  . milk and molasses  1 enema Rectal Once  . polyethylene glycol  17 g Oral BID   Continuous Infusions:  PRN Meds:.acetaminophen, albuterol, alum & mag hydroxide-simeth, guaiFENesin-dextromethorphan, lip balm, [DISCONTINUED] ondansetron **OR** ondansetron (ZOFRAN) IV, sodium chloride  Antibiotics  :    Anti-infectives (From  admission, onward)   Start     Dose/Rate Route Frequency Ordered Stop   10/24/18 2000  ceFEPIme (MAXIPIME) 2 g in sodium chloride 0.9 % 100 mL IVPB  Status:  Discontinued     2 g 200 mL/hr over 30 Minutes Intravenous Every 8 hours 10/24/18 1943 10/25/18 1222   10/13/18 1400  remdesivir 100 mg in sodium chloride 0.9 % 230 mL IVPB     100 mg over 30 Minutes Intravenous Every 24 hours 10/12/18 1113 10/16/18 1930   10/12/18 1400  remdesivir 200 mg in sodium chloride 0.9 % 210 mL IVPB     200 mg over 30 Minutes Intravenous Once 10/12/18 1113 10/12/18 1749       Time Spent in minutes  30   Lala Lund M.D on 11/03/2018 at 8:40 AM  To page go to [email or web address] - password [facility]  [facility] -  Office  [phone]   See all Orders from today for further details    Objective:   Vitals:   11/03/18 0700 11/03/18 0800 11/03/18 0832 11/03/18 0833  BP:      Pulse:      Resp:      Temp:      TempSrc:      SpO2: 94% 90% 97% 95%  Weight:      Height:        Wt Readings from Last 3 Encounters:  09/24/2018 72.6 kg     Intake/Output Summary (Last 24 hours) at 11/03/2018 0840 Last data filed at 11/03/2018 0600 Gross per 24 hour  Intake 300 ml  Output 825 ml  Net -525 ml     Physical Exam  Awake Alert, Oriented X 3, No new F.N deficits, Normal affect [place].AT,PERRAL Supple Neck,No JVD, No cervical lymphadenopathy appriciated.  Symmetrical Chest wall movement, Good air movement bilaterally, +ve rales RRR,No Gallops, Rubs or new Murmurs, No Parasternal Heave +ve B.Sounds, Abd Soft, No tenderness, No organomegaly appriciated, No rebound - guarding or rigidity. No Cyanosis, Clubbing or edema, No new Rash or bruise    Data Review:    CBC Recent Labs  Lab 10/29/18 0309 10/30/18 0845 10/31/18 0300 11/01/18 0620 11/02/18 0330 11/03/18 0328  WBC 15.3* 14.6* 14.9* 16.1* 15.6* 17.6*  HGB 16.0 16.4 15.2 15.7 16.4 16.6  HCT 47.7 49.6 46.4 47.5 49.6 52.0  PLT 324 296 278  257 236 254  MCV 91.0 90.5 91.0 91.7 93.2 94.2  MCH 30.5 29.9 29.8 30.3 30.8 30.1  MCHC 33.5 33.1 32.8 33.1 33.1 31.9  RDW 13.8 13.8 13.8 13.8 14.3 14.4  LYMPHSABS 1.1  --  1.2 1.4 1.1 1.8  MONOABS 0.7  --  0.6 0.9 0.7 1.1*  EOSABS 0.0  --  0.0 0.0 0.0 0.0  BASOSABS 0.0  --  0.0 0.0 0.0 0.0    Chemistries  Recent Labs  Lab 10/30/18 0845 10/31/18 0300 11/01/18 0620 11/02/18 0330 11/03/18 0328  NA 135 137 140 140 141  K 3.8 4.8 4.5 4.8 4.2  CL 92* 96* 96* 95* 95*  CO2 32 30 34* 33* 33*  GLUCOSE 137* 103* 72 100* 33*  BUN 44* 49* 50* 40* 40*  CREATININE 0.72 0.65 0.72 0.70 0.71  CALCIUM 8.8* 9.1 9.4 9.2 9.5  MG 2.5* 2.6* 2.7* 2.7* 2.8*  AST 32 _0 ALT 44 39 39 37 38  ALKPHOS 70 62 65 66 72  BILITOT 0.6 0.7 0.8 0.8 0.8   ------------------------------------------------------------------------------------------------------------------ No results for input(s): CHOL, HDL, LDLCALC, TRIG, CHOLHDL, LDLDIRECT in the last 72 hours.  Lab Results  Component Value Date   HGBA1C 10.7 (H) 10/11/2018   ------------------------------------------------------------------------------------------------------------------ No results for input(s): TSH, T4TOTAL, T3FREE, THYROIDAB in the last 72 hours.  Invalid input(s): FREET3  Cardiac Enzymes No results for input(s): CKMB, TROPONINI, MYOGLOBIN in the last 168 hours.  Invalid input(s): CK ------------------------------------------------------------------------------------------------------------------    Component Value Date/Time   BNP 62.9 11/03/2018 0328    Micro Results No results found for this or any previous visit (from the past 240 hour(s)).  Radiology Reports Dg Abd 1 View  Result Date: 11/02/2018 CLINICAL DATA:  Abdominal pain and constipation EXAM: ABDOMEN - 1 VIEW COMPARISON:  None. FINDINGS: There is a moderate amount of stool throughout the colon. The bowel gas pattern is nonobstructive. There is no acute  osseous abnormality. IMPRESSION: Moderate stool burden. Electronically Signed   By: Constance Holster M.D.   On: 11/02/2018 02:56   Ct Chest Wo Contrast  Result Date: 10/26/2018 CLINICAL DATA:  Acute respiratory illness, COVID-19 positive. EXAM: CT CHEST WITHOUT CONTRAST TECHNIQUE: Multidetector CT imaging of the chest was performed following the standard protocol without IV contrast. COMPARISON:  10/11/2018 FINDINGS: Cardiovascular: Heart is normal size. Calcified plaque over the left main, left anterior descending and lateral circumflex coronary arteries. Calcified plaque over the thoracic aorta. Remaining vascular structures are unremarkable. Mediastinum/Nodes: No significant mediastinal or hilar adenopathy. Remaining mediastinal structures are unremarkable. Lungs/Pleura: Lungs are adequately inflated demonstrate moderate interval worsening of a patchy bilateral airspace process demonstrating ground-glass and crazy paving features and is mid to lower lung predominant. No effusion. Airways unremarkable. Upper Abdomen: No acute findings. Calcified plaque over the abdominal aorta. Musculoskeletal: Mild degenerative change of the spine. IMPRESSION: Interval worsening of bilateral multifocal airspace process worse over the mid to lower lungs compatible with worsening known viral pneumonia. Aortic Atherosclerosis (ICD10-I70.0). Atherosclerotic coronary artery disease. Electronically Signed   By: Marin Olp M.D.   On: 10/26/2018 15:43   Ct Angio Chest Pe W And/or Wo Contrast  Result Date: 10/11/2018 CLINICAL DATA:  68 y/o  M; cough, fever, chills.  COVID-19 positive. EXAM: CT ANGIOGRAPHY CHEST WITH CONTRAST TECHNIQUE: Multidetector CT imaging of the chest was performed using the standard protocol during bolus administration of intravenous contrast. Multiplanar CT image reconstructions and MIPs were obtained to evaluate the vascular anatomy. CONTRAST:  12m OMNIPAQUE IOHEXOL 300 MG/ML  SOLN COMPARISON:   10/17/2018 chest radiograph FINDINGS: Cardiovascular: Mild respiratory motion artifact. Satisfactory opacification of the pulmonary arteries to the segmental level. No evidence of pulmonary embolism. Normal heart size. No pericardial effusion. Mild aortic and moderate coronary artery calcific atherosclerosis. Mediastinum/Nodes: No enlarged mediastinal, hilar, or axillary lymph nodes. Thyroid gland, trachea, and esophagus demonstrate no significant findings. Lungs/Pleura: Multiple peripheral ground-glass opacities with the largest confluent opacity in the right upper lobe. The right upper lobe demonstrates intra lobular septal thickening "crazy paving" pattern. No pleural effusion or pneumothorax. Upper Abdomen: No acute abnormality. Musculoskeletal: No chest wall abnormality. No acute or significant osseous findings. Review of the  MIP images confirms the above findings. IMPRESSION: 1. Mild respiratory motion artifact. No pulmonary embolus identified. 2. There are a spectrum of findings in the lungs which can be seen with acute atypical infection (as well as other non-infectious etiologies). In particular, viral pneumonia (including COVID-19) should be considered in the appropriate clinical setting. Critical Value/emergent results were called by telephone at the time of interpretation on 10/11/2018 at 3:08 am to PA [place] , who verbally acknowledged these results. Electronically Signed   By: Kristine Garbe M.D.   On: 10/11/2018 03:21   Dg Chest Port 1 View  Result Date: 10/30/2018 CLINICAL DATA:  COPD, positive COVID-19. EXAM: PORTABLE CHEST 1 VIEW COMPARISON:  10/24/2018 and 09/28/2018 as well as chest CT 10/26/2018 FINDINGS: Lungs are somewhat hypoinflated and demonstrate bilateral mixed interstitial airspace opacification which is mid to lower lung predominant and not significantly changed from the recent prior exam 10/24/2018. No effusion. Cardiomediastinal silhouette and remainder of the exam  is unchanged. IMPRESSION: Stable bilateral mixed interstitial airspace process likely due to viral pneumonia in this patient who is known COVID-19 positive. Electronically Signed   By: Marin Olp M.D.   On: 10/30/2018 08:41   Dg Chest Port 1 View  Result Date: 10/24/2018 CLINICAL DATA:  Shortness of breath.  COVID-19. EXAM: PORTABLE CHEST 1 VIEW COMPARISON:  Chest x-rays dated 10/16/2018, 10/12/2018 and 10/05/2018 FINDINGS: The bilateral pulmonary infiltrates have progressed. Heart size and vascularity are normal. No effusions. No significant bone abnormality. Aortic atherosclerosis. IMPRESSION: Progression of the bilateral hazy pulmonary infiltrates since the prior study of 05/20 Aortic Atherosclerosis (ICD10-I70.0). Electronically Signed   By: Lorriane Shire M.D.   On: 10/24/2018 08:46   Dg Chest Port 1 View  Result Date: 10/19/2018 CLINICAL DATA:  Shortness of breath EXAM: PORTABLE CHEST 1 VIEW COMPARISON:  10/16/2018 FINDINGS: Multifocal airspace opacities, improved in the upper lungs bilaterally, persistent at the lung bases. This appearance favors improving multifocal pneumonia. No pleural effusion or pneumothorax. The heart is normal in size. IMPRESSION: Improving multifocal pneumonia, as above. Electronically Signed   By: Julian Hy M.D.   On: 10/19/2018 10:21   Dg Chest Port 1 View  Result Date: 10/16/2018 CLINICAL DATA:  Dyspnea.COVID19 EXAM: PORTABLE CHEST 1 VIEW COMPARISON:  10/12/2018 FINDINGS: Heart size appears normal. No pleural effusion identified. Diffuse hazy opacities are noted in the left lung. Airspace consolidation within the right upper lobe and left base is improved from previous exam. IMPRESSION: 1. Persistent left lung hazy opacities compatible with inflammation/infection. 2. Improving consolidation within the right upper lobe and left base. Electronically Signed   By: Kerby Moors M.D.   On: 10/16/2018 09:15   Dg Chest Port 1 View  Result Date: 10/12/2018  CLINICAL DATA:  Weakness and fever, history of COVID-19 EXAM: PORTABLE CHEST 1 VIEW COMPARISON:  10/11/2018 FINDINGS: Cardiac shadow is stable. Patchy infiltrates are seen bilaterally particularly in the right upper lobe consistent with that seen on recent CT examination and consistent with the patient's given clinical history. No sizable effusion is seen. No bony abnormality is noted. Elevation the right hemidiaphragm is again noted. IMPRESSION: Patchy infiltrates predominately within the right upper lobe consistent with the given clinical history of COVID-19 positivity. Electronically Signed   By: Inez Catalina M.D.   On: 10/12/2018 03:59   Dg Chest Portable 1 View  Result Date: 09/22/2018 CLINICAL DATA:  Symptoms of possible COVID-19 EXAM: PORTABLE CHEST 1 VIEW COMPARISON:  None. FINDINGS: The heart size and mediastinal contours are  within normal limits. Both lungs are clear. The visualized skeletal structures are unremarkable. IMPRESSION: No active disease. Electronically Signed   By: Ulyses Jarred M.D.   On: 10/01/2018 22:55

## 2018-11-03 NOTE — Progress Notes (Signed)
CardioVascular Research Department and AHF Team  ReDS Research Project   Patient #: [id number]  ReDS Measurement  Right: 37 %  Left: low quality x 3

## 2018-11-04 LAB — LACTATE DEHYDROGENASE: LDH: 270 U/L — ABNORMAL HIGH (ref 98–192)

## 2018-11-04 LAB — GLUCOSE, CAPILLARY
Glucose-Capillary: 147 mg/dL — ABNORMAL HIGH (ref 70–99)
Glucose-Capillary: 171 mg/dL — ABNORMAL HIGH (ref 70–99)
Glucose-Capillary: 277 mg/dL — ABNORMAL HIGH (ref 70–99)
Glucose-Capillary: 242 mg/dL — ABNORMAL HIGH (ref 70–99)

## 2018-11-04 MED ORDER — ADULT MULTIVITAMIN W/MINERALS CH
1.0000 | ORAL_TABLET | Freq: Every day | ORAL | Status: DC
  Administered 2018-11-04 – 2018-11-07 (×4): 1 via ORAL
  Filled 2018-11-04 (×3): qty 1

## 2018-11-04 MED ORDER — ENSURE ENLIVE PO LIQD
237.0000 mL | Freq: Three times a day (TID) | ORAL | Status: DC
  Administered 2018-11-04 – 2018-11-07 (×7): 237 mL via ORAL

## 2018-11-04 MED ORDER — PHENOL 1.4 % MT LIQD
1.0000 | OROMUCOSAL | Status: DC | PRN
  Administered 2018-11-05: 1 via OROMUCOSAL
  Filled 2018-11-04: qty 177

## 2018-11-04 NOTE — Progress Notes (Signed)
OT Treatment Note  On entry to room, pt in brighter spirits and willing to participate with therapy. Mobilized to EOB and became SOB and then began coughing and had difficulty stopping his coughing. Desat into 70s on 10L, increased to 15L HFNC; 4 min to rebound to 90s then O2 turned down to 8L. Will attempt to progress next session with increasing HOB into sitting position and completing activities in that position as tolerated before progressing. Dicussed with nsg staff and he stated that pt receives Klonopin in the am and may do better with am treatment. Asked to request Robitussin prior to session to help control coughing. Will continue to follow.     11/04/18 1800  OT Visit Information  Last OT Received On 11/04/18  Assistance Needed +2  PT/OT/SLP Co-Evaluation/Treatment Yes  Reason for Co-Treatment For patient/therapist safety;To address functional/ADL transfers  OT goals addressed during session ADL's and self-care;Strengthening/ROM  History of Present Illness 68 y.o. male admitted on 09/20/2018 with fever, cough, N/V and generalized weakness.  He was found to be COVID (+) with acute hypoxic respiratory failure, acute on chronic nonspecific CHF, hyponatremia, hypokalemia, anxiety, DM2 (newly dx).  Pt with no significant PMH on file.    Precautions  Precautions Fall;Other (comment)  Precaution Comments monitor O2 sats, very tenuous. required 15  L HFNC and still into 70's.  Pain Assessment  Pain Assessment Faces  Faces Pain Scale 2  Pain Location general discomfort  Pain Descriptors / Indicators Discomfort  Pain Intervention(s) Limited activity within patient's tolerance  Cognition  Arousal/Alertness Awake/alert  Behavior During Therapy WFL for tasks assessed/performed  Overall Cognitive Status Within Functional Limits for tasks assessed  General Comments initially very cheerful, after sitting up and having such difficulty breathing, he really became more somnolent for some time  Upper  Extremity Assessment  Upper Extremity Assessment Generalized weakness  Lower Extremity Assessment  Lower Extremity Assessment Defer to PT evaluation  ADL  Overall ADL's  Needs assistance/impaired  Eating/Feeding Set up;Sitting;Bed level  Grooming Minimal assistance;Sitting;Bed level  Functional mobility during ADLs Maximal assistance  General ADL Comments Unable to participate further in ADL after bed mobility  Bed Mobility  Overal bed mobility Needs Assistance  Bed Mobility Rolling;Sit to Supine  Rolling Min assist  Supine to sit Mod assist  Sit to supine Mod assist  Balance  Overall balance assessment Needs assistance  Sitting-balance support Bilateral upper extremity supported;No upper extremity supported  Sitting balance-Leahy Scale Fair  Sitting balance - Comments sits leaning forward, labored breathing  Transfers  General transfer comment unable to attempt  Exercises  Exercises Other exercises  Other Exercises  Other Exercises gentle BUE ROM as tolerated  OT - End of Session  Equipment Utilized During Treatment Oxygen  Activity Tolerance Patient limited by fatigue  Patient left in bed;with call bell/phone within reach  Nurse Communication Mobility status;Other (comment) (status)  OT Assessment/Plan  OT Plan Discharge plan remains appropriate  OT Visit Diagnosis Other abnormalities of gait and mobility (R26.89);Muscle weakness (generalized) (M62.81);Pain  Pain - part of body  (general discomfort)  OT Frequency (ACUTE ONLY) Min 3X/week  Follow Up Recommendations SNF;Supervision/Assistance - 24 hour  OT Equipment 3 in 1 bedside commode  AM-PAC OT "6 Clicks" Daily Activity Outcome Measure (Version 2)  Help from another person eating meals? 3  Help from another person taking care of personal grooming? 3  Help from another person toileting, which includes using toliet, bedpan, or urinal? 2  Help from another person bathing (including  washing, rinsing, drying)? 2  Help  from another person to put on and taking off regular upper body clothing? 2  Help from another person to put on and taking off regular lower body clothing? 2  6 Click Score 14  OT Goal Progression  Progress towards OT goals Progressing toward goals  Acute Rehab OT Goals  Patient Stated Goal to get better  OT Goal Formulation With patient  Time For Goal Achievement 11/17/18  Potential to Achieve Goals Good  ADL Goals  Pt Will Perform Grooming with set-up;sitting  Pt Will Perform Upper Body Bathing with set-up;with supervision;sitting  Pt Will Perform Lower Body Bathing with min assist;sit to/from stand  Pt Will Transfer to Toilet with min guard assist;bedside commode  Pt Will Perform Toileting - Clothing Manipulation and hygiene with set-up;with supervision;sitting/lateral leans;sit to/from stand  Pt/caregiver will Perform Home Exercise Program Increased strength;With theraband;With written HEP provided;With Supervision  OT Time Calculation  OT Start Time (ACUTE ONLY) 1514  OT Stop Time (ACUTE ONLY) 1538  OT Time Calculation (min) 24 min  OT General Charges  $OT Visit 1 Visit  OT Treatments  $Self Care/Home Management  8-22 mins  Luisa DagoHilary Babbie Dondlinger, OT/L   Acute OT Clinical Specialist Acute Rehabilitation Services Pager [phone] Office [phone]

## 2018-11-04 NOTE — Progress Notes (Signed)
 Initial Nutrition Assessment   RD working remotely.   DOCUMENTATION CODES:   Not applicable  INTERVENTION:   Discussed diet liberalization with MD Candiss Norse and received verbal order to liberalize to Regular (Vegetarian) to promote po intake   Ensure Enlive po TID, each supplement provides 350 kcal and 20 grams of protein  Continue 30 ml Prostat TID, each supplement provides 100 kcals and 15 grams protein.  Add MVI with Minerals   Pt receiving Hormel Shake daily with Breakfast which provides 520 kcals and 22 g of protein and Magic cup BID with lunch and dinner, each supplement provides 290 kcal and 9 grams of protein, automatically on meal trays to optimize nutritional intake.    NUTRITION DIAGNOSIS:   Inadequate oral intake related to acute illness, poor appetite as evidenced by meal completion < 50%, meal completion < 25%, per patient/family report.  GOAL:   Patient will meet greater than or equal to 90% of their needs  MONITOR:   PO intake, Supplement acceptance, Weight trends, Labs  REASON FOR ASSESSMENT:   LOS    ASSESSMENT:   68 yo male admitted with no PMH admitted with respiratory distress in setting of COVID-19 illness, hyperglycemia with newly dx DM and hyponatremia. Pt speaks [place].  LOS 24 days Pt continues with severe hypoxia, requiring HFNC and desats with minimal activity. Pt down to 11L from 15 L. Noted poor prognosis per MD notes. Plan is for LTAC discharge.   Pt is vegetarian and options are very limited, especially on Heart Healthy Carb Modified Fluid Restricted Diet; pt receiving very little protein on meal trays aside from any supplements that are provided. Noted recorded po intake yesterday 0% of meals. Pt eating minimally since admission, recorded po of 100% on 6/10 was just 100% of a banana. Noted a few meals recorded as 75 or 90% but mostly eating 0-10%. Pt is drinking some Ensure; did drink 100% of Ensure last night  No wounds or pressure  injuries noted.   Pt is weak, having difficulty working with therapy due to oxygen requirements.   No weight since admission. Net negative 5 L since admission; noted multiple unmeasured urine occurrences as well.   Lab Results  Component Value Date   HGBA1C 10.7 (H) 10/11/2018   Labs: CBGs 70-275 Meds: ss novolog, lantus, lactulose, solumedrol   NUTRITION - FOCUSED PHYSICAL EXAM:  Unable to Assess  Diet Order:  Heart Healthy, Carb Modified, 1200 mL Fluid Restriction, Vegetarian   EDUCATION NEEDS:   Not appropriate for education at this time  Skin:  Skin Assessment: Reviewed RN Assessment  Last BM:  6/15  Height:   Ht Readings from Last 1 Encounters:  10-30-2018 5\' 7"  (1.702 m)    Weight:   Wt Readings from Last 1 Encounters:  Oct 30, 2018 72.6 kg    BMI:  Body mass index is 25.06 kg/m.  Estimated Nutritional Needs:   Kcal:  1850-2050 kcals  Protein:  95-105 g  Fluid:  >/= 1.8 L    Meigan Pates MS, RDN, LDN, CNSC [phone] Pager  [phone] Weekend/On-Call Pager

## 2018-11-04 NOTE — Progress Notes (Signed)
[facility] Research Department and AHF Team  ReDS Research Project   Patient #: [id number]  ReDS Measurement  Right: low quality x 3  Left: 26 %

## 2018-11-04 NOTE — Progress Notes (Signed)
Physical Therapy Treatment Patient Details Name: Austin Page MRN: [id number] DOB: [birth year] Today's Date: 11/04/2018    History of Present Illness 68 y.o. male admitted on 10/06/2018 with fever, cough, N/V and generalized weakness.  He was found to be COVID (+) with acute hypoxic respiratory failure, acute on chronic nonspecific CHF, hyponatremia, hypokalemia, anxiety, DM2 (newly dx).  Pt with no significant PMH on file.      PT Comments    The patient appeared to feel better, and was cheerful , lying on left side. Assisted to sitting. Patient began to show labored breathing with coughing-like breaths. Sat on bed edge ~ 1 minute then assisted back to sidelying.  At rest  VS: SaO2 94% on 8 L HFNC, sitting Increased to 10 L HFNC with sats dropping into 70's. Increased to 15 L HFNC and assisted back to sidelying  As patient was having much difficulty with breathing. Required ~ 5 minutes to recover to >90% SaO2 on 15 L HFNC. Able to decrease to 8 L. RN notified of patient's difficulty with mobility and desaturation.  patient is unable to tolerate activity at this time without desaturation and SOB.   continue PT efforts as patient able to tolerate.   Follow Up Recommendations  SNF     Equipment Recommendations  Rolling walker with 5" wheels    Recommendations for Other Services       Precautions / Restrictions Precautions Precaution Comments: monitor O2 sats, very tenuous. required 15  L HFNC and still into 70's.    Mobility  Bed Mobility Overal bed mobility: Needs Assistance Bed Mobility: Rolling;Sit to Supine Rolling: Mod assist;Min assist   Supine to sit: Mod assist Sit to supine: Mod assist;Max assist;+2 for physical assistance;+2 for safety/equipment   General bed mobility comments: multimodal cues for all mobility.  while siting, noted labored breathing, desatting down to 70's  with 15 L HFNC, assisted back to bed and left on left side  Transfers                     Ambulation/Gait                 Stairs             Wheelchair Mobility    Modified Rankin (Stroke Patients Only)       Balance Overall balance assessment: Needs assistance Sitting-balance support: Bilateral upper extremity supported;No upper extremity supported   Sitting balance - Comments: sits leaning forward, labored breathing                                    Cognition   Behavior During Therapy: WFL for tasks assessed/performed                                   General Comments: initially very cheerful, after sitting up and having such difficulty breathing, he really became more somnolent for some time      Exercises      General Comments        Pertinent Vitals/Pain Faces Pain Scale: Hurts a little bit Pain Location: general discomfort Pain Descriptors / Indicators: Discomfort Pain Intervention(s): Monitored during session    Home Living                      Prior Function  PT Goals (current goals can now be found in the care plan section) Progress towards PT goals: Not progressing toward goals - comment;Progressing toward goals    Frequency    Min 3X/week      PT Plan Discharge plan needs to be updated    Co-evaluation PT/OT/SLP Co-Evaluation/Treatment: Yes Reason for Co-Treatment: For patient/therapist safety;Complexity of the patient's impairments (multi-system involvement) PT goals addressed during session: Mobility/safety with mobility OT goals addressed during session: ADL's and self-care      AM-PAC PT "6 Clicks" Mobility   Outcome Measure  Help needed turning from your back to your side while in a flat bed without using bedrails?: A Lot Help needed moving from lying on your back to sitting on the side of a flat bed without using bedrails?: Total Help needed moving to and from a bed to a chair (including a wheelchair)?: Total Help needed standing up from a chair  using your arms (e.g., wheelchair or bedside chair)?: Total Help needed to walk in hospital room?: Total Help needed climbing 3-5 steps with a railing? : Total 6 Click Score: 7    End of Session Equipment Utilized During Treatment: Oxygen Activity Tolerance: Treatment limited secondary to medical complications (Comment) Patient left: in bed;with call bell/phone within reach Nurse Communication: Mobility status(desaturation) PT Visit Diagnosis: Muscle weakness (generalized) (M62.81);Difficulty in walking, not elsewhere classified (R26.2)     Time: [id number] PT Time Calculation (min) (ACUTE ONLY): 31 min  Charges:  $Therapeutic Activity: 8-22 mins                     [place]aren Meggan Dhaliwal PT [phone]   Rada HayHill, Jazion Atteberry Elizabeth 11/04/2018, 5:22 PM

## 2018-11-04 NOTE — Progress Notes (Signed)
PROGRESS NOTE                                                                                                                                                                                                             Patient Demographics:    Austin Page, is a 68 y.o. male, DOB - October 29, 1950, GMW:[id number]  Outpatient Primary MD for the patient is Patient, [name]    LOS - 24  Admit date - 10/19/2018    Chief Complaint  Patient presents with  . Cough       Brief Narrative  68 year old male with no significant past medical history came into the hospital and was admitted on 10/09/2018 with 3 days of fever, chills, cough, vomiting and weakness in the setting of having a normal Covid contact.  In the ER he was febrile, tachycardic, tachypneic, underwent a CT angiogram which showed peripheral groundglass opacities.  He was Covid positive himself.  He had rapid deterioration of respiratory status requiring 15 L high flow nasal cannula and was transferred to the ICU on 5/25, he was transferred out of ICU on 10/19/2018 and transferred to my care on the same date.   Subjective:   Patient in bed, appears comfortable, denies any headache, no fever, no chest pain or pressure, improved shortness of breath , no abdominal pain. No focal weakness.   Assessment  & Plan :     1. Acute Hypoxic Resp. Failure due to Acute Covid 19 Viral Illness during the ongoing 2020 Covid 19 Pandemic - has received appropriate treatment which include IV steroids, 2 doses of Actemra along with convalescent plasma.    Treatment so far  - Remdesivir started on 5/24 - Received Actemra x 2 on 5/24 and 5/25 - Received convalescent plasma on 5/25 - Continue IV Solu-Medrol was increased again on 10/25/2018  Continues to have severe hypoxia and still requiring high flow nasal cannula oxygen, desats with minimal activity, he has received maximal therapy but  unfortunately continues to remain extremely tenuous and hypoxic.  Prognosis is poor he is DNR.  Daughter was updated by me personally on 10/25/2018 and again on 11/01/2018.  Continue supportive care as he has already maximized his treatment, no further intubation.    Continue high flow nasal cannula oxygen, fortunately in the last 24 hours we are able to  titrate his high flow nasal cannula oxygen from 15 L to close to 11 L.  Continue supportive care with gradual improvement but still overall very tenuous condition.  Prepare for LTAC discharge social worker has been consulted on 10/31/2018 and will consult again on 11/03/2018.  I have updated family multiple times if he declines any further full goal of care will be comfort.  He is DNR.   COVID-19 Labs  Recent Labs    11/02/18 0330 11/03/18 0328 11/04/18 0223  DDIMER 0.35 0.41  --   FERRITIN 177 203  --   LDH 260* 289* 270*  CRP <0.8 <0.8  --     Lab Results  Component Value Date   SARSCOV2NAA POSITIVE (A) 10/14/2018      Component Value Date/Time   BNP 62.9 11/03/2018 0328    Acute on chronic nonspecific CHF.  Could be mostly diastolic.  Does have bilateral rails on exam, no previous echo on file.  Diurese, low-dose beta-blocker and monitor.  Intermittent IV Lasix repeat 11/03/18, outpatient cardiology follow-up and echocardiogram.  Re-address Lasix on a daily basis.  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  Hypokalemia - replaced and stable   Rectal pain while having bowel movement.  Anusol cream.  Outpatient GI follow-up.  Resolved for now.  Mild anxiety.  Low-dose Klonopin.    Constipation with moderate stool burden noted on x-ray on 11/02/2018.  Started on aggressive bowel regimen, already having good results we will continue to monitor.    Newly Diagnosed DM2 - poor outpatient control due to hyperglycemia A1c of 10.7, he has been placed on Lantus, sliding scale along with  pre-meal NovoLog however his oral intake has been poor and now he is having episodes of hypoglycemia hence dose adjusted again on 11/03/2018.  Lab Results  Component Value Date   HGBA1C 10.7 (H) 10/11/2018   CBG (last 3)  Recent Labs    11/03/18 1646 11/03/18 2114 11/04/18 0738  GLUCAP 275* 122* 147*      Condition - Extremely Guarded  Family Communication  : Daughter [name] by me over the phone on 10/25/2018, son il law Jaymin on 10/28/18, 10/29/18, 11/01/18  Code Status :  DNR  Diet : Regular  Disposition Plan  :  LTAC   Consults  :  None  Procedures  :  None  PUD Prophylaxis : None  DVT Prophylaxis  :  Lovenox   Lab Results  Component Value Date   PLT 254 11/03/2018    Inpatient Medications  Scheduled Meds: . chlorhexidine  15 mL Mouth Rinse BID  . clonazepam  0.5 mg Oral BID  . docusate sodium  200 mg Oral BID  . enoxaparin (LOVENOX) injection  40 mg Subcutaneous Q12H  . feeding supplement (GLUCERNA SHAKE)  237 mL Oral TID WC  . feeding supplement (PRO-STAT SUGAR FREE 64)  30 mL Oral TID WC  . insulin aspart  0-15 Units Subcutaneous TID WC  . insulin aspart  0-5 Units Subcutaneous QHS  . insulin glargine  25 Units Subcutaneous Daily  . insulin starter kit- syringes  1 kit Other Once  . isosorbide mononitrate  60 mg Oral Daily  . lactulose  20 g Oral Daily  . living well with diabetes book   Does not apply Once  . mouth rinse  15 mL Mouth Rinse q12n4p  . methylPREDNISolone (SOLU-MEDROL) injection  60 mg Intravenous Daily  . metoprolol tartrate  50 mg Oral BID  .  polyethylene glycol  17 g Oral BID   Continuous Infusions:  PRN Meds:.acetaminophen, albuterol, alum & mag hydroxide-simeth, guaiFENesin-dextromethorphan, lip balm, [DISCONTINUED] ondansetron **OR** ondansetron (ZOFRAN) IV, phenol, sodium chloride  Antibiotics  :    Anti-infectives (From admission, onward)   Start     Dose/Rate Route Frequency Ordered Stop   10/24/18 2000  ceFEPIme (MAXIPIME) 2  g in sodium chloride 0.9 % 100 mL IVPB  Status:  Discontinued     2 g 200 mL/hr over 30 Minutes Intravenous Every 8 hours 10/24/18 1943 10/25/18 1222   10/13/18 1400  remdesivir 100 mg in sodium chloride 0.9 % 230 mL IVPB     100 mg over 30 Minutes Intravenous Every 24 hours 10/12/18 1113 10/16/18 1930   10/12/18 1400  remdesivir 200 mg in sodium chloride 0.9 % 210 mL IVPB     200 mg over 30 Minutes Intravenous Once 10/12/18 1113 10/12/18 1749       Time Spent in minutes  30   Lala Lund M.D on 11/04/2018 at 9:41 AM  To page go to [email or web address] - password [facility]  [facility] -  Office  [phone]   See all Orders from today for further details    Objective:   Vitals:   11/04/18 0700 11/04/18 0715 11/04/18 0800 11/04/18 0844  BP:  127/80    Pulse: (!) 116 (!) 117    Resp:  (!) 39 (!) 26   Temp:  (!) 94.4 F (34.7 C)    TempSrc:  Axillary    SpO2: (!) 74% 90%  98%  Weight:      Height:        Wt Readings from Last 3 Encounters:  09/23/2018 72.6 kg     Intake/Output Summary (Last 24 hours) at 11/04/2018 0941 Last data filed at 11/04/2018 0300 Gross per 24 hour  Intake 880 ml  Output 500 ml  Net 380 ml     Physical Exam  Awake Alert, Oriented X 3, No new F.N deficits, he is extremely weak and frail, [place].AT,PERRAL Supple Neck,No JVD, No cervical lymphadenopathy appriciated.  Symmetrical Chest wall movement, Good air movement bilaterally, CTAB RRR,No Gallops, Rubs or new Murmurs, No Parasternal Heave +ve B.Sounds, Abd Soft, No tenderness, No organomegaly appriciated, No rebound - guarding or rigidity. No Cyanosis, Clubbing or edema, No new Rash or bruise   Data Review:    CBC Recent Labs  Lab 10/29/18 0309 10/30/18 0845 10/31/18 0300 11/01/18 0620 11/02/18 0330 11/03/18 0328  WBC 15.3* 14.6* 14.9* 16.1* 15.6* 17.6*  HGB 16.0 16.4 15.2 15.7 16.4 16.6  HCT 47.7 49.6 46.4 47.5 49.6 52.0  PLT 324 296 278 257 236 254  MCV 91.0 90.5 91.0 91.7  93.2 94.2  MCH 30.5 29.9 29.8 30.3 30.8 30.1  MCHC 33.5 33.1 32.8 33.1 33.1 31.9  RDW 13.8 13.8 13.8 13.8 14.3 14.4  LYMPHSABS 1.1  --  1.2 1.4 1.1 1.8  MONOABS 0.7  --  0.6 0.9 0.7 1.1*  EOSABS 0.0  --  0.0 0.0 0.0 0.0  BASOSABS 0.0  --  0.0 0.0 0.0 0.0    Chemistries  Recent Labs  Lab 10/30/18 0845 10/31/18 0300 11/01/18 0620 11/02/18 0330 11/03/18 0328  NA 135 137 140 140 141  K 3.8 4.8 4.5 4.8 4.2  CL 92* 96* 96* 95* 95*  CO2 32 30 34* 33* 33*  GLUCOSE 137* 103* 72 100* 33*  BUN 44* 49* 50* 40* 40*  CREATININE 0.72 0.65 0.72 0.70 0.71  CALCIUM 8.8* 9.1 9.4 9.2 9.5  MG 2.5* 2.6* 2.7* 2.7* 2.8*  AST 32 _0 ALT 44 39 39 37 38  ALKPHOS 70 62 65 66 72  BILITOT 0.6 0.7 0.8 0.8 0.8   ------------------------------------------------------------------------------------------------------------------ No results for input(s): CHOL, HDL, LDLCALC, TRIG, CHOLHDL, LDLDIRECT in the last 72 hours.  Lab Results  Component Value Date   HGBA1C 10.7 (H) 10/11/2018   ------------------------------------------------------------------------------------------------------------------ No results for input(s): TSH, T4TOTAL, T3FREE, THYROIDAB in the last 72 hours.  Invalid input(s): FREET3  Cardiac Enzymes No results for input(s): CKMB, TROPONINI, MYOGLOBIN in the last 168 hours.  Invalid input(s): CK ------------------------------------------------------------------------------------------------------------------    Component Value Date/Time   BNP 62.9 11/03/2018 0328    Micro Results No results found for this or any previous visit (from the past 240 hour(s)).  Radiology Reports Dg Abd 1 View  Result Date: 11/02/2018 CLINICAL DATA:  Abdominal pain and constipation EXAM: ABDOMEN - 1 VIEW COMPARISON:  None. FINDINGS: There is a moderate amount of stool throughout the colon. The bowel gas pattern is nonobstructive. There is no acute osseous abnormality. IMPRESSION: Moderate  stool burden. Electronically Signed   By: Constance Holster M.D.   On: 11/02/2018 02:56   Ct Chest Wo Contrast  Result Date: 10/26/2018 CLINICAL DATA:  Acute respiratory illness, COVID-19 positive. EXAM: CT CHEST WITHOUT CONTRAST TECHNIQUE: Multidetector CT imaging of the chest was performed following the standard protocol without IV contrast. COMPARISON:  10/11/2018 FINDINGS: Cardiovascular: Heart is normal size. Calcified plaque over the left main, left anterior descending and lateral circumflex coronary arteries. Calcified plaque over the thoracic aorta. Remaining vascular structures are unremarkable. Mediastinum/Nodes: No significant mediastinal or hilar adenopathy. Remaining mediastinal structures are unremarkable. Lungs/Pleura: Lungs are adequately inflated demonstrate moderate interval worsening of a patchy bilateral airspace process demonstrating ground-glass and crazy paving features and is mid to lower lung predominant. No effusion. Airways unremarkable. Upper Abdomen: No acute findings. Calcified plaque over the abdominal aorta. Musculoskeletal: Mild degenerative change of the spine. IMPRESSION: Interval worsening of bilateral multifocal airspace process worse over the mid to lower lungs compatible with worsening known viral pneumonia. Aortic Atherosclerosis (ICD10-I70.0). Atherosclerotic coronary artery disease. Electronically Signed   By: Marin Olp M.D.   On: 10/26/2018 15:43   Ct Angio Chest Pe W And/or Wo Contrast  Result Date: 10/11/2018 CLINICAL DATA:  68 y/o  M; cough, fever, chills.  COVID-19 positive. EXAM: CT ANGIOGRAPHY CHEST WITH CONTRAST TECHNIQUE: Multidetector CT imaging of the chest was performed using the standard protocol during bolus administration of intravenous contrast. Multiplanar CT image reconstructions and MIPs were obtained to evaluate the vascular anatomy. CONTRAST:  13m OMNIPAQUE IOHEXOL 300 MG/ML  SOLN COMPARISON:  09/21/2018 chest radiograph FINDINGS:  Cardiovascular: Mild respiratory motion artifact. Satisfactory opacification of the pulmonary arteries to the segmental level. No evidence of pulmonary embolism. Normal heart size. No pericardial effusion. Mild aortic and moderate coronary artery calcific atherosclerosis. Mediastinum/Nodes: No enlarged mediastinal, hilar, or axillary lymph nodes. Thyroid gland, trachea, and esophagus demonstrate no significant findings. Lungs/Pleura: Multiple peripheral ground-glass opacities with the largest confluent opacity in the right upper lobe. The right upper lobe demonstrates intra lobular septal thickening "crazy paving" pattern. No pleural effusion or pneumothorax. Upper Abdomen: No acute abnormality. Musculoskeletal: No chest wall abnormality. No acute or significant osseous findings. Review of the MIP images confirms the above findings. IMPRESSION: 1. Mild respiratory motion artifact. No pulmonary embolus identified. 2. There are a spectrum of findings in the lungs which can  be seen with acute atypical infection (as well as other non-infectious etiologies). In particular, viral pneumonia (including COVID-19) should be considered in the appropriate clinical setting. Critical Value/emergent results were called by telephone at the time of interpretation on 10/11/2018 at 3:08 am to PA [place] , who verbally acknowledged these results. Electronically Signed   By: Kristine Garbe M.D.   On: 10/11/2018 03:21   Dg Chest Port 1 View  Result Date: 10/30/2018 CLINICAL DATA:  COPD, positive COVID-19. EXAM: PORTABLE CHEST 1 VIEW COMPARISON:  10/24/2018 and 09/26/2018 as well as chest CT 10/26/2018 FINDINGS: Lungs are somewhat hypoinflated and demonstrate bilateral mixed interstitial airspace opacification which is mid to lower lung predominant and not significantly changed from the recent prior exam 10/24/2018. No effusion. Cardiomediastinal silhouette and remainder of the exam is unchanged. IMPRESSION: Stable  bilateral mixed interstitial airspace process likely due to viral pneumonia in this patient who is known COVID-19 positive. Electronically Signed   By: Marin Olp M.D.   On: 10/30/2018 08:41   Dg Chest Port 1 View  Result Date: 10/24/2018 CLINICAL DATA:  Shortness of breath.  COVID-19. EXAM: PORTABLE CHEST 1 VIEW COMPARISON:  Chest x-rays dated 10/16/2018, 10/12/2018 and 09/24/2018 FINDINGS: The bilateral pulmonary infiltrates have progressed. Heart size and vascularity are normal. No effusions. No significant bone abnormality. Aortic atherosclerosis. IMPRESSION: Progression of the bilateral hazy pulmonary infiltrates since the prior study of 05/20 Aortic Atherosclerosis (ICD10-I70.0). Electronically Signed   By: Lorriane Shire M.D.   On: 10/24/2018 08:46   Dg Chest Port 1 View  Result Date: 10/19/2018 CLINICAL DATA:  Shortness of breath EXAM: PORTABLE CHEST 1 VIEW COMPARISON:  10/16/2018 FINDINGS: Multifocal airspace opacities, improved in the upper lungs bilaterally, persistent at the lung bases. This appearance favors improving multifocal pneumonia. No pleural effusion or pneumothorax. The heart is normal in size. IMPRESSION: Improving multifocal pneumonia, as above. Electronically Signed   By: Julian Hy M.D.   On: 10/19/2018 10:21   Dg Chest Port 1 View  Result Date: 10/16/2018 CLINICAL DATA:  Dyspnea.COVID19 EXAM: PORTABLE CHEST 1 VIEW COMPARISON:  10/12/2018 FINDINGS: Heart size appears normal. No pleural effusion identified. Diffuse hazy opacities are noted in the left lung. Airspace consolidation within the right upper lobe and left base is improved from previous exam. IMPRESSION: 1. Persistent left lung hazy opacities compatible with inflammation/infection. 2. Improving consolidation within the right upper lobe and left base. Electronically Signed   By: Kerby Moors M.D.   On: 10/16/2018 09:15   Dg Chest Port 1 View  Result Date: 10/12/2018 CLINICAL DATA:  Weakness and fever,  history of COVID-19 EXAM: PORTABLE CHEST 1 VIEW COMPARISON:  10/11/2018 FINDINGS: Cardiac shadow is stable. Patchy infiltrates are seen bilaterally particularly in the right upper lobe consistent with that seen on recent CT examination and consistent with the patient's given clinical history. No sizable effusion is seen. No bony abnormality is noted. Elevation the right hemidiaphragm is again noted. IMPRESSION: Patchy infiltrates predominately within the right upper lobe consistent with the given clinical history of COVID-19 positivity. Electronically Signed   By: Inez Catalina M.D.   On: 10/12/2018 03:59   Dg Chest Portable 1 View  Result Date: 10/16/2018 CLINICAL DATA:  Symptoms of possible COVID-19 EXAM: PORTABLE CHEST 1 VIEW COMPARISON:  None. FINDINGS: The heart size and mediastinal contours are within normal limits. Both lungs are clear. The visualized skeletal structures are unremarkable. IMPRESSION: No active disease. Electronically Signed   By: Cletus Gash.D.  On: 10/06/2018 22:55

## 2018-11-04 NOTE — Progress Notes (Signed)
Attempted to get patient up this morning.  Patient was able to get up to bed side commode, but did get hypoxic.  Patient was maxxed out on high flood nasal cannula, but wasn't able to get above 80 %, remained in the 70's.  Non-rebreather was started and was able to get patient above 90 percent.  Patient did not tolerate getting up very well, and almost coughed the whole time he was up.  Patient did not want to get into chair after that and required max assist to get back into bed.  After patient got back into bed, he did much better and was able to be weaned back to 10 liters with high 90's oxygen saturation.

## 2018-11-05 DIAGNOSIS — F05 Delirium due to known physiological condition: Secondary | ICD-10-CM

## 2018-11-05 LAB — COMPREHENSIVE METABOLIC PANEL
ALT: 42 U/L (ref 0–44)
AST: 25 U/L (ref 15–41)
Albumin: 3.3 g/dL — ABNORMAL LOW (ref 3.5–5.0)
Alkaline Phosphatase: 70 U/L (ref 38–126)
Anion gap: 14 (ref 5–15)
BUN: 43 mg/dL — ABNORMAL HIGH (ref 8–23)
CO2: 36 mmol/L — ABNORMAL HIGH (ref 22–32)
Calcium: 10.3 mg/dL (ref 8.9–10.3)
Chloride: 95 mmol/L — ABNORMAL LOW (ref 98–111)
Creatinine, Ser: 0.71 mg/dL (ref 0.61–1.24)
GFR calc Af Amer: >60 mL/min (ref >60)
GFR calc non Af Amer: >60 mL/min (ref >60)
Glucose, Bld: 164 mg/dL — ABNORMAL HIGH (ref 70–99)
Potassium: 3.9 mmol/L (ref 3.5–5.1)
Sodium: 145 mmol/L (ref 135–145)
Total Bilirubin: 0.8 mg/dL (ref 0.3–1.2)
Total Protein: 6.1 g/dL — ABNORMAL LOW (ref 6.5–8.1)

## 2018-11-05 LAB — CBC WITH DIFFERENTIAL/PLATELET
Abs Immature Granulocytes: 0.07 10*3/uL (ref 0.00–0.07)
Basophils Absolute: 0 10*3/uL (ref 0.0–0.1)
Basophils Relative: 0 %
Eosinophils Absolute: 0 10*3/uL (ref 0.0–0.5)
Eosinophils Relative: 0 %
HCT: 52.8 % — ABNORMAL HIGH (ref 39.0–52.0)
Hemoglobin: 16.6 g/dL (ref 13.0–17.0)
Immature Granulocytes: 1 %
Lymphocytes Relative: 10 %
Lymphs Abs: 1.2 10*3/uL (ref 0.7–4.0)
MCH: 29.8 pg (ref 26.0–34.0)
MCHC: 31.4 g/dL (ref 30.0–36.0)
MCV: 94.8 fL (ref 80.0–100.0)
Monocytes Absolute: 0.7 10*3/uL (ref 0.1–1.0)
Monocytes Relative: 6 %
Neutro Abs: 10.7 10*3/uL — ABNORMAL HIGH (ref 1.7–7.7)
Neutrophils Relative %: 83 %
Platelets: 211 10*3/uL (ref 150–400)
RBC: 5.57 MIL/uL (ref 4.22–5.81)
RDW: 14.6 % (ref 11.5–15.5)
WBC: 12.8 10*3/uL — ABNORMAL HIGH (ref 4.0–10.5)
nRBC: 0 % (ref 0.0–0.2)

## 2018-11-05 LAB — C-REACTIVE PROTEIN: CRP: <0.8 mg/dL (ref <1.0)

## 2018-11-05 LAB — MAGNESIUM
Magnesium: 2.7 mg/dL — ABNORMAL HIGH (ref 1.7–2.4)
Magnesium: 2.6 mg/dL — ABNORMAL HIGH (ref 1.7–2.4)

## 2018-11-05 LAB — GLUCOSE, CAPILLARY
Glucose-Capillary: 258 mg/dL — ABNORMAL HIGH (ref 70–99)
Glucose-Capillary: 146 mg/dL — ABNORMAL HIGH (ref 70–99)
Glucose-Capillary: 127 mg/dL — ABNORMAL HIGH (ref 70–99)

## 2018-11-05 LAB — BRAIN NATRIURETIC PEPTIDE: B Natriuretic Peptide: 79.7 pg/mL (ref 0.0–100.0)

## 2018-11-05 LAB — PHOSPHORUS: Phosphorus: 5 mg/dL — ABNORMAL HIGH (ref 2.5–4.6)

## 2018-11-05 LAB — FERRITIN: Ferritin: 194 ng/mL (ref 24–336)

## 2018-11-05 MED ORDER — VITAL HIGH PROTEIN PO LIQD: 1000.0000 mL | ORAL | Status: DC

## 2018-11-05 MED ORDER — OSMOLITE 1.5 CAL PO LIQD
1000.0000 mL | ORAL | Status: DC
  Administered 2018-11-05 – 2018-11-06 (×2): 1000 mL
  Filled 2018-11-05 (×3): qty 1000

## 2018-11-05 MED ORDER — METHYLPREDNISOLONE SODIUM SUCC 40 MG IJ SOLR
30.0000 mg | Freq: Every day | INTRAMUSCULAR | Status: DC
  Administered 2018-11-05: 30 mg via INTRAVENOUS
  Filled 2018-11-05: qty 1

## 2018-11-05 MED ORDER — HYDROCOD POLST-CPM POLST ER 10-8 MG/5ML PO SUER
5.0000 mL | Freq: Once | ORAL | Status: AC
  Administered 2018-11-05: 5 mL via ORAL
  Filled 2018-11-05: qty 5

## 2018-11-05 MED ORDER — SODIUM CHLORIDE 0.9 % IV SOLN
INTRAVENOUS | Status: DC
  Administered 2018-11-05: 12:00:00 via INTRAVENOUS

## 2018-11-05 MED ORDER — PRO-STAT SUGAR FREE PO LIQD
30.0000 mL | Freq: Three times a day (TID) | ORAL | Status: DC
  Administered 2018-11-05 – 2018-11-07 (×6): 30 mL
  Filled 2018-11-05 (×7): qty 30

## 2018-11-05 MED ORDER — FREE WATER
200.0000 mL | Freq: Three times a day (TID) | Status: DC
  Administered 2018-11-05 – 2018-11-06 (×3): 200 mL

## 2018-11-05 NOTE — Progress Notes (Signed)
[facility] PROGRESS NOTE    Progress Note  Austin Page  FKC:[id number] DOB: 1950-12-20 DOA: 09/22/2018 PCP: Patient, [name]     Brief Narrative:   Austin Page is an 68 y.o. male with no significant past medical history admitted on 09/19/2018 for 3 days of fever chills cough and vomiting, with a positive SARS-CoV-2 PCR positive chest external groundglass opacity he rated requiring 15 L of high flow nasal cannula was transferred to the ICU on 10/13/2018 completed his course of IV steroids, IV Remdesivir and 2 doses of Actemra.  Out of the ICU on 10/19/2018  Assessment/Plan:   Acute respiratory distress syndrome (ARDS) due to COVID-19 virus/ Acute respiratory disease due to COVID-19 virus: Actemra 2 doses 10/12/2018 and 10/13/2018 Convalescent plasma on 10/13/2018 IV Remdesivir 5/24/202020 5 days Currently on IV Solu-Medrol will start to taper off. Continues to havehypoxia requiring high flow nasal cannula.  He has received maximal therapy and his condition remains tenuous, still requires oxygen, have decreased over the last 24 hours from 10 L on high flow nasal cannula to 6. Has a poor prognosis and has been made to DNR/DNI Possible discharged on LTAC vs SNF  Acute on chronic nonspecific diastolic heart failure: Continue strict I's and O's Daily weights. Currently not on a beta-blocker, she appears to be dry. Gentle IV fluids for 24 hrs.  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 Now resolved.  Hypokalemia Replete orally now improved.  Constipation noted on x-ray on 11/02/2018: Started on aggressive bowel regimen now resolved.  Newly diagnosed diabetes mellitus type 2: A1c of 10.7, he has been planning on long-acting insulin with sliding scale. With fairly controlled blood glucose. Probably be a good candidate for oral metformin.  Acute confusional state: Patient relates he is in hotel he is not oriented to time and place  Severe protein caloric malnutrition:  Malnutrition Type: Poor Oral intake, will place a cortrak and start nutritional feedings.  Nutrition Problem: Inadequate oral intake Etiology: acute illness, poor appetite   Malnutrition Characteristics:  Signs/Symptoms: meal completion < 50%, meal completion < 25%, per patient/family report   Nutrition Interventions:  Interventions: Ensure Enlive (each supplement provides 350kcal and 20 grams of protein), MVI, Liberalize Diet, Prostat    DVT prophylaxis: lovenox Family Communication:[place] Disposition Plan/Barrier to D/C: SNF VS LTAC Code Status:     Code Status Orders  (From admission, onward)         Start     Ordered   10/25/18 1057  Do not attempt resuscitation (DNR)  Continuous    Question Answer Comment  In the event of cardiac or respiratory ARREST Do not call a "code blue"   In the event of cardiac or respiratory ARREST Do not perform Intubation, CPR, defibrillation or ACLS   In the event of cardiac or respiratory ARREST Use medication by any route, position, wound care, and other measures to relive pain and suffering. May use oxygen, suction and manual treatment of airway obstruction as needed for comfort.      10/25/18 1056        Code Status History    Date Active Date Inactive Code Status Order ID Comments User Context   10/11/2018 0517 10/25/2018 1056 Full Code [id number]  [name], MD ED   Advance Care Planning Activity        IV Access:    Peripheral IV   Procedures and diagnostic studies:   No results found.   Medical Consultants:    None.  Anti-Infectives:   none  Subjective:    Austin Page he relates he is in a hotel. No further complains  Objective:    Vitals:   11/05/18 0617 11/05/18 0647 11/05/18 0648 11/05/18 0700  BP:  122/81    Pulse: (!) 138 (!) 120 76 (!) 118  Resp:      Temp:      TempSrc:      SpO2: 92% (!) 88% 100% 100%  Weight:      Height:        Intake/Output Summary (Last 24 hours)  at 11/05/2018 0752 Last data filed at 11/04/2018 1200 Gross per 24 hour  Intake 500 ml  Output -  Net 500 ml   Filed Weights   10/11/2018 2035 11/04/18 1202  Weight: 72.6 kg 58.4 kg    Exam: General exam: In no acute distress. Respiratory system: Good air movement and clear to auscultation. Cardiovascular system: S1 & S2 heard, RRR.  Gastrointestinal system: Abdomen is nondistended, soft and nontender.  Central nervous system: Alert and oriented. No focal neurological deficits. Extremities: No pedal edema. Skin: No rashes, lesions or ulcers Psychiatry: Judgement and insight appear normal. Mood & affect appropriate.    Data Reviewed:    Labs: Basic Metabolic Panel: Recent Labs  Lab 10/31/18 0300 11/01/18 0620 11/02/18 0330 11/03/18 0328 11/05/18 0252  NA 137 140 140 141 145  K 4.8 4.5 4.8 4.2 3.9  CL 96* 96* 95* 95* 95*  CO2 30 34* 33* 33* 36*  GLUCOSE 103* 72 100* 33* 164*  BUN 49* 50* 40* 40* 43*  CREATININE 0.65 0.72 0.70 0.71 0.71  CALCIUM 9.1 9.4 9.2 9.5 10.3  MG 2.6* 2.7* 2.7* 2.8* 2.7*   GFR Estimated Creatinine Clearance: 74 mL/min (by C-G formula based on SCr of 0.71 mg/dL). Liver Function Tests: Recent Labs  Lab 10/31/18 0300 11/01/18 0620 11/02/18 0330 11/03/18 0328 11/05/18 0252  AST _0 ALT 39 39 37 38 42  ALKPHOS 62 65 66 72 70  BILITOT 0.7 0.8 0.8 0.8 0.8  PROT 5.5* 6.0* 6.0* 6.2* 6.1*  ALBUMIN 2.9* 3.4* 3.2* 3.4* 3.3*   No results for input(s): LIPASE, AMYLASE in the last 168 hours. No results for input(s): AMMONIA in the last 168 hours. Coagulation profile No results for input(s): INR, PROTIME in the last 168 hours. COVID-19 Labs  Recent Labs    11/03/18 0328 11/04/18 0223 11/05/18 0252  DDIMER 0.41  --   --   FERRITIN 203  --  194  LDH 289* 270*  --   CRP <0.8  --  <0.8    Lab Results  Component Value Date   SARSCOV2NAA POSITIVE (A) 10/15/2018    CBC: Recent Labs  Lab 10/31/18 0300 11/01/18 0620 11/02/18  0330 11/03/18 0328 11/05/18 0252  WBC 14.9* 16.1* 15.6* 17.6* 12.8*  NEUTROABS 13.0* 13.7* 13.7* 14.6* 10.7*  HGB 15.2 15.7 16.4 16.6 16.6  HCT 46.4 47.5 49.6 52.0 52.8*  MCV 91.0 91.7 93.2 94.2 94.8  PLT 278 257 236 254 211   Cardiac Enzymes: No results for input(s): CKTOTAL, CKMB, CKMBINDEX, TROPONINI in the last 168 hours. BNP (last 3 results) No results for input(s): PROBNP in the last 8760 hours. CBG: Recent Labs  Lab 11/03/18 2114 11/04/18 0738 11/04/18 1209 11/04/18 1701 11/04/18 2120  GLUCAP 122* 147* 171* 277* 242*   D-Dimer: Recent Labs    11/03/18 0328  DDIMER 0.41   Hgb A1c: No results for input(s):  HGBA1C in the last 72 hours. Lipid Profile: No results for input(s): CHOL, HDL, LDLCALC, TRIG, CHOLHDL, LDLDIRECT in the last 72 hours. Thyroid function studies: No results for input(s): TSH, T4TOTAL, T3FREE, THYROIDAB in the last 72 hours.  Invalid input(s): FREET3 Anemia work up: Recent Labs    11/03/18 0328 11/05/18 0252  FERRITIN 203 194   Sepsis Labs: Recent Labs  Lab 11/01/18 0620 11/02/18 0330 11/03/18 0328 11/05/18 0252  WBC 16.1* 15.6* 17.6* 12.8*   Microbiology No results found for this or any previous visit (from the past 240 hour(s)).   Medications:   . chlorhexidine  15 mL Mouth Rinse BID  . clonazepam  0.5 mg Oral BID  . docusate sodium  200 mg Oral BID  . enoxaparin (LOVENOX) injection  40 mg Subcutaneous Q12H  . feeding supplement (ENSURE ENLIVE)  237 mL Oral TID BM  . feeding supplement (PRO-STAT SUGAR FREE 64)  30 mL Oral TID WC  . insulin aspart  0-15 Units Subcutaneous TID WC  . insulin aspart  0-5 Units Subcutaneous QHS  . insulin glargine  25 Units Subcutaneous Daily  . insulin starter kit- syringes  1 kit Other Once  . isosorbide mononitrate  60 mg Oral Daily  . lactulose  20 g Oral Daily  . living well with diabetes book   Does not apply Once  . mouth rinse  15 mL Mouth Rinse q12n4p  . methylPREDNISolone  (SOLU-MEDROL) injection  60 mg Intravenous Daily  . metoprolol tartrate  50 mg Oral BID  . multivitamin with minerals  1 tablet Oral Daily  . polyethylene glycol  17 g Oral BID   Continuous Infusions:    LOS: 25 days   Charlynne Cousins  [facility]  11/05/2018, 7:52 AM

## 2018-11-05 NOTE — Procedures (Signed)
Cortrak  Person Inserting Tube:  Austin Page, RD Tube Type:  Cortrak - 43 inches Tube Location:  Left nare Initial Placement:  Stomach Secured by: Bridle Technique Used to Measure Tube Placement:  Documented cm marking at nare/ corner of mouth Cortrak Secured At:  63 cm    Cortrak Tube Team Note:  Consult received to place a Cortrak feeding tube.   No x-ray is required. RN may begin using tube.   If the tube becomes dislodged please keep the tube and contact the Cortrak team at [email or web address] (password [email or web address]) for replacement.  If after hours and replacement cannot be delayed, place a NG tube and confirm placement with an abdominal x-ray.   [name], [place], [place] Pager [phone] After Hours Pager

## 2018-11-05 NOTE — Progress Notes (Signed)
 Nutrition Follow-up  DOCUMENTATION CODES:   Severe malnutrition in context of acute illness/injury  INTERVENTION:   Cortrak Tube placement today  Tube Feeding:  Plan for Nocturnal Feedings 14 hours per day Osmolite 1.5 at 65 ml/hr x 14 hours (1800 to 0800) daily Pro-stat 30 mL TID, change order to per tube Provides 1656 kcals, 102 g of protein. Meets 100% protein needs, 85% calorie needs. Plan to monitor and adjust TF regimen based on po intake  Monitor magnesium, potassium, and phosphorus daily for at least 3 days, MD to replete as needed, as pt is at risk for refeeding syndrome given prolonged poor po intake during acute illness.   Continue Ensure Enlive po TID, each supplement provides 350 kcal and 20 grams of protein  Encourage po intake of meals, feeding assistance and encouragement as able  NUTRITION DIAGNOSIS:   Severe Malnutrition related to acute illness as evidenced by energy intake < or equal to 50% for > or equal to 5 days, severe muscle depletion, severe fat depletion, moderate fat depletion, moderate muscle depletion.  Being addressed via supplements, Cortrak with supplemental EN  GOAL:   Patient will meet greater than or equal to 90% of their needs  progrssing   MONITOR:   PO intake, Supplement acceptance, Weight trends, Labs  REASON FOR ASSESSMENT:   LOS    ASSESSMENT:   68 yo male admitted with no PMH admitted with respiratory distress in setting of COVID-19 illness, hyperglycemia with newly dx DM and hyponatremia. Pt speaks [place]jarati.   Pt is vegetarian and options are very limited, diet was liberalized yesterday; pt receiving very little protein on meal trays aside from any supplements that are provided. Noted recorded po intake yesterday 0% of meals. Pt eating minimally since admission, recorded po of 100% on 6/10 was just 100% of a banana. Noted a few meals recorded as 75 or 90% but mostly eating 0-10%. Pt is drinking some Ensure; did drink 100% of  Ensure last night. Only drank 50% of Ensure this AM, no breakfast.   Cortrak service available today; discussed recommendation of insertion of Cortrak tube for supplemental enteral nutrition given prolonged inadequate oral intake with Atttending MD. MD agreeable with this plan  CBGs 122-277; difficult to manage given inconsistent po intake  NFPE completed today; pt with areas of severe muscle wasting and severe fat loss. Pt meets criteria for SEVERE MALNUTRITION in ACUTE illness  Sodium trending up, noted free water 200 mL q 8 hours per tube ordered by MD today   Labs: sodium 145 (wdl), Creatinine wdl, BUN 43, mag 2.7, CBGs 122-277 Meds: NS at 75 ml/hr, lactulose, MVI, ss novolog, lantus  Diet Order:   Diet Order            Diet vegetarian Room service appropriate? Yes; Fluid consistency: Thin  Diet effective now              EDUCATION NEEDS:   Not appropriate for education at this time  Skin:  Skin Assessment: Reviewed RN Assessment  Last BM:  6/15  Height:   Ht Readings from Last 1 Encounters:  10-30-18 5\' 7"  (1.702 m)    Weight:   Wt Readings from Last 1 Encounters:  11/04/18 58.4 kg    BMI:  Body mass index is 20.17 kg/m.  Estimated Nutritional Needs:   Kcal:  1850-2050 kcals  Protein:  95-105 g  Fluid:  >/= 1.8 L    Kimila Papaleo MS, RDN, LDN, CNSC [phone] Pager  [phone]  [phone] Weekend/On-Call Pager

## 2018-11-05 NOTE — Progress Notes (Signed)
Patient had an episode where he may have vagal down or possibly O2 desatted too low.  Hard to know for sure as I was not in the room, and it looks like he may have been off the pulse ox at the time.  The event happened as followed.  Nurse tech stated he walked into room and patient was attempting to get out of bed.  He wanted to use the commode.  Nurse tech stated he helped patient to commode and patient lost all strength during the transfer.  Nurse tech was able to get him to the commode, but patient was not following commands or responding.  Nurse tech called for help.  This RN walked into room with a few other nurses. Patient was on commode and looked very weak and lethargic. He opened his eyes, but barely responded verbally.  His O2 sats were 40%.  Non-Rebreather was placed on patient at 15L.  O2 saturations started to increase.  Patient was responding and answering questions within a few minutes.  The patient was not on tele, but leads were placed on him at this time.  Patient was Sinus Tach 130s.  All together we picked up patient and put him in bed.   By the time he was back in bed, patients O2 saturations were in the 90s, HR was still ST 110-120s.  BP was also high 176/115.  Within 66min patient BP was 136/88.  By this time patient was resting comfortably and O2 saturations were 100%.  HFNC was placed back on patient at 6L.  During all this Charge Nurse was outside of room talking to MD.  This RN informed Charge Nurse patient is improving.  The MD was taking a look at patients chart and asked to be called back if patient didn't improve or anything was needed.    Patient stable now and resting comfortably.

## 2018-11-05 NOTE — Progress Notes (Signed)
Occupational Therapy Treatment Patient Details Name: Austin Page MRN: [id number] DOB: 09-Feb-1951 Today's Date: 11/05/2018    History of present illness 68 y.o. male admitted on February 07, 2019 with fever, cough, N/V and generalized weakness.  He was found to be COVID (+) with acute hypoxic respiratory failure, acute on chronic nonspecific CHF, hyponatremia, hypokalemia, anxiety, DM2 (newly dx).  Pt with no significant PMH on file.     OT comments  Audio [employer] [employer] [place] used today [phone]). Improved ability to participate with OT @ bed level. Pt assisted with rolling side - side while changing linens due to urine spill. Pt assisted with his bath and grooming while HOB @ 45 degrees and tilted forward. Completed BU/LE exercise. SpO2 sats inconsistent from 81-100 on 6L, however, pt with increased WOB and using belly breathing. Pt given multiple rest breaks. BP 113/85 after @ 10 min sitting more upright. Educated pt on importance of increasing mobility, pt verbalized understanding and stating he was "happy with our session". Goal is to progress OOB to chair as pt tolerates.   Follow Up Recommendations  SNF;Supervision/Assistance - 24 hour    Equipment Recommendations  3 in 1 bedside commode    Recommendations for Other Services      Precautions / Restrictions Precautions Precautions: Fall;Other (comment) Precaution Comments: monitor O2 sats; coretrack      Mobility Bed Mobility Overal bed mobility: Needs Assistance Bed Mobility: Rolling Rolling: Min assist         General bed mobility comments: Rolling side - side while cleaning pt form urine accident/spill  Transfers                 General transfer comment: Did not attempt today due to increased coughing/gagging /WOB when EOB last session    Balance                                           ADL either performed or assessed with clinical judgement   ADL Overall ADL's : Needs assistance/impaired    Eating/Feeding Details (indicate cue type and reason): Had coretrack placed Grooming: Minimal assistance Grooming Details (indicate cue type and reason): Cleaned mouth with toothette; assistance needed to clean thouroughly Upper Body Bathing: Minimal assistance;Bed level   Lower Body Bathing: Moderate assistance;Bed level Lower Body Bathing Details (indicate cue type and reason): Pt able to clean groin area an dtops of thighs; reached around in L sidelying ot help clean bottom Upper Body Dressing : Moderate assistance;Bed level;Sitting                     General ADL Comments: Participated in ADL @ bed level. Pt had urinated in bed/spilled urinal. Bed linens changed and pt assisted with cleaning self. Rolling side to side to assist with min A     Vision       Perception     Praxis      Cognition Arousal/Alertness: Awake/alert Behavior During Therapy: WFL for tasks assessed/performed Overall Cognitive Status: Within Functional Limits for tasks assessed                                          Exercises Exercises: General Upper Extremity;General Lower Extremity General Exercises - Upper Extremity Shoulder Flexion: AROM;Both;20 reps;Supine Shoulder ABduction: AROM;Strengthening;Both;20 reps;Supine Elbow  Flexion: AROM;Strengthening;Both;20 reps;Supine Elbow Extension: AROM;Strengthening;Both;20 reps;Supine Digit Composite Flexion: AROM;Strengthening;Both;20 reps;Seated General Exercises - Lower Extremity Ankle Circles/Pumps: Strengthening;Both;15 reps;Supine Heel Slides: Strengthening;Both;10 reps;Supine Straight Leg Raises: Strengthening;Both;10 reps;Supine   Shoulder Instructions       General Comments      Pertinent Vitals/ Pain       Pain Assessment: Faces Faces Pain Scale: Hurts a little bit Pain Location: general discomfort Pain Descriptors / Indicators: Discomfort Pain Intervention(s): Limited activity within patient's tolerance  Home  Living                                          Prior Functioning/Environment              Frequency  Min 3X/week        Progress Toward Goals  OT Goals(current goals can now be found in the care plan section)  Progress towards OT goals: Progressing toward goals  Acute Rehab OT Goals Patient Stated Goal: to get stronger OT Goal Formulation: With patient Time For Goal Achievement: 11/17/18 Potential to Achieve Goals: Good ADL Goals Pt Will Perform Grooming: with set-up;sitting Pt Will Perform Upper Body Bathing: with set-up;with supervision;sitting Pt Will Perform Lower Body Bathing: with min assist;sit to/from stand Pt Will Transfer to Toilet: with min guard assist;bedside commode Pt Will Perform Toileting - Clothing Manipulation and hygiene: with set-up;with supervision;sitting/lateral leans;sit to/from stand Pt/caregiver will Perform Home Exercise Program: Increased strength;With theraband;With written HEP provided;With Supervision  Plan Discharge plan remains appropriate    Co-evaluation                 AM-PAC OT "6 Clicks" Daily Activity     Outcome Measure   Help from another person eating meals?: A Little Help from another person taking care of personal grooming?: A Little Help from another person toileting, which includes using toliet, bedpan, or urinal?: A Lot Help from another person bathing (including washing, rinsing, drying)?: A Lot Help from another person to put on and taking off regular upper body clothing?: A Lot Help from another person to put on and taking off regular lower body clothing?: A Lot 6 Click Score: 14    End of Session Equipment Utilized During Treatment: Oxygen(6L)  OT Visit Diagnosis: Other abnormalities of gait and mobility (R26.89);Muscle weakness (generalized) (M62.81);Pain Pain - part of body: (general discomfort)   Activity Tolerance Patient tolerated treatment well   Patient Left in bed;with call  bell/phone within reach(semi chair position)   Nurse Communication Mobility status;Other (comment)(increase sittingtolerance)        Time: 6759-1638 OT Time Calculation (min): 50 min  Charges: OT General Charges $OT Visit: 1 Visit OT Treatments $Self Care/Home Management : 23-37 mins $Therapeutic Exercise: 8-22 mins  Maurie Boettcher, OT/L   Acute OT Clinical Specialist Acute Rehabilitation Services Pager [phone] Office [phone]    [facility] 11/05/2018, 3:07 PM

## 2018-11-05 NOTE — Progress Notes (Signed)
[facility] Department and AHF Team  ReDS Research Project   Patient #: [id number]  ReDS Measurement  Right: 32%  Left: low quality x 3

## 2018-11-06 ENCOUNTER — Inpatient Hospital Stay (HOSPITAL_COMMUNITY): Payer: BC Managed Care – PPO

## 2018-11-06 DIAGNOSIS — E43 Unspecified severe protein-calorie malnutrition: Secondary | ICD-10-CM | POA: Insufficient documentation

## 2018-11-06 LAB — COMPREHENSIVE METABOLIC PANEL
ALT: 39 U/L (ref 0–44)
AST: 25 U/L (ref 15–41)
Albumin: 3.2 g/dL — ABNORMAL LOW (ref 3.5–5.0)
Alkaline Phosphatase: 87 U/L (ref 38–126)
Anion gap: 11 (ref 5–15)
BUN: 50 mg/dL — ABNORMAL HIGH (ref 8–23)
CO2: 37 mmol/L — ABNORMAL HIGH (ref 22–32)
Calcium: 9.5 mg/dL (ref 8.9–10.3)
Chloride: 99 mmol/L (ref 98–111)
Creatinine, Ser: 0.65 mg/dL (ref 0.61–1.24)
GFR calc Af Amer: >60 mL/min (ref >60)
GFR calc non Af Amer: >60 mL/min (ref >60)
Glucose, Bld: 267 mg/dL — ABNORMAL HIGH (ref 70–99)
Potassium: 3.8 mmol/L (ref 3.5–5.1)
Sodium: 147 mmol/L — ABNORMAL HIGH (ref 135–145)
Total Bilirubin: 0.4 mg/dL (ref 0.3–1.2)
Total Protein: 5.8 g/dL — ABNORMAL LOW (ref 6.5–8.1)

## 2018-11-06 LAB — GLUCOSE, CAPILLARY
Glucose-Capillary: 218 mg/dL — ABNORMAL HIGH (ref 70–99)
Glucose-Capillary: 283 mg/dL — ABNORMAL HIGH (ref 70–99)
Glucose-Capillary: 175 mg/dL — ABNORMAL HIGH (ref 70–99)
Glucose-Capillary: 113 mg/dL — ABNORMAL HIGH (ref 70–99)
Glucose-Capillary: 101 mg/dL — ABNORMAL HIGH (ref 70–99)

## 2018-11-06 LAB — CBC WITH DIFFERENTIAL/PLATELET
Abs Immature Granulocytes: 0.02 10*3/uL (ref 0.00–0.07)
Basophils Absolute: 0 10*3/uL (ref 0.0–0.1)
Basophils Relative: 0 %
Eosinophils Absolute: 0.1 10*3/uL (ref 0.0–0.5)
Eosinophils Relative: 1 %
HCT: 52 % (ref 39.0–52.0)
Hemoglobin: 16.5 g/dL (ref 13.0–17.0)
Immature Granulocytes: 0 %
Lymphocytes Relative: 8 %
Lymphs Abs: 0.8 10*3/uL (ref 0.7–4.0)
MCH: 31.3 pg (ref 26.0–34.0)
MCHC: 31.7 g/dL (ref 30.0–36.0)
MCV: 98.5 fL (ref 80.0–100.0)
Monocytes Absolute: 0.5 10*3/uL (ref 0.1–1.0)
Monocytes Relative: 5 %
Neutro Abs: 8.8 10*3/uL — ABNORMAL HIGH (ref 1.7–7.7)
Neutrophils Relative %: 86 %
Platelets: 189 10*3/uL (ref 150–400)
RBC: 5.28 MIL/uL (ref 4.22–5.81)
RDW: 15 % (ref 11.5–15.5)
WBC: 10.2 10*3/uL (ref 4.0–10.5)
nRBC: 0 % (ref 0.0–0.2)

## 2018-11-06 LAB — FERRITIN: Ferritin: 179 ng/mL (ref 24–336)

## 2018-11-06 LAB — BRAIN NATRIURETIC PEPTIDE: B Natriuretic Peptide: 97.8 pg/mL (ref 0.0–100.0)

## 2018-11-06 LAB — C-REACTIVE PROTEIN: CRP: 0.8 mg/dL (ref <1.0)

## 2018-11-06 LAB — MAGNESIUM: Magnesium: 2.5 mg/dL — ABNORMAL HIGH (ref 1.7–2.4)

## 2018-11-06 LAB — PHOSPHORUS: Phosphorus: 3.3 mg/dL (ref 2.5–4.6)

## 2018-11-06 MED ORDER — FREE WATER
300.0000 mL | Freq: Three times a day (TID) | Status: DC
  Administered 2018-11-06 – 2018-11-07 (×3): 300 mL

## 2018-11-06 MED ORDER — INSULIN ASPART 100 UNIT/ML ~~LOC~~ SOLN: 0.0000 [IU] | Freq: Every day | SUBCUTANEOUS | Status: DC

## 2018-11-06 MED ORDER — BENZONATATE 100 MG PO CAPS
100.0000 mg | ORAL_CAPSULE | Freq: Three times a day (TID) | ORAL | Status: DC | PRN
  Filled 2018-11-06: qty 1

## 2018-11-06 MED ORDER — INSULIN GLARGINE 100 UNIT/ML ~~LOC~~ SOLN
20.0000 [IU] | Freq: Two times a day (BID) | SUBCUTANEOUS | Status: DC
  Administered 2018-11-06 (×2): 20 [IU] via SUBCUTANEOUS
  Filled 2018-11-06 (×3): qty 0.2

## 2018-11-06 MED ORDER — INSULIN ASPART 100 UNIT/ML ~~LOC~~ SOLN
4.0000 [IU] | Freq: Three times a day (TID) | SUBCUTANEOUS | Status: DC
  Administered 2018-11-06 – 2018-11-07 (×5): 4 [IU] via SUBCUTANEOUS

## 2018-11-06 MED ORDER — METOPROLOL TARTRATE 25 MG PO TABS: 12.5000 mg | ORAL_TABLET | Freq: Two times a day (BID) | ORAL | Status: DC

## 2018-11-06 MED ORDER — SODIUM CHLORIDE 0.45 % IV SOLN: INTRAVENOUS | Status: DC

## 2018-11-06 MED ORDER — METOPROLOL TARTRATE 25 MG PO TABS
25.0000 mg | ORAL_TABLET | Freq: Once | ORAL | Status: AC
  Administered 2018-11-06: 25 mg via ORAL
  Filled 2018-11-06: qty 1

## 2018-11-06 MED ORDER — METHYLPREDNISOLONE SODIUM SUCC 40 MG IJ SOLR
20.0000 mg | Freq: Every day | INTRAMUSCULAR | Status: DC
  Administered 2018-11-06: 20 mg via INTRAVENOUS
  Filled 2018-11-06: qty 1

## 2018-11-06 MED ORDER — INSULIN ASPART 100 UNIT/ML ~~LOC~~ SOLN
0.0000 [IU] | Freq: Three times a day (TID) | SUBCUTANEOUS | Status: DC
  Administered 2018-11-06: 8 [IU] via SUBCUTANEOUS
  Administered 2018-11-06: 3 [IU] via SUBCUTANEOUS
  Administered 2018-11-07: 11 [IU] via SUBCUTANEOUS
  Administered 2018-11-07: 5 [IU] via SUBCUTANEOUS

## 2018-11-06 MED ORDER — METOPROLOL TARTRATE 25 MG PO TABS
75.0000 mg | ORAL_TABLET | Freq: Two times a day (BID) | ORAL | Status: DC
  Administered 2018-11-06 – 2018-11-07 (×2): 75 mg via ORAL
  Filled 2018-11-06 (×2): qty 3

## 2018-11-06 NOTE — Evaluation (Signed)
Clinical/Bedside Swallow Evaluation Patient Details  Name: Austin Page MRN: [id number] Date of Birth: September 17, 1950  Today's Date: 11/06/2018 Time: SLP Start Time (ACUTE ONLY): 1517 SLP Stop Time (ACUTE ONLY): 1453 SLP Time Calculation (min) (ACUTE ONLY): 36 min  Past Medical History: History reviewed. No pertinent past medical history. Past Surgical History: History reviewed. No pertinent surgical history. HPI:  68 y.o. male admitted on October 24, 2018 with fever, cough, N/V and generalized weakness.  He was found to be COVID (+) with acute hypoxic respiratory failure, acute on chronic nonspecific CHF, hyponatremia, hypokalemia, anxiety, DM2 (newly dx).  Pt with no significant PMH on file. Pt was not intubated during hospitalization.     Assessment / Plan / Recommendation Clinical Impression  Pt has had decreased po intake and coughed while taking pill with thin liquids with RN this morning prompting order for swallow assessment. Evaluation completed with  use of interpreter services. Oral care removed mild amount of dried secretions from hard palate. His vocal quality and intensity is within normal limits. Mildly dyspneic and evidence of deconditioning and decreased overall reserve. Reports he has had some difficulty swallowing solids since admission. Moderate oral dysphagia during observation with regular texture (stir fry veggies and rice) noteable for open mouth mastication pattern with reduced lingual movement. Moderate amount was bilaterally pocketing and SLP assisted to remove food when pt unable given verbal cues. Labial spill with thin liquid due to incomplete labial seal. Softer texture of ice cream/magic cup was easier to manipulate with trace/min residue. Frequent eructation and multiple swallows present initially suspicious for esophageal component. Pt denied previous GER or symptoms of acid indigestion. Throughout assessment he produced 2-3 mild and delayed coughs which were not consistent.  Recommending pt restart po's with full supervision and ST follow up for continued s/s aspiration which may warrant an MBS. Ordering regular texture as to not limit his food choices but discussed with RN that softer textures are appropriate initially (pudding, potatoes, ice cream, soups). Harder textures result in incomplete mastication and increased energy depletion. Pt is a vegetarian and states he prefers Austin Page and Austin Page foods to Austin Page.      SLP Visit Diagnosis: Dysphagia, oral phase (R13.11)    Aspiration Risk  Mild aspiration risk;Moderate aspiration risk    Diet Recommendation Regular;Thin liquid;Other (Comment)(softer textures initially)   Liquid Administration via: Cup Medication Administration: Whole meds with puree Supervision: Patient able to self feed;Full supervision/cueing for compensatory strategies Compensations: Lingual sweep for clearance of pocketing;Small sips/bites;Slow rate Postural Changes: Seated upright at 90 degrees    Other  Recommendations Oral Care Recommendations: Oral care BID   Follow up Recommendations Other (comment)(TBD)      Frequency and Duration min 2x/week  2 weeks       Prognosis Prognosis for Safe Diet Advancement: Good      Swallow Study   General HPI: 68 y.o. male admitted on 10-24-2018 with fever, cough, N/V and generalized weakness.  He was found to be COVID (+) with acute hypoxic respiratory failure, acute on chronic nonspecific CHF, hyponatremia, hypokalemia, anxiety, DM2 (newly dx).  Pt with no significant PMH on file. Pt was not intubated during hospitalization.   Type of Study: Bedside Swallow Evaluation Previous Swallow Assessment: (none) Diet Prior to this Study: NPO Temperature Spikes Noted: No Respiratory Status: Other (comment)(HFNC) History of Recent Intubation: No Behavior/Cognition: Alert;Cooperative;Pleasant mood Oral Cavity Assessment: Dried secretions Oral Care Completed by SLP: Yes Oral Cavity - Dentition: Adequate  natural dentition Vision: Functional for self-feeding Self-Feeding Abilities:  Able to feed self Patient Positioning: Upright in bed Baseline Vocal Quality: Normal Volitional Cough: Strong Volitional Swallow: Able to elicit    Oral/Motor/Sensory Function Overall Oral Motor/Sensory Function: Within functional limits   Ice Chips Ice chips: Within functional limits   Thin Liquid Thin Liquid: Within functional limits Presentation: Cup;Straw    Nectar Thick Nectar Thick Liquid: Not tested   Honey Thick Honey Thick Liquid: Not tested   Puree Puree: Within functional limits   Solid     Solid: Impaired Presentation: Self Fed Oral Phase Impairments: Impaired mastication;Reduced labial seal;Reduced lingual movement/coordination Oral Phase Functional Implications: Right lateral sulci pocketing;Left lateral sulci pocketing;Right anterior spillage;Impaired mastication;Oral residue Pharyngeal Phase Impairments: Throat Clearing - Delayed      Royce MacadamiaLitaker, Obera Stauch Willis 11/06/2018,5:29 PM  Breck CoonsLisa Willis MatamorasLitaker M.Ed Nurse, children'sCCC-SLP Speech-Language Pathologist Pager [phone] Office [phone]

## 2018-11-06 NOTE — Progress Notes (Signed)
[facility] and AHF Team  ReDS Research Project   Patient #: [id number]  ReDS Measurement  Right: 30 %  Left: 34 %

## 2018-11-06 NOTE — Progress Notes (Signed)
Physical Therapy Treatment Patient Details Name: Austin Page MRN: [id number] DOB: 1950-09-27 Today's Date: 11/06/2018    History of Present Illness 68 y.o. male admitted on 10/08/2018 with fever, cough, N/V and generalized weakness.  He was found to be COVID (+) with acute hypoxic respiratory failure, acute on chronic nonspecific CHF, hyponatremia, hypokalemia, anxiety, DM2 (newly dx).  Pt with no significant PMH on file.      PT Comments    The patient is lethargic, aroused enough to reach up to rails. Required assist to slide up in bed. Patient  Did not assist with any LE ROM. Patient's  Functional staus and ability to participate have been decreasing.    Follow Up Recommendations  SNF     Equipment Recommendations    TBA   Recommendations for Other Services       Precautions / Restrictions Precautions Precaution Comments: monitor O2 sats, very tenuous. required 7  L HFNC    Mobility  Bed Mobility     Rolling: Max assist         General bed mobility comments: max assist to return to supine and to slide up in bed. Patient did place hands on the rails  but did not attempt to pull up.  Transfers                    Ambulation/Gait                 Stairs             Wheelchair Mobility    Modified Rankin (Stroke Patients Only)       Balance                                            Cognition Arousal/Alertness: Lethargic                                            Exercises      General Comments        Pertinent Vitals/Pain Pain Assessment: Faces Faces Pain Scale: No hurt    Home Living                      Prior Function            PT Goals (current goals can now be found in the care plan section) Progress towards PT goals: Not progressing toward goals - comment(patient is very weak and lethargic today)    Frequency    Min 3X/week      PT Plan Current plan  remains appropriate    Co-evaluation              AM-PAC PT "6 Clicks" Mobility   Outcome Measure  Help needed turning from your back to your side while in a flat bed without using bedrails?: Total Help needed moving from lying on your back to sitting on the side of a flat bed without using bedrails?: Total Help needed moving to and from a bed to a chair (including a wheelchair)?: Total Help needed standing up from a chair using your arms (e.g., wheelchair or bedside chair)?: Total Help needed to walk in hospital room?: Total Help needed climbing 3-5 steps with a railing? :  Total 6 Click Score: 6    End of Session Equipment Utilized During Treatment: Oxygen Activity Tolerance: Patient limited by lethargy Patient left: in bed;with call bell/phone within reach Nurse Communication: Mobility status PT Visit Diagnosis: Muscle weakness (generalized) (M62.81);Difficulty in walking, not elsewhere classified (R26.2)     Time: 8527-7824 PT Time Calculation (min) (ACUTE ONLY): 11 min  Charges:  $Therapeutic Activity: 8-22 mins                     Tresa Endo PT Acute Rehabilitation Services Pager [phone] Office [phone]    Claretha Cooper 11/06/2018, 5:46 PM

## 2018-11-06 NOTE — Progress Notes (Signed)
[facility] PROGRESS NOTE    Progress Note  Austin Page  TIW:[id number] DOB: 1950/10/08 DOA: 09/24/2018 PCP: Patient, [name]     Brief Narrative:   Austin Page is an 68 y.o. male with no significant past medical history admitted on 10/13/2018 for 3 days of fever chills cough and vomiting, with a positive SARS-CoV-2 PCR positive chest external groundglass opacity he rated requiring 15 L of high flow nasal cannula was transferred to the ICU on 10/13/2018 completed his course of IV steroids, IV Remdesivir and 2 doses of Actemra.  Out of the ICU on 10/19/2018  Actemra 2 doses 10/12/2018 and 10/13/2018 Convalescent plasma on 10/13/2018 IV Remdesivir 5/24/202020 5 days  Assessment/Plan:   Acute respiratory distress syndrome (ARDS) due to COVID-19 virus/ Acute respiratory disease due to COVID-19 virus: Continue taper off IV steroids. Has a poor prognoses I spoke personally to the daughter on 11/05/2018 Possible discharged on LTAC vs SNF. Had an episode of desaturation overnight when he went to the bedside commode, he was placed back on high flow nasal cannula and saturations have been greater than 100%.  Acute on chronic nonspecific diastolic heart failure: Continue strict I's and O's Daily weights. Cont  beta-blockers. Discontinue IV fluids, now that he has a cortrack will start him on free water per tube. Is actually awake and able to drink.  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 Now resolved.  Hypokalemia Replete orally now improved.  Constipation noted on x-ray on 11/02/2018: Started on aggressive bowel regimen now resolved.  Newly diagnosed diabetes mellitus type 2: A1c of 10.7, he has been planning on long-acting insulin with sliding scale. We will change sliding scale insulin to moderate scale, now that we started tube feedings on him. Probably be a good candidate for oral metformin.  New sinus tachycardia: Check CXR, place NPO. Check SLP.  Acute confusional  state: Now resolved question likely due to hypovolemia  Leukocytosis: He has remained afebrile is now resolved.  Severe protein caloric malnutrition: Malnutrition Type: Core track in place nutrition has been started, continue free water per tube. He expressed that he does not like the food in the hospital he is a vegetarian. The nurse and I have talked and hopefully his family can bring him food to the hospital, as the patient relates that he just does not like the food in the hospital and is not willing to eat it. I expect his albumin will continue to go down.  Nutrition Problem: Severe Malnutrition Etiology: acute illness   Malnutrition Characteristics:  Signs/Symptoms: energy intake < or equal to 50% for > or equal to 5 days, severe muscle depletion, severe fat depletion, moderate fat depletion, moderate muscle depletion   Nutrition Interventions:  Interventions: Ensure Enlive (each supplement provides 350kcal and 20 grams of protein), MVI, Liberalize Diet, Prostat    DVT prophylaxis: lovenox Family Communication:Daqughter Disposition Plan/Barrier to D/C: SNF VS LTAC Code Status:     Code Status Orders  (From admission, onward)         Start     Ordered   10/25/18 1057  Do not attempt resuscitation (DNR)  Continuous    Question Answer Comment  In the event of cardiac or respiratory ARREST Do not call a "code blue"   In the event of cardiac or respiratory ARREST Do not perform Intubation, CPR, defibrillation or ACLS   In the event of cardiac or respiratory ARREST Use medication by any route, position, wound care, and other measures to relive  pain and suffering. May use oxygen, suction and manual treatment of airway obstruction as needed for comfort.      10/25/18 1056        Code Status History    Date Active Date Inactive Code Status Order ID Comments User Context   10/11/2018 0517 10/25/2018 1056 Full Code [id number]  [name], MD ED   Advance Care Planning  Activity        IV Access:    Peripheral IV   Procedures and diagnostic studies:   No results found.   Medical Consultants:    None.  Anti-Infectives:   none  Subjective:    Austin Page this morning is more awake, he relates that he is in the hospital, he would like to have a milkshake, but has no further complaints.  Objective:    Vitals:   11/06/18 0435 11/06/18 0512 11/06/18 0533 11/06/18 0545  BP:      Pulse: (!) 111 (!) 116 (!) 124 (!) 121  Resp: (!) 35 (!) 27 (!) 37 (!) 37  Temp:      TempSrc:      SpO2: (!) 87% (!) 77% 96% 100%  Weight:      Height:        Intake/Output Summary (Last 24 hours) at 11/06/2018 0728 Last data filed at 11/06/2018 0533 Gross per 24 hour  Intake 834.82 ml  Output 251 ml  Net 583.82 ml   Filed Weights   10/16/2018 2035 11/04/18 1202  Weight: 72.6 kg 58.4 kg    Exam: General exam: In no acute distress. Respiratory system: Good air movement and clear to auscultation. Cardiovascular system: S1 & S2 heard, RRR.  Gastrointestinal system: Abdomen is nondistended, soft and nontender.  Central nervous system: Alert and oriented. No focal neurological deficits. Extremities: No pedal edema. Skin: No rashes, lesions or ulcers Psychiatry: Judgement and insight appear normal. Mood & affect appropriate.       Data Reviewed:    Labs: Basic Metabolic Panel: Recent Labs  Lab 11/01/18 0620 11/02/18 0330 11/03/18 0328 11/05/18 0252 11/05/18 1127 11/06/18 0300  NA 140 140 141 145  --  147*  K 4.5 4.8 4.2 3.9  --  3.8  CL 96* 95* 95* 95*  --  99  CO2 34* 33* 33* 36*  --  37*  GLUCOSE 72 100* 33* 164*  --  267*  BUN 50* 40* 40* 43*  --  50*  CREATININE 0.72 0.70 0.71 0.71  --  0.65  CALCIUM 9.4 9.2 9.5 10.3  --  9.5  MG 2.7* 2.7* 2.8* 2.7* 2.6* 2.5*  PHOS  --   --   --   --  5.0* 3.3   GFR Estimated Creatinine Clearance: 74 mL/min (by C-G formula based on SCr of 0.65 mg/dL). Liver Function Tests: Recent Labs   Lab 11/01/18 0620 11/02/18 0330 11/03/18 0328 11/05/18 0252 11/06/18 0300  AST _0 ALT 39 37 38 42 39  ALKPHOS 65 66 72 70 87  BILITOT 0.8 0.8 0.8 0.8 0.4  PROT 6.0* 6.0* 6.2* 6.1* 5.8*  ALBUMIN 3.4* 3.2* 3.4* 3.3* 3.2*   No results for input(s): LIPASE, AMYLASE in the last 168 hours. No results for input(s): AMMONIA in the last 168 hours. Coagulation profile No results for input(s): INR, PROTIME in the last 168 hours. COVID-19 Labs  Recent Labs    11/04/18 0223 11/05/18 0252 11/06/18 0300  FERRITIN  --  194  --  LDH 270*  --   --   CRP  --  <0.8 0.8    Lab Results  Component Value Date   SARSCOV2NAA POSITIVE (A) 10/15/2018    CBC: Recent Labs  Lab 10/31/18 0300 11/01/18 0620 11/02/18 0330 11/03/18 0328 11/05/18 0252  WBC 14.9* 16.1* 15.6* 17.6* 12.8*  NEUTROABS 13.0* 13.7* 13.7* 14.6* 10.7*  HGB 15.2 15.7 16.4 16.6 16.6  HCT 46.4 47.5 49.6 52.0 52.8*  MCV 91.0 91.7 93.2 94.2 94.8  PLT 278 257 236 254 211   Cardiac Enzymes: No results for input(s): CKTOTAL, CKMB, CKMBINDEX, TROPONINI in the last 168 hours. BNP (last 3 results) No results for input(s): PROBNP in the last 8760 hours. CBG: Recent Labs  Lab 11/04/18 1701 11/04/18 2120 11/05/18 1214 11/05/18 1624 11/05/18 2040  GLUCAP 277* 242* 258* 146* 127*   D-Dimer: No results for input(s): DDIMER in the last 72 hours. Hgb A1c: No results for input(s): HGBA1C in the last 72 hours. Lipid Profile: No results for input(s): CHOL, HDL, LDLCALC, TRIG, CHOLHDL, LDLDIRECT in the last 72 hours. Thyroid function studies: No results for input(s): TSH, T4TOTAL, T3FREE, THYROIDAB in the last 72 hours.  Invalid input(s): FREET3 Anemia work up: Recent Labs    11/05/18 0252  FERRITIN 194   Sepsis Labs: Recent Labs  Lab 11/01/18 0620 11/02/18 0330 11/03/18 0328 11/05/18 0252  WBC 16.1* 15.6* 17.6* 12.8*   Microbiology No results found for this or any previous visit (from the past  240 hour(s)).   Medications:   . chlorhexidine  15 mL Mouth Rinse BID  . clonazepam  0.5 mg Oral BID  . docusate sodium  200 mg Oral BID  . enoxaparin (LOVENOX) injection  40 mg Subcutaneous Q12H  . feeding supplement (ENSURE ENLIVE)  237 mL Oral TID BM  . feeding supplement (OSMOLITE 1.5 CAL)  1,000 mL Per Tube Q24H  . feeding supplement (PRO-STAT SUGAR FREE 64)  30 mL Per Tube TID WC  . free water  200 mL Per Tube Q8H  . insulin aspart  0-15 Units Subcutaneous TID WC  . insulin aspart  0-5 Units Subcutaneous QHS  . insulin glargine  25 Units Subcutaneous Daily  . insulin starter kit- syringes  1 kit Other Once  . isosorbide mononitrate  60 mg Oral Daily  . lactulose  20 g Oral Daily  . living well with diabetes book   Does not apply Once  . mouth rinse  15 mL Mouth Rinse q12n4p  . methylPREDNISolone (SOLU-MEDROL) injection  30 mg Intravenous Daily  . metoprolol tartrate  50 mg Oral BID  . multivitamin with minerals  1 tablet Oral Daily  . polyethylene glycol  17 g Oral BID   Continuous Infusions: . sodium chloride 75 mL/hr at 11/05/18 1800      LOS: 26 days   Charlynne Cousins  [facility]  11/06/2018, 7:28 AM

## 2018-11-07 ENCOUNTER — Inpatient Hospital Stay (HOSPITAL_COMMUNITY): Payer: BC Managed Care – PPO

## 2018-11-07 DIAGNOSIS — J690 Pneumonitis due to inhalation of food and vomit: Secondary | ICD-10-CM

## 2018-11-07 DIAGNOSIS — E43 Unspecified severe protein-calorie malnutrition: Secondary | ICD-10-CM

## 2018-11-07 LAB — COMPREHENSIVE METABOLIC PANEL
ALT: 44 U/L (ref 0–44)
AST: 32 U/L (ref 15–41)
Albumin: 3.1 g/dL — ABNORMAL LOW (ref 3.5–5.0)
Alkaline Phosphatase: 88 U/L (ref 38–126)
Anion gap: 9 (ref 5–15)
BUN: 36 mg/dL — ABNORMAL HIGH (ref 8–23)
CO2: 35 mmol/L — ABNORMAL HIGH (ref 22–32)
Calcium: 9.4 mg/dL (ref 8.9–10.3)
Chloride: 104 mmol/L (ref 98–111)
Creatinine, Ser: 0.55 mg/dL — ABNORMAL LOW (ref 0.61–1.24)
GFR calc Af Amer: >60 mL/min (ref >60)
GFR calc non Af Amer: >60 mL/min (ref >60)
Glucose, Bld: 166 mg/dL — ABNORMAL HIGH (ref 70–99)
Potassium: 4.3 mmol/L (ref 3.5–5.1)
Sodium: 148 mmol/L — ABNORMAL HIGH (ref 135–145)
Total Bilirubin: 0.2 mg/dL — ABNORMAL LOW (ref 0.3–1.2)
Total Protein: 5.6 g/dL — ABNORMAL LOW (ref 6.5–8.1)

## 2018-11-07 LAB — CBC WITH DIFFERENTIAL/PLATELET
Abs Immature Granulocytes: 0.04 10*3/uL (ref 0.00–0.07)
Basophils Absolute: 0 10*3/uL (ref 0.0–0.1)
Basophils Relative: 0 %
Eosinophils Absolute: 0.3 10*3/uL (ref 0.0–0.5)
Eosinophils Relative: 3 %
HCT: 49.8 % (ref 39.0–52.0)
Hemoglobin: 15.7 g/dL (ref 13.0–17.0)
Immature Granulocytes: 0 %
Lymphocytes Relative: 11 %
Lymphs Abs: 1.1 10*3/uL (ref 0.7–4.0)
MCH: 30.8 pg (ref 26.0–34.0)
MCHC: 31.5 g/dL (ref 30.0–36.0)
MCV: 97.8 fL (ref 80.0–100.0)
Monocytes Absolute: 0.6 10*3/uL (ref 0.1–1.0)
Monocytes Relative: 6 %
Neutro Abs: 8.2 10*3/uL — ABNORMAL HIGH (ref 1.7–7.7)
Neutrophils Relative %: 80 %
Platelets: 176 10*3/uL (ref 150–400)
RBC: 5.09 MIL/uL (ref 4.22–5.81)
RDW: 14.9 % (ref 11.5–15.5)
WBC: 10.2 10*3/uL (ref 4.0–10.5)
nRBC: 0 % (ref 0.0–0.2)

## 2018-11-07 LAB — MAGNESIUM: Magnesium: 2.5 mg/dL — ABNORMAL HIGH (ref 1.7–2.4)

## 2018-11-07 LAB — POCT I-STAT 7, (LYTES, BLD GAS, ICA,H+H)
Acid-Base Excess: 7 mmol/L — ABNORMAL HIGH (ref 0.0–2.0)
Bicarbonate: 33.2 mmol/L — ABNORMAL HIGH (ref 20.0–28.0)
Calcium, Ion: 1.31 mmol/L (ref 1.15–1.40)
HCT: 43 % (ref 39.0–52.0)
Hemoglobin: 14.6 g/dL (ref 13.0–17.0)
O2 Saturation: 87 %
Patient temperature: 97.6
Potassium: 3.6 mmol/L (ref 3.5–5.1)
Sodium: 146 mmol/L — ABNORMAL HIGH (ref 135–145)
TCO2: 35 mmol/L — ABNORMAL HIGH (ref 22–32)
pCO2 arterial: 48.7 mmHg — ABNORMAL HIGH (ref 32.0–48.0)
pH, Arterial: 7.438 (ref 7.350–7.450)
pO2, Arterial: 50 mmHg — ABNORMAL LOW (ref 83.0–108.0)

## 2018-11-07 LAB — GLUCOSE, CAPILLARY
Glucose-Capillary: 230 mg/dL — ABNORMAL HIGH (ref 70–99)
Glucose-Capillary: 285 mg/dL — ABNORMAL HIGH (ref 70–99)
Glucose-Capillary: 326 mg/dL — ABNORMAL HIGH (ref 70–99)
Glucose-Capillary: 203 mg/dL — ABNORMAL HIGH (ref 70–99)
Glucose-Capillary: 188 mg/dL — ABNORMAL HIGH (ref 70–99)
Glucose-Capillary: 59 mg/dL — ABNORMAL LOW (ref 70–99)
Glucose-Capillary: 88 mg/dL (ref 70–99)

## 2018-11-07 LAB — C-REACTIVE PROTEIN: CRP: 0.8 mg/dL (ref <1.0)

## 2018-11-07 MED ORDER — METOPROLOL TARTRATE 5 MG/5ML IV SOLN
5.0000 mg | Freq: Four times a day (QID) | INTRAVENOUS | Status: DC | PRN
  Administered 2018-11-07: 5 mg via INTRAVENOUS
  Filled 2018-11-07: qty 5

## 2018-11-07 MED ORDER — SODIUM CHLORIDE 0.9 % IV SOLN
3.0000 g | Freq: Four times a day (QID) | INTRAVENOUS | Status: DC
  Filled 2018-11-07 (×2): qty 3

## 2018-11-07 MED ORDER — DEXTROSE 50 % IV SOLN
INTRAVENOUS | Status: AC
  Administered 2018-11-07: 50 mL
  Filled 2018-11-07: qty 50

## 2018-11-07 MED ORDER — FREE WATER
300.0000 mL | Freq: Four times a day (QID) | Status: DC
  Administered 2018-11-07: 300 mL

## 2018-11-07 MED ORDER — INSULIN GLARGINE 100 UNIT/ML ~~LOC~~ SOLN
22.0000 [IU] | Freq: Two times a day (BID) | SUBCUTANEOUS | Status: DC
  Administered 2018-11-07: 22 [IU] via SUBCUTANEOUS
  Filled 2018-11-07 (×2): qty 0.22

## 2018-11-07 MED ORDER — SODIUM CHLORIDE 0.9 % IV SOLN
3.0000 g | Freq: Four times a day (QID) | INTRAVENOUS | Status: DC
  Administered 2018-11-07: 3 g via INTRAVENOUS
  Filled 2018-11-07 (×3): qty 3

## 2018-11-07 MED ORDER — MORPHINE 100MG IN NS 100ML (1MG/ML) PREMIX INFUSION
1.0000 mg/h | INTRAVENOUS | Status: DC
  Administered 2018-11-07: 1 mg/h via INTRAVENOUS
  Filled 2018-11-07: qty 100

## 2018-11-07 MED ORDER — METHYLPREDNISOLONE SODIUM SUCC 40 MG IJ SOLR
10.0000 mg | Freq: Every day | INTRAMUSCULAR | Status: DC
  Administered 2018-11-07: 14:00:00 10 mg via INTRAVENOUS
  Filled 2018-11-07: qty 1

## 2018-11-07 NOTE — Progress Notes (Signed)
[facility] PROGRESS NOTE    Progress Note  Austin Page  TSV:[id number] DOB: Oct 07, 1950 DOA: 10/16/2018 PCP: Patient, [name]     Brief Narrative:   LEOTIS Page is an 68 y.o. male with no significant past medical history admitted on 09/20/2018 for 3 days of fever chills cough and vomiting, with a positive SARS-CoV-2 PCR positive chest external groundglass opacity he rated requiring 15 L of high flow nasal cannula was transferred to the ICU on 10/13/2018 completed his course of IV steroids, IV Remdesivir and 2 doses of Actemra.  Out of the ICU on 10/19/2018  Actemra 2 doses 10/12/2018 and 10/13/2018 Convalescent plasma on 10/13/2018 IV Remdesivir 5/24/202020 5 days  Assessment/Plan:   Acute respiratory distress syndrome (ARDS) due to COVID-19 virus/ Acute respiratory disease due to COVID-19 virus: Continue IV steroid taper his oxygen requirement is slowly improving.  Today's he is on 2 L high flow nasal cannula satting greater than 97%. He relates he is tired and he appears tired. Possible discharged on LTAC vs SNF.  Acute on chronic nonspecific diastolic heart failure: Continue strict I's and O's Daily weights. Cont  beta-blockers. Continue fluids per NG tube. No tachycardia, start IV metoprolol PRN.  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 Now resolved.  Hypokalemia Replete orally now improved.  Constipation noted on x-ray on 11/02/2018: Started on aggressive bowel regimen now resolved.  Newly diagnosed diabetes mellitus type 2: A1c of 10.7, he has been planning on long-acting insulin with sliding scale. We will change sliding scale insulin to moderate scale, now that we started tube feedings on him. Probably be a good candidate for oral metformin.  New sinus tachycardia: Chest x-ray showed diffuse bilateral infiltrates.  Chest wall emphysema with right axilla extending to the right cervical region.  Acute confusional state: With new encephalopathy, he was  confused and sleepy this morning, question aspiration pneumonia.  Leukocytosis: Has remained afebrile his white blood cell count is 10.5 with a left shift. We will get a CT of the chest he has remained afebrile, I am concerned about aspiration pneumonia. Speech evaluation deemed a mild risk for aspiration and recommended to continue regular thin liquid diet. Empirically on IV Unasyn  Mild new hypernatremia: Increase free water per tube. Recheck labs in the morning.  Severe protein caloric malnutrition: Malnutrition Type: Core track in place nutrition has been started, continue free water per tube. He expressed that he does not like the food in the hospital he is a vegetarian. The nurse and I have talked and hopefully his family can bring him food to the hospital, as the patient relates that he just does not like the food in the hospital and is not willing to eat it. I expect his albumin will continue to go down.  Nutrition Problem: Severe Malnutrition Etiology: acute illness   Malnutrition Characteristics:  Signs/Symptoms: energy intake < or equal to 50% for > or equal to 5 days, severe muscle depletion, severe fat depletion, moderate fat depletion, moderate muscle depletion   Nutrition Interventions:  Interventions: Ensure Enlive (each supplement provides 350kcal and 20 grams of protein), MVI, Liberalize Diet, Prostat    DVT prophylaxis: lovenox Family Communication:[place] Disposition Plan/Barrier to D/C: SNF VS LTAC Code Status:     Code Status Orders  (From admission, onward)         Start     Ordered   10/25/18 1057  Do not attempt resuscitation (DNR)  Continuous    Question Answer Comment  In the  event of cardiac or respiratory ARREST Do not call a "code blue"   In the event of cardiac or respiratory ARREST Do not perform Intubation, CPR, defibrillation or ACLS   In the event of cardiac or respiratory ARREST Use medication by any route, position, wound care,  and other measures to relive pain and suffering. May use oxygen, suction and manual treatment of airway obstruction as needed for comfort.      10/25/18 1056        Code Status History    Date Active Date Inactive Code Status Order ID Comments User Context   10/11/2018 0517 10/25/2018 1056 Full Code [id number]  [name], MD ED   Advance Care Planning Activity        IV Access:    Peripheral IV   Procedures and diagnostic studies:   Dg Chest Port 1 View  Result Date: 11/06/2018 CLINICAL DATA:  Tachycardia EXAM: PORTABLE CHEST 1 VIEW COMPARISON:  Portable exam 0939 hours compared to 10/30/2018 FINDINGS: Feeding tube extends into stomach with tip projecting over pylorus or duodenal bulb region. Normal heart size, mediastinal contours, and pulmonary vascularity. Extensive BILATERAL pulmonary infiltrates again identified. Soft tissue gas identified in RIGHT axilla extending into RIGHT cervical region. Minimal lucency at the superior mediastinum could represent minimal pneumomediastinum. No pleural effusion or pneumothorax. IMPRESSION: Chest wall emphysema in RIGHT axilla extending to RIGHT cervical region and question at to the superior aspect of the mediastinum, of uncertain etiology. No definite pneumothorax. Persistent diffuse BILATERAL pulmonary infiltrates. Findings called to Dr. Aileen Fass on 11/06/2018 at 1130 hours. Electronically Signed   By: Lavonia Dana M.D.   On: 11/06/2018 11:31     Medical Consultants:    None.  Anti-Infectives:  IV Unasyn started on 11/07/2018  Subjective:    Lisette Abu today he is more confused and sleepy.  Objective:    Vitals:   11/06/18 1642 11/06/18 1931 11/07/18 0437 11/07/18 0440  BP: 123/88  (!) 141/78   Pulse: 100     Resp: (!) 21     Temp: (!) 96.8 F (36 C) 97.8 F (36.6 C) 98.3 F (36.8 C)   TempSrc: Axillary Oral Oral   SpO2: 100%     Weight:    61.5 kg  Height:        Intake/Output Summary (Last 24 hours) at  11/07/2018 0716 Last data filed at 11/07/2018 0300 Gross per 24 hour  Intake 2252 ml  Output -  Net 2252 ml   Filed Weights   09/29/2018 2035 11/04/18 1202 11/07/18 0440  Weight: 72.6 kg 58.4 kg 61.5 kg    Exam:General exam: In no acute distress. Respiratory system: Good air movement and diffuse crackles bilaterally Cardiovascular system: S1 & S2 heard, RRR. No JVD.  Gastrointestinal system: Abdomen is nondistended, soft and nontender.  Central nervous system: Moving all 4 extremities without any difficulties. Extremities: No pedal edema. Skin: No rashes, lesions or ulcers Psychiatry: Disoriented and confused.       Data Reviewed:    Labs: Basic Metabolic Panel: Recent Labs  Lab 11/02/18 0330 11/03/18 0328 11/05/18 0252 11/05/18 1127 11/06/18 0300 11/07/18 0300  NA 140 141 145  --  147* 148*  K 4.8 4.2 3.9  --  3.8 4.3  CL 95* 95* 95*  --  99 104  CO2 33* 33* 36*  --  37* 35*  GLUCOSE 100* 33* 164*  --  267* 166*  BUN 40* 40* 43*  --  50*  36*  CREATININE 0.70 0.71 0.71  --  0.65 0.55*  CALCIUM 9.2 9.5 10.3  --  9.5 9.4  MG 2.7* 2.8* 2.7* 2.6* 2.5* 2.5*  PHOS  --   --   --  5.0* 3.3  --    GFR Estimated Creatinine Clearance: 77.9 mL/min (A) (by C-G formula based on SCr of 0.55 mg/dL (L)). Liver Function Tests: Recent Labs  Lab 11/02/18 0330 11/03/18 0328 11/05/18 0252 11/06/18 0300 11/07/18 0300  AST _0 32  ALT 37 38 42 39 44  ALKPHOS 66 72 70 87 88  BILITOT 0.8 0.8 0.8 0.4 0.2*  PROT 6.0* 6.2* 6.1* 5.8* 5.6*  ALBUMIN 3.2* 3.4* 3.3* 3.2* 3.1*   No results for input(s): LIPASE, AMYLASE in the last 168 hours. No results for input(s): AMMONIA in the last 168 hours. Coagulation profile No results for input(s): INR, PROTIME in the last 168 hours. COVID-19 Labs  Recent Labs    11/05/18 0252 11/06/18 0300 11/07/18 0300  FERRITIN 194 179  --   CRP <0.8 0.8 0.8    Lab Results  Component Value Date   SARSCOV2NAA POSITIVE (A) 09/30/2018     CBC: Recent Labs  Lab 11/02/18 0330 11/03/18 0328 11/05/18 0252 11/06/18 0500 11/07/18 0300  WBC 15.6* 17.6* 12.8* 10.2 10.2  NEUTROABS 13.7* 14.6* 10.7* 8.8* 8.2*  HGB 16.4 16.6 16.6 16.5 15.7  HCT 49.6 52.0 52.8* 52.0 49.8  MCV 93.2 94.2 94.8 98.5 97.8  PLT 236 254 211 189 176   Cardiac Enzymes: No results for input(s): CKTOTAL, CKMB, CKMBINDEX, TROPONINI in the last 168 hours. BNP (last 3 results) No results for input(s): PROBNP in the last 8760 hours. CBG: Recent Labs  Lab 11/06/18 1146 11/06/18 1639 11/06/18 1936 11/07/18 0111 11/07/18 0439  GLUCAP 175* 113* 101* 230* 285*   D-Dimer: No results for input(s): DDIMER in the last 72 hours. Hgb A1c: No results for input(s): HGBA1C in the last 72 hours. Lipid Profile: No results for input(s): CHOL, HDL, LDLCALC, TRIG, CHOLHDL, LDLDIRECT in the last 72 hours. Thyroid function studies: No results for input(s): TSH, T4TOTAL, T3FREE, THYROIDAB in the last 72 hours.  Invalid input(s): FREET3 Anemia work up: Recent Labs    11/05/18 0252 11/06/18 0300  FERRITIN 194 179   Sepsis Labs: Recent Labs  Lab 11/03/18 0328 11/05/18 0252 11/06/18 0500 11/07/18 0300  WBC 17.6* 12.8* 10.2 10.2   Microbiology No results found for this or any previous visit (from the past 240 hour(s)).   Medications:   . chlorhexidine  15 mL Mouth Rinse BID  . clonazepam  0.5 mg Oral BID  . docusate sodium  200 mg Oral BID  . enoxaparin (LOVENOX) injection  40 mg Subcutaneous Q12H  . feeding supplement (ENSURE ENLIVE)  237 mL Oral TID BM  . feeding supplement (OSMOLITE 1.5 CAL)  1,000 mL Per Tube Q24H  . feeding supplement (PRO-STAT SUGAR FREE 64)  30 mL Per Tube TID WC  . free water  300 mL Per Tube Q8H  . insulin aspart  0-15 Units Subcutaneous TID WC  . insulin aspart  0-5 Units Subcutaneous QHS  . insulin aspart  4 Units Subcutaneous TID WC  . insulin glargine  20 Units Subcutaneous BID  . insulin starter kit- syringes  1 kit  Other Once  . isosorbide mononitrate  60 mg Oral Daily  . lactulose  20 g Oral Daily  . living well with diabetes book   Does not apply  Once  . mouth rinse  15 mL Mouth Rinse q12n4p  . methylPREDNISolone (SOLU-MEDROL) injection  20 mg Intravenous Daily  . metoprolol tartrate  75 mg Oral BID  . multivitamin with minerals  1 tablet Oral Daily  . polyethylene glycol  17 g Oral BID   Continuous Infusions:     LOS: 27 days   Charlynne Cousins  [facility]  11/07/2018, 7:16 AM

## 2018-11-07 NOTE — Progress Notes (Addendum)
It is my impression that the patient has gotten worse, over the last several days, he is now developed encephalopathy and probable aspiration pneumonia. He is tachycardic and is now lethargic. We have informed the family that if arrest the chances of successful resuscitation to the point of a meaningful recovery will be extremely low. The family decided after having conversation to move towards comfort care, they would not like any further aggressive treatments or labs. They would like for their father to be comfortable. They would like to come and visit him. I spent more than 40 minutes speaking to the family about outcomes and prognoses and about medications and his extremely poor prognoses

## 2018-11-07 NOTE — Progress Notes (Signed)
OT Treatment Note  Pt recognizes this therapist, smiling and states he wants to work. Pt trys to work with OT/Pt but is very limited due to weakness and dyspnea/RR in high 40s. Last visit, pt was able to participate with U/LE exercise and simple grooming tasks @ bed level. Today. Pt requires assistance to lift his arms and waves his hand to signal "enough" due to WOB. Appears to be in distress but participates as much as he can. Pt tolerated modified sitting during session with HOB @ 45 degrees. Pt left in L sidelying and stated he was comfortable although RR was in 40s.     11/07/18 1500  OT Visit Information  Last OT Received On 11/07/18  Assistance Needed +2  PT/OT/SLP Co-Evaluation/Treatment Yes  Reason for Co-Treatment Complexity of the patient's impairments (multi-system involvement)  OT goals addressed during session Strengthening/ROM  History of Present Illness 68 y.o. male admitted on 10/04/2018 with fever, cough, N/V and generalized weakness.  He was found to be COVID (+) with acute hypoxic respiratory failure, acute on chronic nonspecific CHF, hyponatremia, hypokalemia, anxiety, DM2 (newly dx).  Pt with no significant PMH on file.    Precautions  Precautions Fall  Precaution Comments monitor O2 sats, very tenuous. required 7  L HFNC  Pain Assessment  Pain Assessment Faces  Faces Pain Scale 4  Pain Location unable  to determine if in pain  Pain Descriptors / Indicators Grimacing;Discomfort  Cognition  Arousal/Alertness Awake/alert  Behavior During Therapy Restless  Overall Cognitive Status Difficult to assess  General Comments patient with eyes closed mostly, did respond to interpreter at times.  Difficult to assess due to Non-English speaking  Upper Extremity Assessment  Upper Extremity Assessment Generalized weakness;RUE deficits/detail;LUE deficits/detail  RUE Deficits / Details unable to fully lift arm up to ceiling; able to reach mouth; unable to reach top of head  LUE  Deficits / Details Mod A to lift arm off bed  Lower Extremity Assessment  Lower Extremity Assessment Generalized weakness (requires assistnace to move BLE)  ADL  Eating/Feeding Maximal assistance  Eating/Feeding Details (indicate cue type and reason) pt pocketing food  General ADL Comments Pt now requires mod A with simple task of washing face due to weakness and inability to sustain movement pattern  Bed Mobility  Overal bed mobility Needs Assistance  Bed Mobility Rolling  Rolling Max assist;+2 for physical assistance;Total assist;+2 for safety/equipment  General bed mobility comments placed in partial sitting upright in bed, patient restless, falling asleep at times. RR in 40'. Assisted to left side, pt. reports feeling comfortable.  Balance  Sitting balance-Leahy Scale Fair  Transfers  General transfer comment unable to attempt due to medical status  General Exercises - Upper Extremity  Shoulder Flexion Both;AROM;PROM;10 reps;Supine  Shoulder ABduction AAROM;PROM;Both;10 reps;Supine  Elbow Flexion AAROM;AROM;Both;5 reps;Supine  Elbow Extension AROM;AAROM;Both;5 reps;Supine  General Exercises - Lower Extremity  Ankle Circles/Pumps Both;10 reps  OT - End of Session  Activity Tolerance Patient limited by fatigue  Patient left in bed;with call bell/phone within reach (L sidelying)  OT Assessment/Plan  OT Plan Discharge plan remains appropriate  OT Visit Diagnosis Other abnormalities of gait and mobility (R26.89);Muscle weakness (generalized) (M62.81);Pain  Pain - part of body  (general discomfort)  OT Frequency (ACUTE ONLY) Min 3X/week  Follow Up Recommendations SNF;Supervision/Assistance - 24 hour  OT Equipment 3 in 1 bedside commode  AM-PAC OT "6 Clicks" Daily Activity Outcome Measure (Version 2)  Help from another person eating meals? 2  Help from  another person taking care of personal grooming? 2  Help from another person toileting, which includes using toliet, bedpan, or  urinal? 2  Help from another person bathing (including washing, rinsing, drying)? 2  Help from another person to put on and taking off regular upper body clothing? 1  Help from another person to put on and taking off regular lower body clothing? 1  6 Click Score 10  OT Goal Progression  Progress towards OT goals Not progressing toward goals - comment;OT to reassess next treatment  Acute Rehab OT Goals  Patient Stated Goal to get stronger  OT Goal Formulation With patient  Time For Goal Achievement 11/17/18  Potential to Achieve Goals Fair  ADL Goals  Pt Will Perform Grooming with set-up;sitting  Pt Will Perform Upper Body Bathing with set-up;with supervision;sitting  Pt Will Perform Lower Body Bathing with min assist;sit to/from stand  Pt Will Transfer to Toilet with min guard assist;bedside commode  Pt Will Perform Toileting - Clothing Manipulation and hygiene with set-up;with supervision;sitting/lateral leans;sit to/from stand  Pt/caregiver will Perform Home Exercise Program Increased strength;With theraband;With written HEP provided;With Supervision  OT Time Calculation  OT Start Time (ACUTE ONLY) 1230  OT Stop Time (ACUTE ONLY) 1254  OT Time Calculation (min) 24 min  OT General Charges  $OT Visit 1 Visit  OT Treatments  $Therapeutic Activity 8-22 mins  Maurie Boettcher, OT/L   Acute OT Clinical Specialist [place] Pager [phone] Office [phone]

## 2018-11-07 NOTE — Progress Notes (Addendum)
  Speech Language Pathology Treatment: Dysphagia  Patient Details Name: Austin Page MRN: [id number] DOB: 1950-06-04 Today's Date: 11/07/2018 Time: 6644-0347 SLP Time Calculation (min) (ACUTE ONLY): 9 min  Assessment / Plan / Recommendation Clinical Impression  Pt's overall condition appears significantly decreased from yesterday. Lethargic but opens eyes in 20 second intervals. RR in 40's, Spo2 mid 90's. He consumed one straw sip thin liquid with immediate throat clear. SLP did not attempt trial solids. RN reported continued pocketing of food brought from home as suspected he would. She provided oral care to remove. High probability that he is not consistently able to fully protect airway during all meals. Prognosis to improve is questionable. If prognosis was better, may recommend more conservative diet of puree only. Discussed with nurse, at this point, to allow pt to continue regular texture if desired with close supervision and assist to slow his rate and clear oral cavity after each meal. Likely will only be able to consume small amounts. He is more efficient with foods like ice cream, puddings etc. RN made him a protein shake this am with Ensure which he wasn't too fond of. ST will follow as pt able.    Note: tech checked pt's blood sugar at end of meal which was 59. Addendum: Read MD's note and pt is now comfort care.    HPI HPI: 67 y.o. male admitted on 10/16/2018 with fever, cough, N/V and generalized weakness.  He was found to be COVID (+) with acute hypoxic respiratory failure, acute on chronic nonspecific CHF, hyponatremia, hypokalemia, anxiety, DM2 (newly dx).  Pt with no significant PMH on file. Pt was not intubated during hospitalization.        SLP Plan  Continue with current plan of care       Recommendations  Diet recommendations: Regular;Thin liquid Liquids provided via: Straw;Cup Medication Administration: Crushed with puree Supervision: Patient able to self feed;Full  supervision/cueing for compensatory strategies Compensations: Small sips/bites;Slow rate;Lingual sweep for clearance of pocketing Postural Changes and/or Swallow Maneuvers: Seated upright 90 degrees                Oral Care Recommendations: Oral care BID Follow up Recommendations: Skilled Nursing facility SLP Visit Diagnosis: Dysphagia, oropharyngeal phase (R13.12) Plan: Continue with current plan of care       GO                Houston Siren 11/07/2018, 4:00 PM  Austin Page.Ed Risk analyst [phone] Office [phone]

## 2018-11-07 NOTE — Progress Notes (Signed)
Physical Therapy Treatment Patient Details Name: Austin Page MRN: [id number] DOB: 1950-10-11 Today's Date: 11/07/2018    History of Present Illness 68 y.o. male admitted on 09/27/2018 with fever, cough, N/V and generalized weakness.  He was found to be COVID (+) with acute hypoxic respiratory failure, acute on chronic nonspecific CHF, hyponatremia, hypokalemia, anxiety, DM2 (newly dx).  Pt with no significant PMH on file.      PT Comments    The patient is very weak, limited arousal and ability to participate in therapy. At rest, RR 40s, SaO2 I>94%, HR 110-118.  Follow Up Recommendations  SNF     Equipment Recommendations  None recommended by PT    Recommendations for Other Services       Precautions / Restrictions Precautions Precautions: Fall Precaution Comments: monitor O2 sats, very tenuous. required 7  L HFNC    Mobility  Bed Mobility Overal bed mobility: Needs Assistance Bed Mobility: Rolling Rolling: Max assist;+2 for physical assistance;Total assist;+2 for safety/equipment         General bed mobility comments: placed in partial sitting upright in bed, patient restless, falling asleep at times. RR in 40'. Assisted to left side, pt. reports feeling comfortable.  Transfers                    Ambulation/Gait                 Stairs             Wheelchair Mobility    Modified Rankin (Stroke Patients Only)       Balance                                            Cognition Arousal/Alertness: Awake/alert Behavior During Therapy: Restless Overall Cognitive Status: Difficult to assess                                 General Comments: patient with eyes closed mostly, did respond to interpreter at times.      Exercises      General Comments        Pertinent Vitals/Pain Pain Assessment: Faces Pain Location: unable  to determine if in pain    Home Living                       Prior Function            PT Goals (current goals can now be found in the care plan section) Progress towards PT goals: Not progressing toward goals - comment(due to medical decline)    Frequency    Min 3X/week      PT Plan Current plan remains appropriate    Co-evaluation              AM-PAC PT "6 Clicks" Mobility   Outcome Measure  Help needed turning from your back to your side while in a flat bed without using bedrails?: Total   Help needed moving to and from a bed to a chair (including a wheelchair)?: Total Help needed standing up from a chair using your arms (e.g., wheelchair or bedside chair)?: Total Help needed to walk in hospital room?: Total Help needed climbing 3-5 steps with a railing? : Total 6 Click Score: 5  End of Session   Activity Tolerance: Treatment limited secondary to medical complications (Comment) Patient left: in bed;with call bell/phone within reach Nurse Communication: Mobility status PT Visit Diagnosis: Muscle weakness (generalized) (M62.81);Difficulty in walking, not elsewhere classified (R26.2)     Time: 7782-4235 PT Time Calculation (min) (ACUTE ONLY): 15 min  Charges:                        Tresa Endo PT Acute Rehabilitation Services  Office [phone]    Claretha Cooper 11/07/2018, 2:38 PM

## 2018-11-07 NOTE — TOC Progression Note (Signed)
Transition of Care [facility]) - Progression Note    Patient Details  Name: Austin Page MRN: [id number] Date of Birth: 1951/02/05  Transition of [facility]) CM/SW [place], RN Phone Number: 11/07/2018, 2:17 PM  Clinical Narrative:    CM following for dispositional needs. CM discussed the POC with Raquel Sarna, [place] liaison; patient still does not meet LTACH criteria, undocumented without a payor source. Patient is Medicaid pending, with SNF recommended. CM team will continue to follow.    Expected Discharge Plan: Skilled Nursing Facility Barriers to Discharge: Undocumented, Inadequate or no insurance, Other (comment)  Expected Discharge Plan and Services Expected Discharge Plan: [place] In-house Referral: NA Discharge Planning Services: CM Consult, Follow-up appt scheduled, Medication Assistance Post Acute Care Choice: NA Living arrangements for the past 2 months: Single Family Home                     Social Determinants of Health (SDOH) Interventions    Readmission Risk Interventions No flowsheet data found.

## 2018-11-07 NOTE — Plan of Care (Signed)
  Problem: Clinical Measurements: Goal: Respiratory complications will improve Outcome: Progressing   Problem: Education: Goal: Knowledge of General Education information will improve Description: Including pain rating scale, medication(s)/side effects and non-pharmacologic comfort measures Outcome: Not Progressing   Problem: Clinical Measurements: Goal: Ability to maintain clinical measurements within normal limits will improve Outcome: Not Progressing   Problem: Nutrition: Goal: Adequate nutrition will be maintained Outcome: Not Progressing   Problem: Coping: Goal: Level of anxiety will decrease Outcome: Not Progressing

## 2018-11-19 NOTE — Discharge Summary (Signed)
Death Summary  Austin Page:[id number] DOB: 21-Oct-1950 DOA: October 24, 2018  PCP: Patient, [name] PCP/Office notified: no  Admit date: 2018-10-24 Date of Death: Nov 22, 2018  Final Diagnoses:  Principal Problem:   Acute respiratory disease due to COVID-19 virus Active Problems:   Hyperglycemia   Hyponatremia   COVID-19   Dyspnea   Acute respiratory distress syndrome (ARDS) due to COVID-19 virus   Protein-calorie malnutrition, severe   History of present illness:  68 y.o. male with no significant past medical history admitted on 24-Oct-2018 for 3 days of fever chills cough and vomiting, with a positive SARS-CoV-2 PCR positive chest external groundglass opacity he rated requiring 15 L of high flow nasal cannula was transferred to the ICU on 10/13/2018 completed his course of IV steroids, IV Remdesivir and 2 doses of Actemra.  Out of the ICU on 10/19/2018  Hospital Course:  He was admitted to the hospital for acute respiratory failure and ARDS due to COVID-19. He was treated with IV steroids, IV Remdesivir, 2 doses of Actemra and convalescent plasma despite this he remained on 10 to 5 L of high flow nasal cannula he continued to struggle and breathe. He had no appetite and was not eating for the last several days prior to his passing. He had to be placed on IV fluids and NG tube was placed and feedings were started despite this he continues to progressively gotten worse. To the point where he was not eating at all. After explaining the extremely poor prognosis to his family by several physicians on my conversation with the daughter she decided to move towards comfort care, all labs were stopped he was placed on IV morphine and he passed away while his family was in the room.   Time: 35  Signed:  Charlynne Cousins  [facility] November 22, 2018, 11:05 AM

## 2018-11-19 NOTE — Progress Notes (Signed)
[facility] PROGRESS NOTE    Progress Note  Austin Page  EVO:[id number] DOB: 1950/10/24 DOA: 09/25/2018 PCP: Patient, [name]     Brief Narrative:   DELANTE KARAPETYAN is an 68 y.o. male with no significant past medical history admitted on 09/23/2018 for 3 days of fever chills cough and vomiting, with a positive SARS-CoV-2 PCR positive chest external groundglass opacity he rated requiring 15 L of high flow nasal cannula was transferred to the ICU on 10/13/2018 completed his course of IV steroids, IV Remdesivir and 2 doses of Actemra.  Out of the ICU on 10/19/2018  Actemra 2 doses 10/12/2018 and 10/13/2018 Convalescent plasma on 10/13/2018 IV Remdesivir 5/24/202020 5 days  Assessment/Plan:   Acute respiratory distress syndrome (ARDS) due to COVID-19 virus/ Acute respiratory disease due to COVID-19 virus: After discussion with the family on 11/07/2018, they decided to move towards comfort care. We have stop all labs drawn's and started him on morphine IV drip for comfort. I will give him a bolus of IV morphine as the patient is uncomfortable with erratic breathing and moaning.  Acute on chronic nonspecific diastolic heart failure: Hyponatremia: Hypokalemia Constipation noted on x-ray on 11/02/2018: Newly diagnosed diabetes mellitus type 2: New sinus tachycardia: Acute confusional state: Leukocytosis: Mild new hypernatremia: Severe protein caloric malnutrition: Malnutrition Type:  Nutrition Problem: Severe Malnutrition Etiology: acute illness   Malnutrition Characteristics:  Signs/Symptoms: energy intake < or equal to 50% for > or equal to 5 days, severe muscle depletion, severe fat depletion, moderate fat depletion, moderate muscle depletion   Nutrition Interventions:  Interventions: Ensure Enlive (each supplement provides 350kcal and 20 grams of protein), MVI, Liberalize Diet, Prostat    DVT prophylaxis: lovenox Family Communication:[place] Disposition  Plan/Barrier to D/C: Patient will probably pass away in house. Code Status:     Code Status Orders  (From admission, onward)         Start     Ordered   10/25/18 1057  Do not attempt resuscitation (DNR)  Continuous    Question Answer Comment  In the event of cardiac or respiratory ARREST Do not call a code blue   In the event of cardiac or respiratory ARREST Do not perform Intubation, CPR, defibrillation or ACLS   In the event of cardiac or respiratory ARREST Use medication by any route, position, wound care, and other measures to relive pain and suffering. May use oxygen, suction and manual treatment of airway obstruction as needed for comfort.      10/25/18 1056        Code Status History    Date Active Date Inactive Code Status Order ID Comments User Context   10/11/2018 0517 10/25/2018 1056 Full Code [id number]  [name], MD ED   Advance Care Planning Activity        IV Access:    Peripheral IV   Procedures and diagnostic studies:   Ct Chest Wo Contrast  Result Date: 11/07/2018 CLINICAL DATA:  Increased respirations, decreased saturation, COVID-19 EXAM: CT CHEST WITHOUT CONTRAST TECHNIQUE: Multidetector CT imaging of the chest was performed following the standard protocol without IV contrast. COMPARISON:  10/26/2018, 10/11/2018 FINDINGS: Cardiovascular: No significant vascular findings. Cardiomegaly. Three-vessel coronary artery disease. No pericardial effusion. Mediastinum/Nodes: Extensive, new pneumomediastinum. No enlarged mediastinal, hilar, or axillary lymph nodes. Thyroid gland, trachea, and esophagus demonstrate no significant findings. Lungs/Pleura: There is diffuse, extensive bilateral ground-glass pulmonary opacity with a slight basilar predominant distribution, slightly worsened in comparison to prior CT dated 10/26/2018.  No pleural effusion or pneumothorax. Upper Abdomen: No acute abnormality. Musculoskeletal: Extensive subcutaneous emphysema about the  right neck and upper chest. IMPRESSION: 1. There is diffuse, extensive bilateral ground-glass pulmonary opacity with a slight basilar predominant distribution, slightly worsened in comparison to prior CT dated 10/26/2018. Although nonspecific this is generally in keeping with known diagnosis of COVID-19. 2. Extensive, new pneumomediastinum as well this subcutaneous emphysema about right neck and upper chest, possibly related to barotrauma. 3.  Coronary artery disease. Electronically Signed   By: Eddie Candle M.D.   On: 11/07/2018 12:12   Dg Chest Port 1 View  Result Date: 11/06/2018 CLINICAL DATA:  Tachycardia EXAM: PORTABLE CHEST 1 VIEW COMPARISON:  Portable exam 0939 hours compared to 10/30/2018 FINDINGS: Feeding tube extends into stomach with tip projecting over pylorus or duodenal bulb region. Normal heart size, mediastinal contours, and pulmonary vascularity. Extensive BILATERAL pulmonary infiltrates again identified. Soft tissue gas identified in RIGHT axilla extending into RIGHT cervical region. Minimal lucency at the superior mediastinum could represent minimal pneumomediastinum. No pleural effusion or pneumothorax. IMPRESSION: Chest wall emphysema in RIGHT axilla extending to RIGHT cervical region and question at to the superior aspect of the mediastinum, of uncertain etiology. No definite pneumothorax. Persistent diffuse BILATERAL pulmonary infiltrates. Findings called to Dr. Aileen Fass on 11/06/2018 at 1130 hours. Electronically Signed   By: Lavonia Dana M.D.   On: 11/06/2018 11:31     Medical Consultants:    None.  Anti-Infectives:  IV Unasyn started on 11/07/2018  Subjective:    Rashard L Whicker agitated  Objective:    Vitals:   11/07/18 0806 11/07/18 0922 11/07/18 2000 11/07/18 2100  BP:  (!) 144/90    Pulse:  (!) 126 (!) 124 (!) 120  Resp:  (!) 35 (!) 32   Temp: 97.6 F (36.4 C)     TempSrc: Axillary     SpO2:  100% 100% 96%  Weight:      Height:         Intake/Output Summary (Last 24 hours) at 11/22/18 0737 Last data filed at 2018-11-22 0534 Gross per 24 hour  Intake 134.61 ml  Output 850 ml  Net -715.39 ml   Filed Weights   09/20/2018 2035 11/04/18 1202 11/07/18 0440  Weight: 72.6 kg 58.4 kg 61.5 kg    Exam:General exam: In acute distress. Respiratory system: Labored breathing and diffuse crackles bilaterally Cardiovascular system: S1 & S2 heard, RRR. No JVD.  Gastrointestinal system: Abdomen is nondistended, soft and nontender.  Extremities: No pedal edema. Skin: No rashes, lesions or ulcers       Data Reviewed:    Labs: Basic Metabolic Panel: Recent Labs  Lab 11/02/18 0330 11/03/18 0328 11/05/18 0252 11/05/18 1127 11/06/18 0300 11/07/18 0300 11/07/18 1623  NA 140 141 145  --  147* 148* 146*  K 4.8 4.2 3.9  --  3.8 4.3 3.6  CL 95* 95* 95*  --  99 104  --   CO2 33* 33* 36*  --  37* 35*  --   GLUCOSE 100* 33* 164*  --  267* 166*  --   BUN 40* 40* 43*  --  50* 36*  --   CREATININE 0.70 0.71 0.71  --  0.65 0.55*  --   CALCIUM 9.2 9.5 10.3  --  9.5 9.4  --   MG 2.7* 2.8* 2.7* 2.6* 2.5* 2.5*  --   PHOS  --   --   --  5.0* 3.3  --   --  GFR Estimated Creatinine Clearance: 77.9 mL/min (A) (by C-G formula based on SCr of 0.55 mg/dL (L)). Liver Function Tests: Recent Labs  Lab 11/02/18 0330 11/03/18 0328 11/05/18 0252 11/06/18 0300 11/07/18 0300  AST _0 32  ALT 37 38 42 39 44  ALKPHOS 66 72 70 87 88  BILITOT 0.8 0.8 0.8 0.4 0.2*  PROT 6.0* 6.2* 6.1* 5.8* 5.6*  ALBUMIN 3.2* 3.4* 3.3* 3.2* 3.1*   No results for input(s): LIPASE, AMYLASE in the last 168 hours. No results for input(s): AMMONIA in the last 168 hours. Coagulation profile No results for input(s): INR, PROTIME in the last 168 hours. COVID-19 Labs  Recent Labs    11/06/18 0300 11/07/18 0300  FERRITIN 179  --   CRP 0.8 0.8    Lab Results  Component Value Date   SARSCOV2NAA POSITIVE (A) 10/08/2018    CBC: Recent Labs   Lab 11/02/18 0330 11/03/18 0328 11/05/18 0252 11/06/18 0500 11/07/18 0300 11/07/18 1623  WBC 15.6* 17.6* 12.8* 10.2 10.2  --   NEUTROABS 13.7* 14.6* 10.7* 8.8* 8.2*  --   HGB 16.4 16.6 16.6 16.5 15.7 14.6  HCT 49.6 52.0 52.8* 52.0 49.8 43.0  MCV 93.2 94.2 94.8 98.5 97.8  --   PLT 236 254 211 189 176  --    Cardiac Enzymes: No results for input(s): CKTOTAL, CKMB, CKMBINDEX, TROPONINI in the last 168 hours. BNP (last 3 results) No results for input(s): PROBNP in the last 8760 hours. CBG: Recent Labs  Lab 11/07/18 0805 11/07/18 1142 11/07/18 1536 11/07/18 1600 11/07/18 1914  GLUCAP 326* 203* 59* 188* 88   D-Dimer: No results for input(s): DDIMER in the last 72 hours. Hgb A1c: No results for input(s): HGBA1C in the last 72 hours. Lipid Profile: No results for input(s): CHOL, HDL, LDLCALC, TRIG, CHOLHDL, LDLDIRECT in the last 72 hours. Thyroid function studies: No results for input(s): TSH, T4TOTAL, T3FREE, THYROIDAB in the last 72 hours.  Invalid input(s): FREET3 Anemia work up: Recent Labs    11/06/18 0300  FERRITIN 179   Sepsis Labs: Recent Labs  Lab 11/03/18 0328 11/05/18 0252 11/06/18 0500 11/07/18 0300  WBC 17.6* 12.8* 10.2 10.2   Microbiology No results found for this or any previous visit (from the past 240 hour(s)).   Medications:    insulin starter kit- syringes  1 kit Other Once   Continuous Infusions:  morphine 3 mg/hr (11-20-18 2330)      LOS: 28 days   Charlynne Cousins  [facility]  11-20-18, 7:37 AM

## 2018-11-19 NOTE — Progress Notes (Signed)
Wasted 70mL of IV morphine from morphine gtt into the sink, witnessed by [employer], Therapist, sports.

## 2018-11-19 DEATH — deceased

## 2019-08-23 IMAGING — CT CT CHEST WITHOUT CONTRAST
1 of 2 series · 15 of 30 positions shown, 19 images · non-contrast
Comparison: 10/26/2018, 10/11/2018

CLINICAL DATA: Increased respirations, decreased saturation,
[id number]

EXAM:
CT CHEST WITHOUT CONTRAST
TECHNIQUE: Multidetector CT imaging of the chest was performed following the
standard protocol without IV contrast.

[Series 4: (person_name) thins · axial · 0.59mm/px · z∈[-651,-390]mm · 15 of 407 slices shown, 19 images]
[im 17/407  mediastinal]
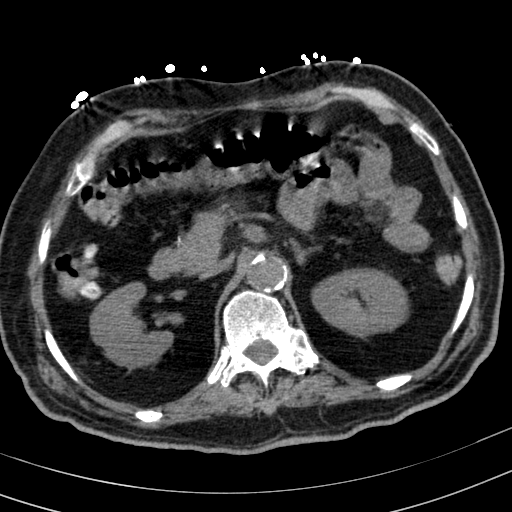
[im 17/407  lung]
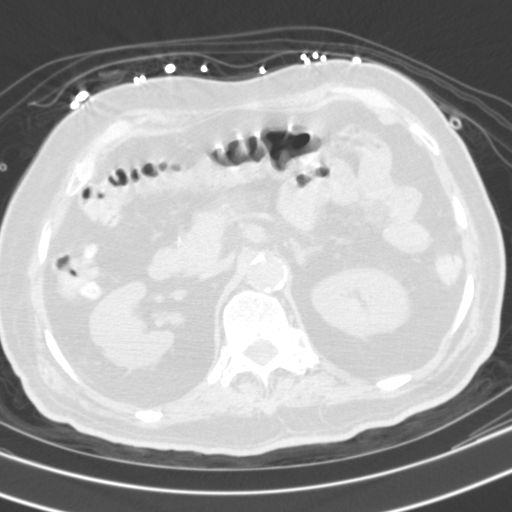
[im 51/407  lung]
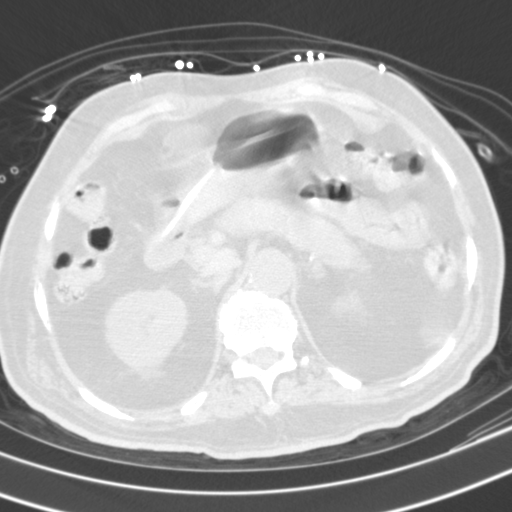
[im 85/407  lung]
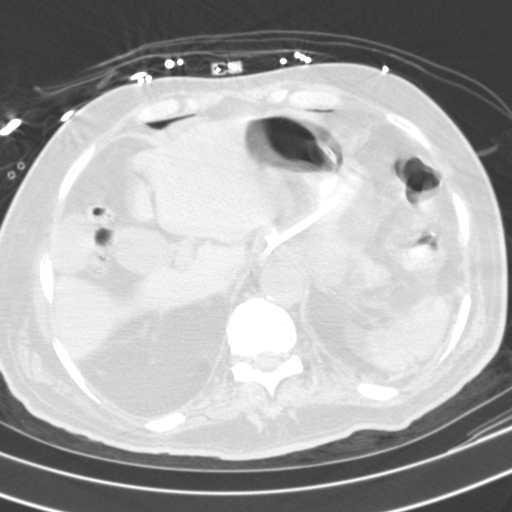
[im 102/407  lung]
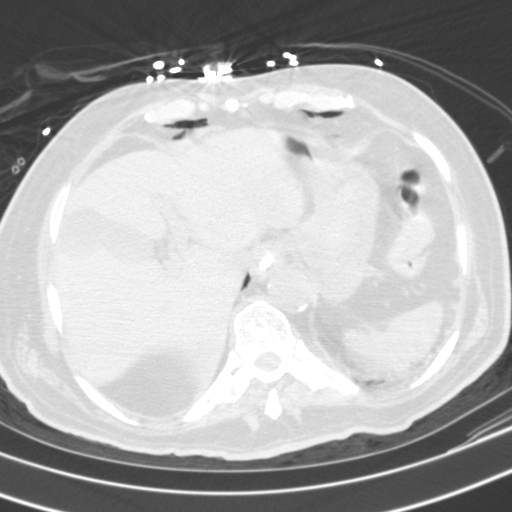
[im 136/407  mediastinal]
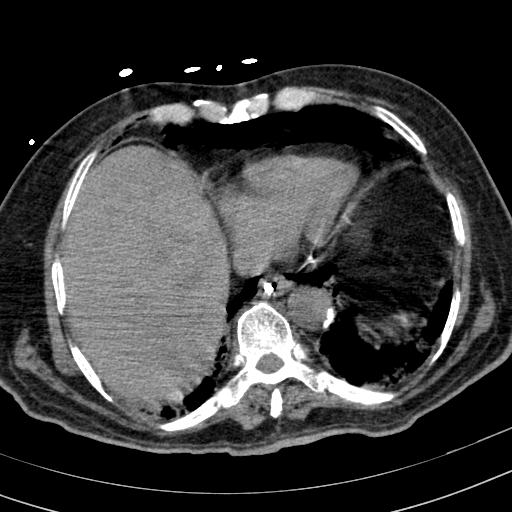
[im 136/407  lung]
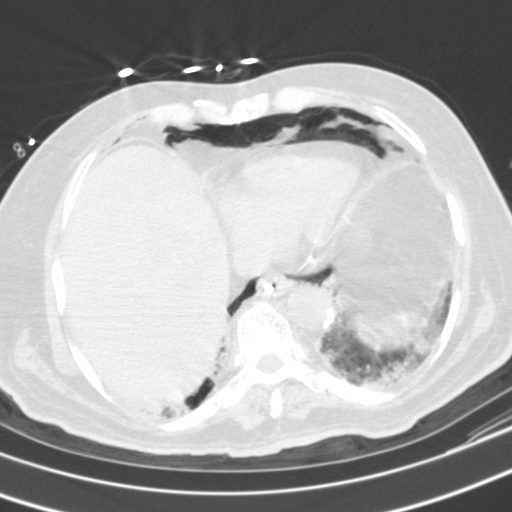
[im 153/407  lung]
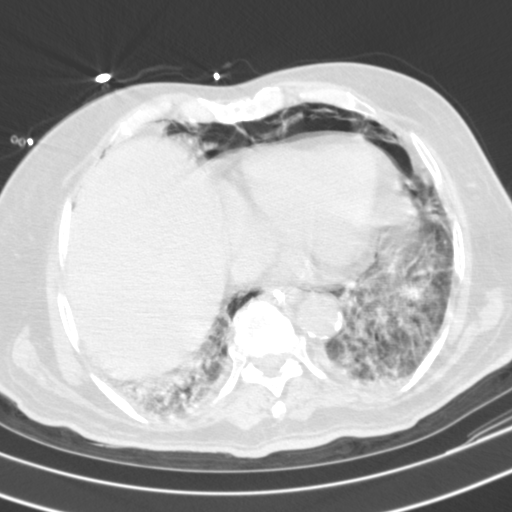
[im 187/407  lung]
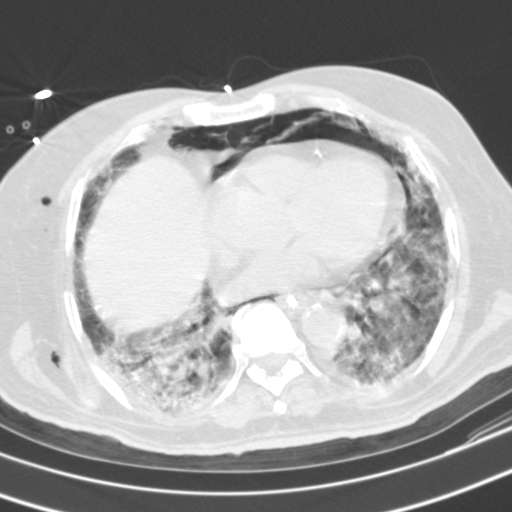
[im 204/407  lung]
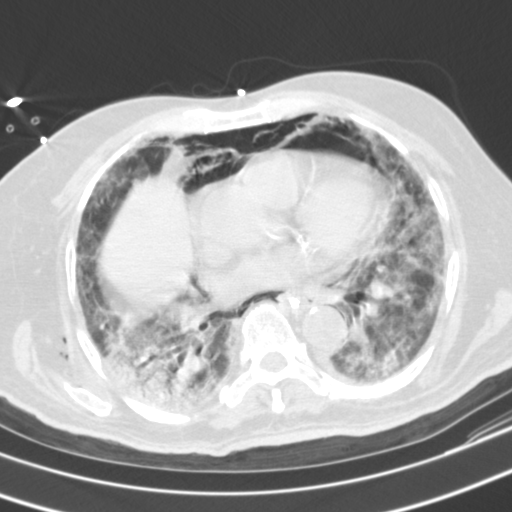
[im 220/407  mediastinal]
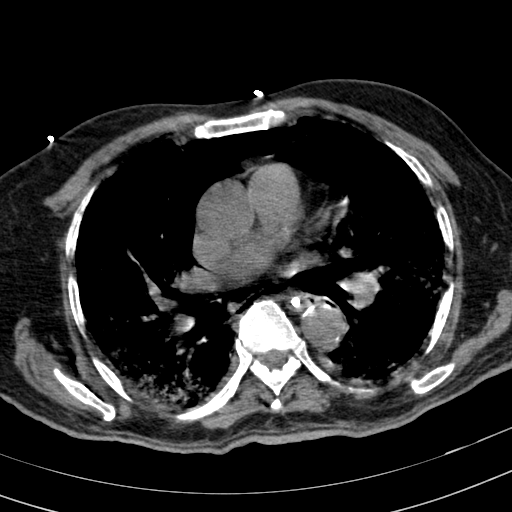
[im 220/407  lung]
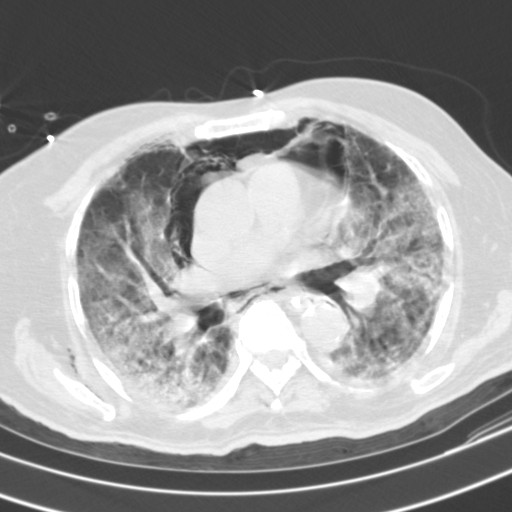
[im 254/407  lung]
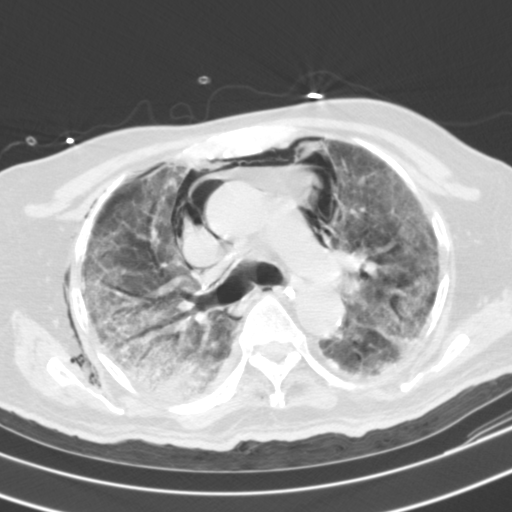
[im 271/407  lung]
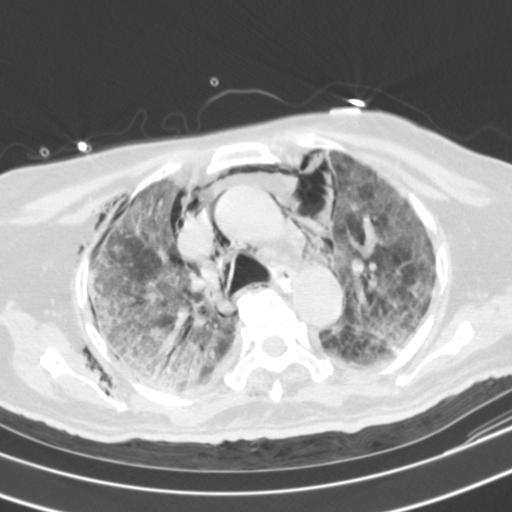
[im 305/407  lung]
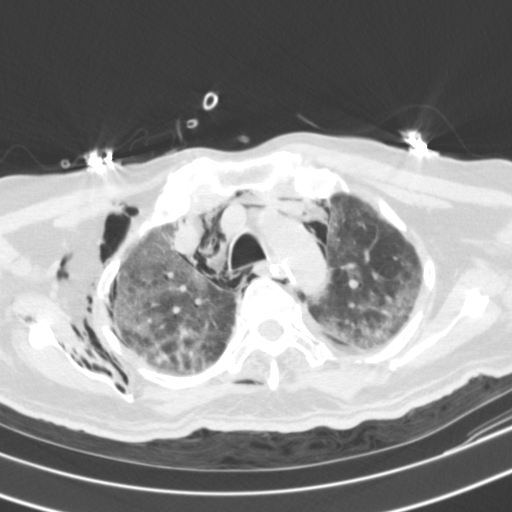
[im 339/407  mediastinal]
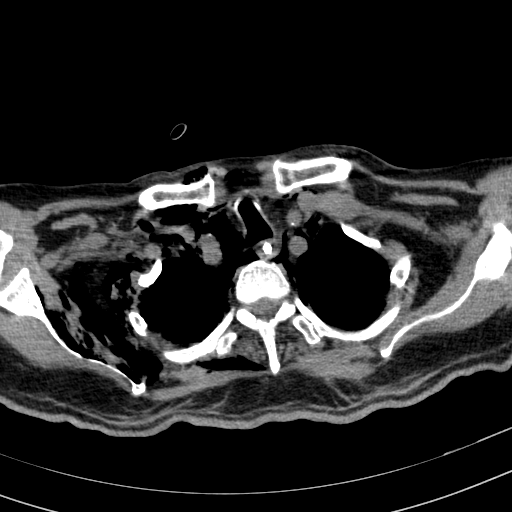
[im 339/407  lung]
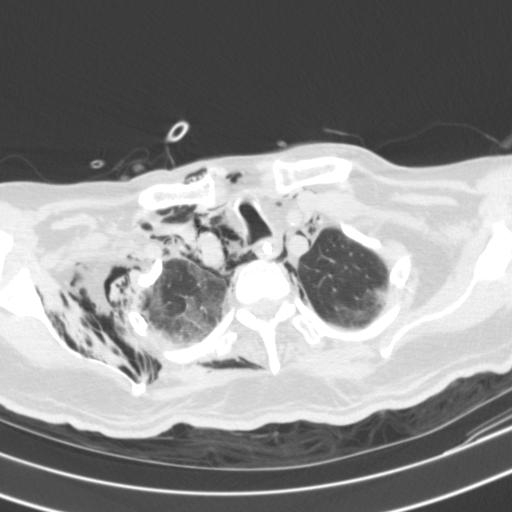
[im 356/407  lung]
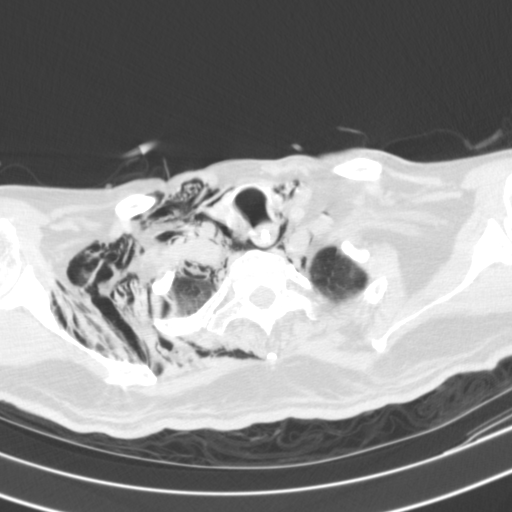
[im 390/407  lung]
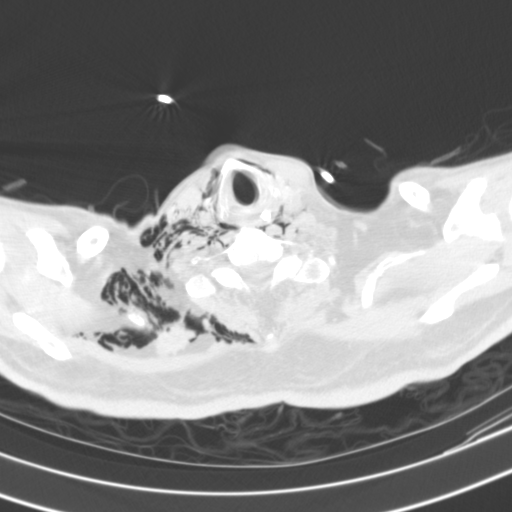

[15 of 30 positions shown; findings below may reference images not displayed]

FINDINGS: Cardiovascular: No significant vascular findings. Cardiomegaly.
Three-vessel coronary artery disease. No pericardial effusion.

Mediastinum/Nodes: Extensive, new pneumomediastinum. No enlarged
mediastinal, hilar, or axillary lymph nodes. Thyroid gland, trachea,
and esophagus demonstrate no significant findings.

Lungs/Pleura: There is diffuse, extensive bilateral ground-glass
pulmonary opacity with a slight basilar predominant distribution,
slightly worsened in comparison to prior CT dated 10/26/2018. No
pleural effusion or pneumothorax.

Upper Abdomen: No acute abnormality.

Musculoskeletal: Extensive subcutaneous emphysema about the right
neck and upper chest.
IMPRESSION: 1. There is diffuse, extensive bilateral ground-glass pulmonary
opacity with a slight basilar predominant distribution, slightly
worsened in comparison to prior CT dated 10/26/2018. Although
nonspecific this is generally in keeping with known diagnosis of
[id number].

2. Extensive, new pneumomediastinum as well this subcutaneous
emphysema about right neck and upper chest, possibly related to
barotrauma.

3.  Coronary artery disease.
